# Patient Record
Sex: Female | Born: 1950 | Hispanic: No | Marital: Single | Smoking: Never smoker
Health system: Southern US, Community
[De-identification: ages and names within clinical notes are randomized; demographics above are authoritative.]

## PROBLEM LIST (undated history)

## (undated) DIAGNOSIS — I10 Essential (primary) hypertension: Secondary | ICD-10-CM

## (undated) DIAGNOSIS — E079 Disorder of thyroid, unspecified: Secondary | ICD-10-CM

## (undated) DIAGNOSIS — E7800 Pure hypercholesterolemia, unspecified: Secondary | ICD-10-CM

## (undated) HISTORY — DX: Essential (primary) hypertension: I10

## (undated) HISTORY — DX: Disorder of thyroid, unspecified: E07.9

## (undated) HISTORY — DX: Pure hypercholesterolemia, unspecified: E78.00

---

## 2007-12-05 ENCOUNTER — Other Ambulatory Visit: Admission: RE | Admit: 2007-12-05 | Discharge: 2007-12-05 | Payer: Self-pay | Admitting: General Surgery

## 2008-09-13 ENCOUNTER — Emergency Department (HOSPITAL_COMMUNITY): Admission: EM | Admit: 2008-09-13 | Discharge: 2008-09-13 | Payer: Self-pay | Admitting: Emergency Medicine

## 2013-03-30 ENCOUNTER — Encounter: Payer: Self-pay | Admitting: Podiatry

## 2013-03-30 ENCOUNTER — Ambulatory Visit (INDEPENDENT_AMBULATORY_CARE_PROVIDER_SITE_OTHER): Payer: BC Managed Care – PPO

## 2013-03-30 ENCOUNTER — Ambulatory Visit (INDEPENDENT_AMBULATORY_CARE_PROVIDER_SITE_OTHER): Payer: BC Managed Care – PPO | Admitting: Podiatry

## 2013-03-30 VITALS — BP 124/76 | HR 60 | Resp 16 | Ht 64.0 in | Wt 125.0 lb

## 2013-03-30 DIAGNOSIS — M79609 Pain in unspecified limb: Secondary | ICD-10-CM

## 2013-03-30 DIAGNOSIS — M79671 Pain in right foot: Secondary | ICD-10-CM

## 2013-03-30 DIAGNOSIS — M722 Plantar fascial fibromatosis: Secondary | ICD-10-CM

## 2013-03-30 DIAGNOSIS — L608 Other nail disorders: Secondary | ICD-10-CM

## 2013-03-30 NOTE — Patient Instructions (Signed)
Plantar Fasciitis (Heel Spur Syndrome) with Rehab The plantar fascia is a fibrous, ligament-like, soft-tissue structure that spans the bottom of the foot. Plantar fasciitis is a condition that causes pain in the foot due to inflammation of the tissue. SYMPTOMS   Pain and tenderness on the underneath side of the foot.  Pain that worsens with standing or walking. CAUSES  Plantar fasciitis is caused by irritation and injury to the plantar fascia on the underneath side of the foot. Common mechanisms of injury include:  Direct trauma to bottom of the foot.  Damage to a small nerve that runs under the foot where the main fascia attaches to the heel bone.  Stress placed on the plantar fascia due to bone spurs. RISK INCREASES WITH:   Activities that place stress on the plantar fascia (running, jumping, pivoting, or cutting).  Poor strength and flexibility.  Improperly fitted shoes.  Tight calf muscles.  Flat feet.  Failure to warm-up properly before activity.  Obesity. PREVENTION  Warm up and stretch properly before activity.  Allow for adequate recovery between workouts.  Maintain physical fitness:  Strength, flexibility, and endurance.  Cardiovascular fitness.  Maintain a health body weight.  Avoid stress on the plantar fascia.  Wear properly fitted shoes, including arch supports for individuals who have flat feet. PROGNOSIS  If treated properly, then the symptoms of plantar fasciitis usually resolve without surgery. However, occasionally surgery is necessary. RELATED COMPLICATIONS   Recurrent symptoms that may result in a chronic condition.  Problems of the lower back that are caused by compensating for the injury, such as limping.  Pain or weakness of the foot during push-off following surgery.  Chronic inflammation, scarring, and partial or complete fascia tear, occurring more often from repeated injections. TREATMENT  Treatment initially involves the use of  ice and medication to help reduce pain and inflammation. The use of strengthening and stretching exercises may help reduce pain with activity, especially stretches of the Achilles tendon. These exercises may be performed at home or with a therapist. Your caregiver may recommend that you use heel cups of arch supports to help reduce stress on the plantar fascia. Occasionally, corticosteroid injections are given to reduce inflammation. If symptoms persist for greater than 6 months despite non-surgical (conservative), then surgery may be recommended.  MEDICATION   If pain medication is necessary, then nonsteroidal anti-inflammatory medications, such as aspirin and ibuprofen, or other minor pain relievers, such as acetaminophen, are often recommended.  Do not take pain medication within 7 days before surgery.  Prescription pain relievers may be given if deemed necessary by your caregiver. Use only as directed and only as much as you need.  Corticosteroid injections may be given by your caregiver. These injections should be reserved for the most serious cases, because they may only be given a certain number of times. HEAT AND COLD  Cold treatment (icing) relieves pain and reduces inflammation. Cold treatment should be applied for 10 to 15 minutes every 2 to 3 hours for inflammation and pain and immediately after any activity that aggravates your symptoms. Use ice packs or massage the area with a piece of ice (ice massage).  Heat treatment may be used prior to performing the stretching and strengthening activities prescribed by your caregiver, physical therapist, or athletic trainer. Use a heat pack or soak the injury in warm water. SEEK IMMEDIATE MEDICAL CARE IF:  Treatment seems to offer no benefit, or the condition worsens.  Any medications produce adverse side effects. EXERCISES RANGE   OF MOTION (ROM) AND STRETCHING EXERCISES - Plantar Fasciitis (Heel Spur Syndrome) These exercises may help you  when beginning to rehabilitate your injury. Your symptoms may resolve with or without further involvement from your physician, physical therapist or athletic trainer. While completing these exercises, remember:   Restoring tissue flexibility helps normal motion to return to the joints. This allows healthier, less painful movement and activity.  An effective stretch should be held for at least 30 seconds.  A stretch should never be painful. You should only feel a gentle lengthening or release in the stretched tissue. RANGE OF MOTION - Toe Extension, Flexion  Sit with your right / left leg crossed over your opposite knee.  Grasp your toes and gently pull them back toward the top of your foot. You should feel a stretch on the bottom of your toes and/or foot.  Hold this stretch for __________ seconds.  Now, gently pull your toes toward the bottom of your foot. You should feel a stretch on the top of your toes and or foot.  Hold this stretch for __________ seconds. Repeat __________ times. Complete this stretch __________ times per day.  RANGE OF MOTION - Ankle Dorsiflexion, Active Assisted  Remove shoes and sit on a chair that is preferably not on a carpeted surface.  Place right / left foot under knee. Extend your opposite leg for support.  Keeping your heel down, slide your right / left foot back toward the chair until you feel a stretch at your ankle or calf. If you do not feel a stretch, slide your bottom forward to the edge of the chair, while still keeping your heel down.  Hold this stretch for __________ seconds. Repeat __________ times. Complete this stretch __________ times per day.  STRETCH  Gastroc, Standing  Place hands on wall.  Extend right / left leg, keeping the front knee somewhat bent.  Slightly point your toes inward on your back foot.  Keeping your right / left heel on the floor and your knee straight, shift your weight toward the wall, not allowing your back to  arch.  You should feel a gentle stretch in the right / left calf. Hold this position for __________ seconds. Repeat __________ times. Complete this stretch __________ times per day. STRETCH  Soleus, Standing  Place hands on wall.  Extend right / left leg, keeping the other knee somewhat bent.  Slightly point your toes inward on your back foot.  Keep your right / left heel on the floor, bend your back knee, and slightly shift your weight over the back leg so that you feel a gentle stretch deep in your back calf.  Hold this position for __________ seconds. Repeat __________ times. Complete this stretch __________ times per day. STRETCH  Gastrocsoleus, Standing  Note: This exercise can place a lot of stress on your foot and ankle. Please complete this exercise only if specifically instructed by your caregiver.   Place the ball of your right / left foot on a step, keeping your other foot firmly on the same step.  Hold on to the wall or a rail for balance.  Slowly lift your other foot, allowing your body weight to press your heel down over the edge of the step.  You should feel a stretch in your right / left calf.  Hold this position for __________ seconds.  Repeat this exercise with a slight bend in your right / left knee. Repeat __________ times. Complete this stretch __________ times per day.    STRENGTHENING EXERCISES - Plantar Fasciitis (Heel Spur Syndrome)  These exercises may help you when beginning to rehabilitate your injury. They may resolve your symptoms with or without further involvement from your physician, physical therapist or athletic trainer. While completing these exercises, remember:   Muscles can gain both the endurance and the strength needed for everyday activities through controlled exercises.  Complete these exercises as instructed by your physician, physical therapist or athletic trainer. Progress the resistance and repetitions only as guided. STRENGTH - Towel  Curls  Sit in a chair positioned on a non-carpeted surface.  Place your foot on a towel, keeping your heel on the floor.  Pull the towel toward your heel by only curling your toes. Keep your heel on the floor.  If instructed by your physician, physical therapist or athletic trainer, add ____________________ at the end of the towel. Repeat __________ times. Complete this exercise __________ times per day. STRENGTH - Ankle Inversion  Secure one end of a rubber exercise band/tubing to a fixed object (table, pole). Loop the other end around your foot just before your toes.  Place your fists between your knees. This will focus your strengthening at your ankle.  Slowly, pull your big toe up and in, making sure the band/tubing is positioned to resist the entire motion.  Hold this position for __________ seconds.  Have your muscles resist the band/tubing as it slowly pulls your foot back to the starting position. Repeat __________ times. Complete this exercises __________ times per day.  Document Released: 04/02/2005 Document Revised: 06/25/2011 Document Reviewed: 07/15/2008 Exit[employer] Patient Information 2014 [employer]. Plantar Fasciitis Plantar fasciitis is a common condition that causes foot pain. It is soreness (inflammation) of the band of tough fibrous tissue on the bottom of the foot that runs from the heel bone (calcaneus) to the ball of the foot. The cause of this soreness may be from excessive standing, poor fitting shoes, running on hard surfaces, being overweight, having an abnormal walk, or overuse (this is common in runners) of the painful foot or feet. It is also common in aerobic exercise dancers and ballet dancers. SYMPTOMS  Most people with plantar fasciitis complain of:  Severe pain in the morning on the bottom of their foot especially when taking the first steps out of bed. This pain recedes after a few minutes of walking.  Severe pain is experienced also during walking  following a long period of inactivity.  Pain is worse when walking barefoot or up stairs DIAGNOSIS   Your caregiver will diagnose this condition by examining and feeling your foot.  Special tests such as X-rays of your foot, are usually not needed. PREVENTION   Consult a sports medicine professional before beginning a new exercise program.  Walking programs offer a good workout. With walking there is a lower chance of overuse injuries common to runners. There is less impact and less jarring of the joints.  Begin all new exercise programs slowly. If problems or pain develop, decrease the amount of time or distance until you are at a comfortable level.  Wear good shoes and replace them regularly.  Stretch your foot and the heel cords at the back of the ankle (Achilles tendon) both before and after exercise.  Run or exercise on even surfaces that are not hard. For example, asphalt is better than pavement.  Do not run barefoot on hard surfaces.  If using a treadmill, vary the incline.  Do not continue to workout if you have foot or joint   problems. Seek professional help if they do not improve. HOME CARE INSTRUCTIONS   Avoid activities that cause you pain until you recover.  Use ice or cold packs on the problem or painful areas after working out.  Only take over-the-counter or prescription medicines for pain, discomfort, or fever as directed by your caregiver.  Soft shoe inserts or athletic shoes with air or gel sole cushions may be helpful.  If problems continue or become more severe, consult a sports medicine caregiver or your own health care provider. Cortisone is a potent anti-inflammatory medication that may be injected into the painful area. You can discuss this treatment with your caregiver. MAKE SURE YOU:   Understand these instructions.  Will watch your condition.  Will get help right away if you are not doing well or get worse. Document Released: 12/26/2000 Document  Revised: 06/25/2011 Document Reviewed: 02/25/2008 Exit[employer] Patient Information 2014 [employer].  

## 2013-03-30 NOTE — Progress Notes (Signed)
   Subjective:    Patient ID: Haley Lucas, female    DOB: [birth year], 62 y.o.   MRN: [id number]  HPI Comments: Fungus on both great toenails and pain in heel   N tender and sore , discoloring in toenails  L right plantar heel , both great toenails D year                         2 years  O gradual  C about the same  A when on it ,it hurts ,  T no treatment, no treatment for nails   Foot Pain  pt did not bring in medication list, she currently taking meds for thyroid and high blood pressure and also cholesterol    Review of Systems  Neurological:       Vertigo   All other systems reviewed and are negative.       Objective:   Physical Exam: I have reviewed her past medical history medications allergies surgical history social history family history and review of systems. Vital signs are stable she is alert and oriented x3.  Pulses are strongly palpable bilateral +2/4 DPPT capillary fill time to digits one through 5 of the bilateral foot is noted to be immediate. Neurologic sensorium is intact per Semmes-Weinstein monofilament. Deep tendon reflexes are brisk and equal bilateral. Muscle strength + over 5 dorsiflexors plantar flexors inverters everters all intrinsic musculature is intact. Orthopedic evaluation demonstrates mild tenderness on palpation right heel. Rectus foot type bilateral. Cutaneous evaluation demonstrates supple well hydrated cutis no erythema edema saline is drainage or odor. Hallux nail plates bilateral do demonstrate discoloration and thickening the medial margins. Radiographic evaluation right foot demonstrates soft tissue increase in density at the plantar fascial calcaneal insertion site with a small plantar distally oriented calcaneal heel spur.        Assessment & Plan:  Assessment: Mild plantar fasciitis right. Nail dystrophy hallux bilateral rule out onychomycosis.  Plan: Offered her an injection today which she declined. I also sample of her nail and skin  from each hallux is sent out from a mycology study.

## 2013-04-23 ENCOUNTER — Telehealth: Payer: Self-pay | Admitting: Podiatry

## 2013-04-23 ENCOUNTER — Encounter: Payer: Self-pay | Admitting: Podiatry

## 2013-04-23 NOTE — Telephone Encounter (Signed)
Left patient a message to schedule an appointment for her lab results. SD

## 2013-05-04 ENCOUNTER — Encounter: Payer: Self-pay | Admitting: Podiatry

## 2013-05-04 ENCOUNTER — Ambulatory Visit (INDEPENDENT_AMBULATORY_CARE_PROVIDER_SITE_OTHER): Payer: BC Managed Care – PPO | Admitting: Podiatry

## 2013-05-04 VITALS — BP 104/65 | HR 64 | Resp 16

## 2013-05-04 DIAGNOSIS — B351 Tinea unguium: Secondary | ICD-10-CM

## 2013-05-04 DIAGNOSIS — Z79899 Other long term (current) drug therapy: Secondary | ICD-10-CM

## 2013-05-04 NOTE — Progress Notes (Signed)
Result of the nail.  Objective: Vital signs are stable she is alert and oriented x3. She demonstrates to me the lateral border of her hallux nail right does demonstrate onychomycosis. I did discuss with her in great detail today her pathology report did come back positive for fungus as well as molds. We discussed the oral medications as well as topicals.  Assessment: Onychomycosis.  Plan: We discussed in great detail today he oral and the oral therapies. She declines any therapy at all.

## 2017-06-26 ENCOUNTER — Other Ambulatory Visit: Payer: Self-pay

## 2017-06-26 ENCOUNTER — Encounter: Payer: Self-pay | Admitting: Physical Therapy

## 2017-06-26 ENCOUNTER — Encounter: Payer: Self-pay | Admitting: Occupational Therapy

## 2017-06-26 ENCOUNTER — Ambulatory Visit: Payer: Medicare Other | Admitting: Occupational Therapy

## 2017-06-26 ENCOUNTER — Ambulatory Visit: Payer: Medicare Other | Attending: Family Medicine | Admitting: Physical Therapy

## 2017-06-26 DIAGNOSIS — R2681 Unsteadiness on feet: Secondary | ICD-10-CM | POA: Insufficient documentation

## 2017-06-26 DIAGNOSIS — H543 Unqualified visual loss, both eyes: Secondary | ICD-10-CM | POA: Diagnosis present

## 2017-06-26 DIAGNOSIS — M6281 Muscle weakness (generalized): Secondary | ICD-10-CM

## 2017-06-26 DIAGNOSIS — R278 Other lack of coordination: Secondary | ICD-10-CM | POA: Diagnosis present

## 2017-06-26 DIAGNOSIS — R262 Difficulty in walking, not elsewhere classified: Secondary | ICD-10-CM

## 2017-06-26 NOTE — Therapy (Signed)
[facility] [facility] [address] [place], [place], [address] Phone: [phone]   Fax:  [phone]  Physical Therapy Evaluation  Patient Details  Name: Haley LemonsHyun S Iversen MRN: [id number] Date of Birth: 06/14/1950 Referring Provider: Marshell GarfinkelO, SARAH J    Encounter Date: 06/26/2017  PT End of Session - 06/26/17 0941    Visit Number  1    Number of Visits  17    Date for PT Re-Evaluation  08/21/17    PT Start Time  0940    PT Stop Time  1030    PT Time Calculation (min)  50 min    Equipment Utilized During Treatment  Gait belt    Activity Tolerance  Patient tolerated treatment well    Behavior During Therapy  [facility] for tasks assessed/performed       Past Medical History:  Diagnosis Date  . High cholesterol   . Hypertension   . Thyroid disease     History reviewed. No pertinent surgical history.  There were no vitals filed for this visit.   Subjective Assessment - 06/26/17 0943    Subjective  Patient had surgery Feb 26, 2018.     Pertinent History  She had a hughes flap to her eye right eye feb 18th at [facility]. She was in the hospital for a month from the first surgey and was discharged Dec 11th. She had PT and OT for  2 weeks and then [facility] PT, OT and ST.  Tha ended christmas. She was not using a Assistive devie prior to surgery. She has weakness in her arms and she is not able to walk her normal distances.     Limitations  Standing    How long can you stand comfortably?  10 mins    Patient Stated Goals  to be able to walk and stand for longer periods of time, and improve balance.     Currently in Pain?  No/denies    Multiple Pain Sites  No         [facility] PT Assessment - 06/26/17 0936      Assessment   Medical Diagnosis  Acoustic Neuroma    Referring Provider  [facility]     Onset Date/Surgical Date  02/26/17    Hand Dominance  Right    Next MD Visit  07/2017    Prior Therapy  [facility], inpatient rehab      Precautions   Precautions  None      Restrictions   Weight Bearing Restrictions  No      Balance Screen   Has the patient fallen in the past 6 months  No    Has the patient had a decrease in activity level because of a fear of falling?   Yes    Is the patient reluctant to leave their home because of a fear of falling?   No      Home Public house managernvironment   Living Environment  Private residence    Living Arrangements  Spouse/significant other    Available Help at Discharge  Family    Type of Home  House    Home Access  Stairs to enter    Entrance Stairs-Number of Steps  3    Entrance Stairs-Rails  Right    Home Layout  One level    Home Equipment  Walker - 2 wheels;Cane - single point      Prior Function   Level of Independence  Independent  Vocation  Retired    Leisure  Advertising account planner Status  Within [employer] for tasks assessed    Attention  Focused       PAIN: shoulder pain bilaterally during bed mobility   POSTURE: WNL   PROM/AROM: WNL  STRENGTH:  Graded on a 0-5 scale Muscle Group Left Right                          Hip Flex -3/5 -3/5  Hip Abd -3/5 -3/5  Hip Add -3/5 -3/5  Hip Ext -3/5 -3/5  Hip IR/ER 3/5 3/5  Knee Flex 5/5 5/5  Knee Ext 5/5 5/5  Ankle DF 5/5 5/5  Ankle PF 5/5 5/5   SENSATION: numbness in right side of face and tongue   FUNCTIONAL MOBILITY: Difficulty with rolling due to UE weakness and UE pain, modified independent   BALANCE: Static Standing Balance  Normal Able to maintain standing balance against maximal resistance   Good Able to maintain standing balance against moderate resistance   Good-/Fair+ Able to maintain standing balance against minimal resistance x  Fair Able to stand unsupported without UE support and without LOB for 1-2 min   Fair- Requires Min A and UE support to maintain standing without loss of balance   Poor+ Requires mod A and UE support to maintain standing without loss of balance   Poor  Requires max A and UE support to maintain standing balance without loss    Standing Dynamic Balance  Normal Stand independently unsupported, able to weight shift and cross midline maximally   Good Stand independently unsupported, able to weight shift and cross midline moderately   Good-/Fair+ Stand independently unsupported, able to weight shift across midline minimally x  Fair Stand independently unsupported, weight shift, and reach ipsilaterally, loss of balance when crossing midline   Poor+ Able to stand with Min A and reach ipsilaterally, unable to weight shift   Poor Able to stand with Mod A and minimally reach ipsilaterally, unable to cross midline.       GAIT: Patient ambulates with slow gait speed and path deviation with turns or head turns; no assistive device 1 step ambulation for ascending and descending steps  OUTCOME MEASURES: TEST Outcome Interpretation  5 times sit<>stand 29.69sec >83 yo, >15 sec indicates increased risk for falls  10 meter walk test      1.12           m/s <1.0 m/s indicates increased risk for falls; limited community ambulator  Timed up and Go  19.67               sec <14 sec indicates increased risk for  falls  6 minute walk test    880            Feet 1000 feet is community ambulator            Treatment Standing on foam with head turns x  1 min Standing on level surface with head turns and narrow base of support x 1 min Standing on foam and level surfaces in parallel bars with balance challenges and CGA x 2 mins Safety with transfers during various heights of sit to stand and education for mobility Reviewed goals and plan of care and balance deficits and outcome measures.  PT Education - 06/26/17 0940    Education provided  Yes    Education Details  plan of care    Person(s) Educated  Patient    Methods  Explanation    Comprehension  Verbalized understanding       PT Short Term Goals - 06/26/17 1029       PT SHORT TERM GOAL #1   Title  Patient will be independent in home exercise program to improve strength/mobility for better functional independence with ADLs.    Time  4    Period  Weeks    Status  New    Target Date  07/24/17      PT SHORT TERM GOAL #2   Title  Patient (> 36 years old) will complete five times sit to stand test in < 15 seconds indicating an increased LE strength and improved balance.    Time  4    Period  Weeks    Target Date  07/24/17        PT Long Term Goals - 06/26/17 1030      PT LONG TERM GOAL #1   Title  Patient will increase six minute walk test distance to >1000 for progression to community ambulator and improve gait ability    Time  8    Period  Weeks    Status  New    Target Date  08/21/17      PT LONG TERM GOAL #2   Title  Patient will increase BLE gross strength to 4+/5 as to improve functional strength for independent gait, increased standing tolerance and increased ADL ability.    Time  8    Period  Weeks    Status  New    Target Date  08/21/17      PT LONG TERM GOAL #3   Title  Patient will ascend/descend 4 stairs without rail assist independently without loss of balance to improve ability to get in/out of home.     Time  8    Period  Weeks    Status  New    Target Date  08/21/17      PT LONG TERM GOAL #4   Title  Patient will reduce timed up and go to <11 seconds to reduce fall risk and demonstrate improved transfer/gait ability.    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 She has BLE hip weakness, decreased gait speed, decreased static and dynamic standing balance. She has decreased outcome measures indicating a falls risk. She has decreased hearing in right ear, numbness in right side of face and tongue. She will benefit from skilled PT to improve strength, balance and  gait to return to prior level of function.    Clinical Presentation  Stable    Clinical Decision Making  Low    Rehab Potential  Good    PT Frequency  2x / week    PT Duration  8 weeks    PT Treatment/Interventions  Gait training;Therapeutic exercise;Therapeutic activities;Stair training;Balance training;Neuromuscular re-education;Patient/family education;Manual techniques;Aquatic Therapy;Moist Heat;Electrical Stimulation    PT Next Visit Plan  balance and therapeutic exercise for hip weakness    Consulted and Agree with Plan of Care  Patient;Family member/caregiver  Patient will benefit from skilled therapeutic intervention in order to improve the following deficits and impairments:  Abnormal gait, Decreased balance, Decreased endurance, Decreased mobility, Difficulty walking, Decreased knowledge of precautions, Decreased activity tolerance, Decreased coordination, Decreased safety awareness, Decreased strength, Impaired flexibility, Postural dysfunction  Visit Diagnosis: Unsteadiness on feet  Difficulty in walking, not elsewhere classified  Muscle weakness (generalized)     Problem List There are no active problems to display for this patient.   [address], [place] DPT 06/26/2017, 11:30 AM  [place] [facility] MAIN [facility] SERVICES [address] [place], [place], [address] Phone: [phone]   Fax:  [phone]  Name: RAMON BRANT MRN: [id number] Date of Birth: [birth year]

## 2017-06-26 NOTE — Therapy (Signed)
[place] [facility] MAIN [facility] SERVICES [address] [place], [place], [address] Phone: [phone]   Fax:  [phone]  Occupational Therapy Evaluation  Patient Details  Name: Haley Lucas MRN: [id number] Date of Birth: 23-Oct-1950 Referring Provider: Marshell Garfinkel    Encounter Date: 06/26/2017  OT End of Session - 06/26/17 1100    Visit Number  1    Number of Visits  24    Date for OT Re-Evaluation  09/18/17    OT Start Time  0832    OT Stop Time  0937    OT Time Calculation (min)  65 min    Activity Tolerance  Patient tolerated treatment well    Behavior During Therapy  [facility] for tasks assessed/performed       Past Medical History:  Diagnosis Date  . High cholesterol   . Hypertension   . Thyroid disease     History reviewed. No pertinent surgical history.  There were no vitals filed for this visit.  Subjective Assessment - 06/26/17 1048    Subjective   Pt. was present with her daughter, an interpreter, and her husband.    Pertinent History  Pt is a 67 y.o. 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 Pt. received inpatient rehabilitation services followed by home health services. Pt. had surgery to repair a right drooping eyelid on February 19th., 2019. Pt. is ready for outpatient OT services.    Patient Stated Goals  OT services    Currently in Pain?  No/denies    Multiple Pain Sites  No        [facility] OT Assessment - 06/26/17 0001      Assessment   Medical Diagnosis  Acoustic Neuroma    Referring Provider  Dr. Minerva Areola    Onset Date/Surgical Date  02/26/17    Next MD Visit  07/2017      Balance Screen   Has the patient fallen in the past 6 months  No    Has the patient had a decrease in activity level because of a fear of falling?   No    Is the patient reluctant to leave their home because of a fear of falling?   No      Home  Environment   Family/patient expects to be discharged to:  Private residence     Living Arrangements  Spouse/significant other    Type of Home  House    Home Layout  One level    Bathroom Accessibility  Yes    Home Equipment  [place] - 2 wheels;[place] - single point      Prior Function   Level of Independence  Independent    Vocation  -- Runner, broadcasting/film/video    Leisure  gardening      ADL   Eating/Feeding  Independent difficulty with cutting meat    Grooming  Independent    Lower Body Bathing  Increased time    Financial trader -  Geneticist, molecular  Independent      IADL   Shopping  Completely unable to shop    Light Housekeeping  Needs help with all home maintenance tasks    Meal Prep  Able to complete simple warm meal prep    Medication Management  Is responsible for taking medication  in correct dosages at correct time    Financial Management  Requires assistance      Written Expression   Dominant Hand  Right      Vision - History   Baseline Vision  Wears glasses all the time    Visual History  Cataracts    Additional Comments  Pt. had surgery for right eyelid droop 06/04/2017      Vision Assessment   Vision Assessment  Vision impaired  _ to be further tested in functional context    Diplopia Assessment  Disappears with one eye closed    Patient has diffculty with activities due to visual impairment  -- Reading      Activity Tolerance   Activity Tolerance  Tolerates 10-20 min activity with multiple rests      Cognition   Overall Cognitive Status  Impaired/Different from baseline slower    Memory  Impaired      Sensation   Light Touch  Appears Intact    Proprioception  Appears Intact      Coordination   Right 9 Hole Peg Test  47 Pt. required frequent cues proper technique    Left 9 Hole Peg Test  28 28      Strength   Overall Strength Comments  BUE strength: Shoulder flexion 4-/5, abduction 3+/5, elbow flexion extension 4/5,  wrist extension 4/5       Hand Function   Right Hand Grip (lbs)  36#    Right Hand Lateral Pinch  12 lbs    Right Hand 3 Point Pinch  7 lbs    Left Hand Grip (lbs)  34#    Left Hand Lateral Pinch  11 lbs    Left 3 point pinch  9 lbs                      OT Education - 06/26/17 1153    Education provided  Yes    Education Details  UE strength, and coordination skills    Person(s) Educated  Patient    Methods  Explanation    Comprehension  Verbalized understanding          OT Long Term Goals - 06/26/17 1205      OT LONG TERM GOAL #1   Title  Pt. will increase UE strength by 2 mm grades to assist with ADLs, and IADL    Baseline  Eval: Limited BUE strength    Time  12    Period  Weeks    Status  New    Target Date  09/18/17      OT LONG TERM GOAL #2   Title  Pt. will improve right hand [facility] skills by 3 sec. to be able to manipulate ADL items.    Baseline  Eval: Right: 47 sec.    Time  12    Period  Weeks    Status  New    Target Date  09/18/17      OT LONG TERM GOAL #3   Title  Pt. will demonstrate visual compensatory strategies 100% of the time during ADLs, and IADLs.    Baseline  EVal: Pt. unable    Time  12    Period  Weeks    Status  New    Target Date  09/18/17      OT LONG TERM GOAL #4   Title  Pt. will demonstrate cognitive compensatory strategies 100% of the time during ADLs, and IADLs.  Baseline  Eval: Pt. is unable    Time  12    Period  Weeks    Status  New    Target Date  09/18/17      OT LONG TERM GOAL #5   Title  Pt. will complete IADL, home management tasks with Supervision.     Baseline  Eval: Dependent    Time  12    Period  Weeks    Status  New    Target Date  09/18/17            Plan - 06/26/17 1101    Clinical Impression Statement  Pt. is a 67 y.o. 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 had surgery for eyelid droop in February of 2019. Pt. presents with weakness, limited  functional mobility, limited balance, pain in bilateral shoulders with changes in position at night, impaired vision, and cogniitve limitations which limit her ability to complete ADL, and IADL functioning. Pt. will benefit from skilled OT services to work on improving  UE strength, and coordination skills, and provide pt. education about visual, and cognitive compensatory strategies for improved functional performance, and engagement in functional ADL, and IADL tasks.    Occupational Profile and client history currently impacting functional performance  Pt. 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    Occupational performance deficits (Please refer to evaluation for details):  ADL's;IADL's    Rehab Potential  Good    Current Impairments/barriers affecting progress:  Positive indicators: age, family support, motivation, Negative indicators: multiple comorbidities.    OT Frequency  2x / week    OT Duration  12 weeks    OT Treatment/Interventions  Self-care/ADL training;Neuromuscular education;Therapeutic activities;Cognitive remediation/compensation;Passive range of motion;DME and/or AE instruction;Patient/family education;Energy conservation;Therapeutic exercise    Clinical Decision Making  Multiple treatment options, significant modification of task necessary    Consulted and Agree with Plan of Care  Patient       Patient will benefit from skilled therapeutic intervention in order to improve the following deficits and impairments:  Pain, Impaired UE functional use, Decreased knowledge of precautions, Decreased cognition, Impaired tone, Decreased strength, Decreased endurance, Decreased activity tolerance, Decreased knowledge of use of DME, Decreased balance  Visit Diagnosis: Muscle weakness (generalized)  Other lack of coordination  Low vision, both eyes    Problem List There are no active problems to display for this patient.   [name], MS,  OTR/L 06/26/2017, 12:12 PM  [facility] MAIN [facility] SERVICES [address] [place], [place], [address] Phone: [phone]   Fax:  [phone]  Name: Haley Lucas MRN: [id number] Date of Birth: [birth year]

## 2017-07-01 ENCOUNTER — Encounter: Payer: Self-pay | Admitting: Occupational Therapy

## 2017-07-01 ENCOUNTER — Ambulatory Visit: Payer: Medicare Other | Admitting: Physical Therapy

## 2017-07-01 ENCOUNTER — Ambulatory Visit: Payer: Medicare Other | Admitting: Occupational Therapy

## 2017-07-01 ENCOUNTER — Encounter: Payer: Self-pay | Admitting: Physical Therapy

## 2017-07-01 DIAGNOSIS — R262 Difficulty in walking, not elsewhere classified: Secondary | ICD-10-CM

## 2017-07-01 DIAGNOSIS — M6281 Muscle weakness (generalized): Secondary | ICD-10-CM

## 2017-07-01 DIAGNOSIS — R2681 Unsteadiness on feet: Secondary | ICD-10-CM

## 2017-07-01 DIAGNOSIS — H543 Unqualified visual loss, both eyes: Secondary | ICD-10-CM

## 2017-07-01 DIAGNOSIS — R278 Other lack of coordination: Secondary | ICD-10-CM

## 2017-07-01 NOTE — Therapy (Signed)
[place] [facility] MAIN [facility] SERVICES [address] [place], [place], [address] Phone: [phone]   Fax:  [phone]  Occupational Therapy Treatment  Patient Details  Name: Haley Lucas MRN: [id number] Date of Birth: 1950-11-10 Referring Provider: Marshell Garfinkel    Encounter Date: 07/01/2017  OT End of Session - 07/01/17 0852    Visit Number  2    Number of Visits  24    Date for OT Re-Evaluation  09/18/17    OT Start Time  0845    OT Stop Time  0930    OT Time Calculation (min)  45 min    Activity Tolerance  Patient tolerated treatment well    Behavior During Therapy  [facility] for tasks assessed/performed       Past Medical History:  Diagnosis Date  . High cholesterol   . Hypertension   . Thyroid disease     History reviewed. No pertinent surgical history.  There were no vitals filed for this visit.  Subjective Assessment - 07/01/17 0851    Pertinent History  Pt is a 67 y.o. 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 Pt. received inpatient rehabilitation services followed by home health services. Pt. had surgery to repair a right drooping eyelid on February 19th., 2019. Pt. is ready for outpatient OT services.    Patient Stated Goals  OT services    Currently in Pain?  Yes    Pain Score  0-No pain      OT TREATMENT    Therapeutic Exercise:  Pt. Worked on BUE there. Ex. With red theraband for bilateral elbow flexion and extension. 1-2 sets 20 reps. 2# dumbbell ex. for elbow flexion and extension, forearm supination/pronation, wrist flexion/extension, and radial deviation. Pt. requires rest breaks and verbal cues for proper technique. Pt. performed gross gripping with grip strengthener. Pt. worked on sustaining grip while grasping pegs and reaching at various heights. Gripper was placed in the resistive slot with the white resistive spring.  Selfcare:  Pt. education was provided about positioning for nighttime  secondary to bilateral shoulder pain at night only.                           OT Education - 07/01/17 [id number]    Education provided  Yes    Education Details  positioning for sleeping    Person(s) Educated  Patient    Methods  Explanation    Comprehension  Verbalized understanding;Returned demonstration          OT Long Term Goals - 06/26/17 1205      OT LONG TERM GOAL #1   Title  Pt. will increase UE strength by 2 mm grades to assist with ADLs, and IADL    Baseline  Eval: Limited BUE strength    Time  12    Period  Weeks    Status  New    Target Date  09/18/17      OT LONG TERM GOAL #2   Title  Pt. will improve right hand [facility] skills by 3 sec. to be able to manipulate ADL items.    Baseline  Eval: Right: 47 sec.    Time  12    Period  Weeks    Status  New    Target Date  09/18/17      OT LONG TERM GOAL #3   Title  Pt. will demonstrate visual compensatory strategies  100% of the time during ADLs, and IADLs.    Baseline  EVal: Pt. unable    Time  12    Period  Weeks    Status  New    Target Date  09/18/17      OT LONG TERM GOAL #4   Title  Pt. will demonstrate cognitive compensatory strategies 100% of the time during ADLs, and IADLs.    Baseline  Eval: Pt. is unable    Time  12    Period  Weeks    Status  New    Target Date  09/18/17      OT LONG TERM GOAL #5   Title  Pt. will complete IADL, home management tasks with Supervision.     Baseline  Eval: Dependent    Time  12    Period  Weeks    Status  New    Target Date  09/18/17            Plan - 07/01/17 0853    Clinical Impression Statement  Pt. reports she is feeling better overall. Pt. reports no pain during the treatment session, however has pain at night in her bilateral shoulders. Pt. education was provided about positioning.  Pt. reports vision is better, however her face feels very heavy on the right side.    Occupational Profile and client history currently impacting  functional performance  Pt. 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    Occupational performance deficits (Please refer to evaluation for details):  ADL's;IADL's    Rehab Potential  Good    Current Impairments/barriers affecting progress:  Positive indicators: age, family support, motivation, Negative indicators: multiple comorbidities.    OT Frequency  2x / week    OT Duration  12 weeks    OT Treatment/Interventions  Self-care/ADL training;Neuromuscular education;Therapeutic activities;Cognitive remediation/compensation;Passive range of motion;DME and/or AE instruction;Patient/family education;Energy conservation;Therapeutic exercise    Clinical Decision Making  Multiple treatment options, significant modification of task necessary    Consulted and Agree with Plan of Care  Patient       Patient will benefit from skilled therapeutic intervention in order to improve the following deficits and impairments:  Pain, Impaired UE functional use, Decreased knowledge of precautions, Decreased cognition, Impaired tone, Decreased strength, Decreased endurance, Decreased activity tolerance, Decreased knowledge of use of DME, Decreased balance  Visit Diagnosis: Muscle weakness (generalized)    Problem List There are no active problems to display for this patient.   Olegario MessierElaine Shawnika Pepin, MS, OTR/L 07/01/2017, 9:43 AM  [facility] MAIN [facility] [address] [place], [place], [address] Phone: [phone]   Fax:  [phone]  Name: Haley LemonsHyun S Schweitzer MRN: [id number] Date of Birth: 06/19/1950

## 2017-07-01 NOTE — Therapy (Signed)
[place] [facility] [address] [place], [place], [address] Phone: [phone]   Fax:  [phone]  Physical Therapy Treatment  Patient Details  Name: Haley LemonsHyun S Correa MRN: [id number] Date of Birth: 02/08/1951 Referring Provider: Marshell GarfinkelO, SARAH J    Encounter Date: 07/01/2017  PT End of Session - 07/01/17 0814    Visit Number  2    Number of Visits  17    Date for PT Re-Evaluation  08/21/17    PT Start Time  0805    PT Stop Time  0845    PT Time Calculation (min)  40 min    Equipment Utilized During Treatment  Gait belt    Activity Tolerance  Patient tolerated treatment well    Behavior During Therapy  [facility] for tasks assessed/performed       Past Medical History:  Diagnosis Date  . High cholesterol   . Hypertension   . Thyroid disease     History reviewed. No pertinent surgical history.  There were no vitals filed for this visit.  Subjective Assessment - 07/01/17 0812    Subjective  patient reports that her shoulders are sore and they hurt at night when she tries to roll over.     Currently in Pain?  Yes    Pain Score  3     Pain Location  Shoulder    Pain Orientation  Right;Left    Pain Descriptors / Indicators  Aching    Pain Onset  More than a month ago    Pain Frequency  Intermittent    Aggravating Factors   rolling on her shoulders at night    Pain Relieving Factors  not raising them above her shoulder height    Effect of Pain on Daily Activities  does not use her arms for reaching    Multiple Pain Sites  No         Treatment: Warm up on nu-step x 5 mins  Supine: SAQ 2 lbs and 3 sec hold x 20 BLE BLE knee to chest x 10 x 2 with 2 lbs SLR with 2 lbs x 10 x 2 BLE Hip abd supine with w2 lb due to shoulder pain with sidelying  x 15 x 2 BLE Bridging x 10 x 2   Sitting:  LAQ with 2 lbs x 20 BLE Hip flex with 2 lbs x 20 BLE   Standing: Squats x 20 Heel raises x 20  Hip extension , 2 lbs x 20 , cues to keep her  knee straight and trunk straight and not rocking fwd Hip abd, 2 lb , x 20   Neuromuscular training : Standing on foam with eye closed x 30 x 2 Standing on foam with NBOS and head turns x 1 min Standing on foam and reaching across midline with cones and stacking them on opposite side   CGA and Min to mod verbal cues used throughout with increased in postural sway and LOB most seen with narrow base of support and while on uneven surfaces. Continues to have balance deficits typical with diagnosis. Patient performs intermediate level exercises without pain behaviors and needs verbal cuing for postural alignment and head positioning She does not tolerate sidelying position and performed hip abd in supine with 2 lbs weights to not aggravate her shoulder pain.                      PT Education - 07/01/17 [id number]  Education provided  Yes    Education Details  HEP    Person(s) Educated  Patient    Methods  Explanation;Tactile cues;Verbal cues;Demonstration    Comprehension  Verbalized understanding;Returned demonstration       PT Short Term Goals - 06/26/17 1029      PT SHORT TERM GOAL #1   Title  Patient will be independent in home exercise program to improve strength/mobility for better functional independence with ADLs.    Time  4    Period  Weeks    Status  New    Target Date  07/24/17      PT SHORT TERM GOAL #2   Title  Patient (> 51 years old) will complete five times sit to stand test in < 15 seconds indicating an increased LE strength and improved balance.    Time  4    Period  Weeks    Target Date  07/24/17        PT Long Term Goals - 06/26/17 1030      PT LONG TERM GOAL #1   Title  Patient will increase six minute walk test distance to >1000 for progression to community ambulator and improve gait ability    Time  8    Period  Weeks    Status  New    Target Date  08/21/17      PT LONG TERM GOAL #2   Title  Patient will increase BLE gross strength to  4+/5 as to improve functional strength for independent gait, increased standing tolerance and increased ADL ability.    Time  8    Period  Weeks    Status  New    Target Date  08/21/17      PT LONG TERM GOAL #3   Title  Patient will ascend/descend 4 stairs without rail assist independently without loss of balance to improve ability to get in/out of home.     Time  8    Period  Weeks    Status  New    Target Date  08/21/17      PT LONG TERM GOAL #4   Title  Patient will reduce timed up and go to <11 seconds to reduce fall risk and demonstrate improved transfer/gait ability.    Time  8    Period  Weeks    Status  New    Target Date  08/21/17            Plan - 07/01/17 0817    Clinical Impression Statement  Patient instructed in intermediate strengthening and balance exercise.  Patient requires min Vcs for correct exercise technique including to improve trunk control with standing exercise. Patient demonstrates better quad control with SLS tasks with rail assist. Patient would benefit from additional skilled PT intervention to improve balance/gait safety and reduce fall risk.    Rehab Potential  Good    PT Frequency  2x / week    PT Duration  8 weeks    PT Treatment/Interventions  Gait training;Therapeutic exercise;Therapeutic activities;Stair training;Balance training;Neuromuscular re-education;Patient/family education;Manual techniques;Aquatic Therapy;Moist Heat;Electrical Stimulation    PT Next Visit Plan  balance and therapeutic exercise for hip weakness    Consulted and Agree with Plan of Care  Patient;Family member/caregiver       Patient will benefit from skilled therapeutic intervention in order to improve the following deficits and impairments:  Abnormal gait, Decreased balance, Decreased endurance, Decreased mobility, Difficulty walking, Decreased knowledge of precautions, Decreased activity tolerance, Decreased  coordination, Decreased safety awareness, Decreased  strength, Impaired flexibility, Postural dysfunction  Visit Diagnosis: Muscle weakness (generalized)  Other lack of coordination  Low vision, both eyes  Unsteadiness on feet  Difficulty in walking, not elsewhere classified     Problem List There are no active problems to display for this patient.   [address], [place] DPT 07/01/2017, 8:19 AM  [place] [facility] MAIN [facility] SERVICES [address] [place], [place], [address] Phone: [phone]   Fax:  [phone]  Name: SHANEEN REESER MRN: [id number] Date of Birth: 1950/11/15

## 2017-07-03 ENCOUNTER — Encounter: Payer: Self-pay | Admitting: Occupational Therapy

## 2017-07-03 ENCOUNTER — Ambulatory Visit: Payer: Medicare Other | Admitting: Occupational Therapy

## 2017-07-03 ENCOUNTER — Ambulatory Visit: Payer: Medicare Other | Admitting: Physical Therapy

## 2017-07-03 ENCOUNTER — Encounter: Payer: Self-pay | Admitting: Physical Therapy

## 2017-07-03 DIAGNOSIS — R262 Difficulty in walking, not elsewhere classified: Secondary | ICD-10-CM

## 2017-07-03 DIAGNOSIS — M6281 Muscle weakness (generalized): Secondary | ICD-10-CM

## 2017-07-03 DIAGNOSIS — R278 Other lack of coordination: Secondary | ICD-10-CM

## 2017-07-03 DIAGNOSIS — R2681 Unsteadiness on feet: Secondary | ICD-10-CM

## 2017-07-03 DIAGNOSIS — H543 Unqualified visual loss, both eyes: Secondary | ICD-10-CM

## 2017-07-03 NOTE — Therapy (Signed)
[place] [facility] [address] [place], [place], [address] Phone: [phone]   Fax:  [phone]  Physical Therapy Treatment  Patient Details  Name: Haley LemonsHyun S Trethewey MRN: [id number] Date of Birth: 06/04/1950 Referring Provider: Marshell GarfinkelO, SARAH J    Encounter Date: 07/03/2017  PT End of Session - 07/03/17 0807    Visit Number  3    Number of Visits  17    Date for PT Re-Evaluation  08/21/17    PT Start Time  0801    PT Stop Time  0840    PT Time Calculation (min)  39 min    Equipment Utilized During Treatment  Gait belt    Activity Tolerance  Patient tolerated treatment well;Patient limited by fatigue    Behavior During Therapy  [facility] for tasks assessed/performed       Past Medical History:  Diagnosis Date  . High cholesterol   . Hypertension   . Thyroid disease     History reviewed. No pertinent surgical history.  There were no vitals filed for this visit.  Subjective Assessment - 07/03/17 0806    Subjective  patient reports that her shoulders are sore and they hurt at night when she tries to roll over.     Pertinent History  She had a hughes flap to her eye right eye feb 18th at [facility]. She was in the hospital for a month from the first surgey and was discharged Dec 11th. She had PT and OT for  2 weeks and then HHPT PT, OT and ST.  Tha ended christmas. She was not using a Assistive devie prior to surgery. She has weakness in her arms and she is not able to walk her normal distances.     Limitations  Standing    How long can you stand comfortably?  10 mins    Patient Stated Goals  to be able to walk and stand for longer periods of time, and improve balance.     Currently in Pain?  Yes    Pain Score  4     Pain Location  Shoulder    Pain Orientation  Right;Left    Pain Descriptors / Indicators  Aching    Pain Type  Chronic pain    Pain Onset  More than a month ago    Pain Frequency  Intermittent        Treatment: Warm  up octane fitness x 5 mins (warm up)  standing hip abd, hip extension , hip flex  with YTB x 20  , cues to keep her shoulders upright  side stepping left and right with no hands for support on blue balance foam in parallel bars 10 feet x 3 Blue foam NBOS w/ head turns, and trunk rotation left and right x 2 mins Blue foam  Modified tandem w/ head turns, and trunk rotation left and right x 2 mins Blue foam  Reaching and sorting balls x 2 mins step ups from floor to 6 inch stool x 20 bilateral  Stepping over bolster left and right and fwd/bwd, needs UE support with LOB backwards Tm walking . 4 m/ hour with cues to take longer steps and then was able to increase to 1. 0  m/hour x 5 mins, cues for long steps and standing posture correction                       PT Education - 07/03/17 [id number]  Education provided  Yes    Education Details  exercise technique    Person(s) Educated  Patient    Methods  Explanation;Demonstration;Verbal cues    Comprehension  Verbalized understanding;Returned demonstration       PT Short Term Goals - 06/26/17 1029      PT SHORT TERM GOAL #1   Title  Patient will be independent in home exercise program to improve strength/mobility for better functional independence with ADLs.    Time  4    Period  Weeks    Status  New    Target Date  07/24/17      PT SHORT TERM GOAL #2   Title  Patient (> 68 years old) will complete five times sit to stand test in < 15 seconds indicating an increased LE strength and improved balance.    Time  4    Period  Weeks    Target Date  07/24/17        PT Long Term Goals - 06/26/17 1030      PT LONG TERM GOAL #1   Title  Patient will increase six minute walk test distance to >1000 for progression to community ambulator and improve gait ability    Time  8    Period  Weeks    Status  New    Target Date  08/21/17      PT LONG TERM GOAL #2   Title  Patient will increase BLE gross strength to 4+/5 as to  improve functional strength for independent gait, increased standing tolerance and increased ADL ability.    Time  8    Period  Weeks    Status  New    Target Date  08/21/17      PT LONG TERM GOAL #3   Title  Patient will ascend/descend 4 stairs without rail assist independently without loss of balance to improve ability to get in/out of home.     Time  8    Period  Weeks    Status  New    Target Date  08/21/17      PT LONG TERM GOAL #4   Title  Patient will reduce timed up and go to <11 seconds to reduce fall risk and demonstrate improved transfer/gait ability.    Time  8    Period  Weeks    Status  New    Target Date  08/21/17            Plan - 07/03/17 0809     Clinical Impression Statement  Patient seems more comfortablewalking at a faster pace on the treadmill but continues to shows inadequate foot clearance and scuffs feet.  Patient tolerated new therex well with minimal cueing for good technique..  Patient still relies on vision for stability but improvement noted with balance therex today.  Patient will continue to benefit from skilled physical therapy to improve dynamic balance and increase endurance to reduce risk of falls    Rehab Potential  Good    PT Frequency  2x / week    PT Duration  8 weeks    PT Treatment/Interventions  Gait training;Therapeutic exercise;Therapeutic activities;Stair training;Balance training;Neuromuscular re-education;Patient/family education;Manual techniques;Aquatic Therapy;Moist Heat;Electrical Stimulation    PT Next Visit Plan  balance and therapeutic exercise for hip weakness    Consulted and Agree with Plan of Care  Patient;Family member/caregiver       Patient will benefit from skilled therapeutic intervention in order to improve the following deficits and impairments:  Abnormal gait, Decreased balance, Decreased endurance, Decreased mobility, Difficulty walking, Decreased knowledge of precautions, Decreased activity tolerance, Decreased  coordination, Decreased safety awareness, Decreased strength, Impaired flexibility, Postural dysfunction  Visit Diagnosis: Muscle weakness (generalized)  Other lack of coordination  Low vision, both eyes  Unsteadiness on feet  Difficulty in walking, not elsewhere classified     Problem List There are no active problems to display for this patient.   [address], [place] DPT 07/03/2017, 8:10 AM  [place] [facility] MAIN [facility] SERVICES [address] [place], [place], [address] Phone: [phone]   Fax:  [phone]  Name: FATEMA RABE MRN: [id number] Date of Birth: 1950-12-07

## 2017-07-03 NOTE — Therapy (Signed)
[place] [facility] [address] [place], [place], [address] Phone: [phone]   Fax:  [phone]  Occupational Therapy Treatment  Patient Details  Name: Haley Lucas MRN: [id number] Date of Birth: 12/07/1950 Referring Provider: Marshell GarfinkelO, SARAH J    Encounter Date: 07/03/2017  OT End of Session - 07/03/17 0855    Visit Number  3    Number of Visits  24    Date for OT Re-Evaluation  09/18/17    OT Start Time  0845    OT Stop Time  0930    OT Time Calculation (min)  45 min    Activity Tolerance  Patient tolerated treatment well    Behavior During Therapy  [facility] for tasks assessed/performed       Past Medical History:  Diagnosis Date  . High cholesterol   . Hypertension   . Thyroid disease     History reviewed. No pertinent surgical history.  There were no vitals filed for this visit.  Subjective Assessment - 07/03/17 0853    Subjective   Pt. reports she does not like her neck to be cold, so she is wearing a scarf.    Pertinent History  Pt is a 67 y.o. female who [facility]. Pt. received inpatient rehabilitation services followed by home health services. Pt. had surgery to repair a right drooping eyelid on February 19th., 2019. Pt. is ready for outpatient OT services.    Patient Stated Goals  OT services    Currently in Pain?  Yes    Pain Score  4      OT TREATMENT    Therapeutic Exercise:  Pt. Worked on BUE there. Ex. With red theraband for bilateral elbow flexion and extension. 1-2 sets 20 reps. 2# dumbbell ex. for elbow flexion and extension, forearm supination/pronation, wrist flexion/extension, and radial deviation. Pt. requires rest breaks and verbal cues for proper technique. Pt. Performed hand strengthening with green theraputty. Pt. required cues for proper technique. Pt. worked on gross grip loop, lateral pinch, 3pt. pinch, gross digit extension, digit extension table  spread, digit abduction loop, single digit extension loop, thumb opposition, and lumbical ex,  Pt. required verbal and tactile cues for proper technique. A visual handout was provided to pt.                               OT Education - 07/03/17 0855    Education provided  Yes    Education Details  UE ther. ex.    Person(s) Educated  Patient    Methods  Explanation;Demonstration;Verbal cues    Comprehension  Verbalized understanding;Returned demonstration          OT Long Term Goals - 06/26/17 1205      OT LONG TERM GOAL #1   Title  Pt. will increase UE strength by 2 mm grades to assist with ADLs, and IADL    Baseline  Eval: Limited BUE strength    Time  12    Period  Weeks    Status  New    Target Date  09/18/17      OT LONG TERM GOAL #2   Title  Pt. will improve right hand [facility] skills by 3 sec. to be able to manipulate ADL items.    Baseline  Eval: Right: 47 sec.    Time  12    Period  Weeks    Status  New    Target Date  09/18/17      OT LONG TERM GOAL #3   Title  Pt. will demonstrate visual compensatory strategies 100% of the time during ADLs, and IADLs.    Baseline  EVal: Pt. unable    Time  12    Period  Weeks    Status  New    Target Date  09/18/17      OT LONG TERM GOAL #4   Title  Pt. will demonstrate cognitive compensatory strategies 100% of the time during ADLs, and IADLs.    Baseline  Eval: Pt. is unable    Time  12    Period  Weeks    Status  New    Target Date  09/18/17      OT LONG TERM GOAL #5   Title  Pt. will complete IADL, home management tasks with Supervision.     Baseline  Eval: Dependent    Time  12    Period  Weeks    Status  New    Target Date  09/18/17            Plan - 07/03/17 0856    Clinical Impression Statement Pt. continues to have intermintent nighttime pain in her bilateral shoulders. Pt. is experiencing this paim at night time only. Pt. with no complaints of pain during the treatment session.  Pt. continues to work on improving overall UE strength for improved engagement in ADL, and IADL tasks.    Occupational Profile and client history currently impacting functional performance  Pt. 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    Occupational performance deficits (Please refer to evaluation for details):  ADL's;IADL's    Rehab Potential  Good    Current Impairments/barriers affecting progress:  Positive indicators: age, family support, motivation, Negative indicators: multiple comorbidities.    OT Frequency  2x / week    OT Duration  12 weeks    OT Treatment/Interventions  Self-care/ADL training;Neuromuscular education;Therapeutic activities;Cognitive remediation/compensation;Passive range of motion;DME and/or AE instruction;Patient/family education;Energy conservation;Therapeutic exercise    Clinical Decision Making  Multiple treatment options, significant modification of task necessary    Consulted and Agree with Plan of Care  Patient       Patient will benefit from skilled therapeutic intervention in order to improve the following deficits and impairments:  Pain, Impaired UE functional use, Decreased knowledge of precautions, Decreased cognition, Impaired tone, Decreased strength, Decreased endurance, Decreased activity tolerance, Decreased knowledge of use of DME, Decreased balance  Visit Diagnosis: Muscle weakness (generalized)    Problem List There are no active problems to display for this patient.   [name], MS, OTR/L 07/03/2017, 9:42 AM  [place] [facility] MAIN [facility] SERVICES [address] [place], [place], [address] Phone: [phone]   Fax:  [phone]  Name: Haley Lucas MRN: [id number] Date of Birth: 1951-04-13

## 2017-07-09 ENCOUNTER — Encounter: Payer: Self-pay | Admitting: Occupational Therapy

## 2017-07-09 ENCOUNTER — Ambulatory Visit: Payer: Medicare Other | Admitting: Occupational Therapy

## 2017-07-09 ENCOUNTER — Ambulatory Visit: Payer: Medicare Other

## 2017-07-09 VITALS — BP 128/76 | HR 67

## 2017-07-09 DIAGNOSIS — M6281 Muscle weakness (generalized): Secondary | ICD-10-CM

## 2017-07-09 DIAGNOSIS — R2681 Unsteadiness on feet: Secondary | ICD-10-CM

## 2017-07-09 NOTE — Therapy (Signed)
[place] [facility] MAIN [facility] SERVICES [address] [place], [place], [address] Phone: [phone]   Fax:  [phone]  Occupational Therapy Treatment  Patient Details  Name: Haley Lucas MRN: [id number] Date of Birth: 05/18/1950 Referring Provider: Marshell GarfinkelO, SARAH J    Encounter Date: 07/09/2017  OT End of Session - 07/09/17 1400    Visit Number  4    Number of Visits  24    Date for OT Re-Evaluation  09/18/17    OT Start Time  1350    OT Stop Time  1430    OT Time Calculation (min)  40 min    Activity Tolerance  Patient tolerated treatment well    Behavior During Therapy  [facility] for tasks assessed/performed       Past Medical History:  Diagnosis Date  . High cholesterol   . Hypertension   . Thyroid disease     History reviewed. No pertinent surgical history.  There were no vitals filed for this visit.  Subjective Assessment - 07/09/17 1355    Subjective   Pt. [facility].    Pertinent History  Pt is a 67 y.o. female who [facility]. Pt. received inpatient rehabilitation services followed by home health services. Pt. had surgery to repair a right drooping eyelid on February 19th., 2019. Pt. is ready for outpatient OT services.    Patient Stated Goals  OT services    Currently in Pain?  Yes    Pain Score  0-No pain    Effect of Pain on Daily Activities  Pt. reports bilateral shoulder pain when sleeping at night, and left shoulder pain when reaching to her back.    Multiple Pain Sites  No        OT TREATMENT    Therapeutic Exercise:  Pt. Worked on BUE there. Ex. With red theraband for bilateral elbow flexion and extension. 1-2 sets 20 reps. 3# dumbbell ex. for elbow flexion and extension, forearm supination/pronation, 2# for + wrist flexion/extension, and radial deviation. Pt. requires rest breaks and verbal cues for proper technique.Pt. Worked on  pinch strengthening in the biltaeral hands for lateral, and 3pt. pinch using yellow, red, green, and blue resistive clips. Pt. worked on placing the clips at various vertical and horizontal angles. Tactile and verbal cues were required for eliciting the desired movement.                        OT Education - 07/09/17 1359    Education provided  Yes    Education Details  UE ther. ex.     Person(s) Educated  Patient    Methods  Explanation;Demonstration;Verbal cues    Comprehension  Verbalized understanding;Returned demonstration          OT Long Term Goals - 06/26/17 1205      OT LONG TERM GOAL #1   Title  Pt. will increase UE strength by 2 mm grades to assist with ADLs, and IADL    Baseline  Eval: Limited BUE strength    Time  12    Period  Weeks    Status  New    Target Date  09/18/17      OT LONG TERM GOAL #2   Title  Pt. will improve right hand [facility] skills by 3 sec. to be able to manipulate ADL items.    Baseline  Eval: Right: 47 sec.    Time  12    Period  Weeks    Status  New    Target Date  09/18/17      OT LONG TERM GOAL #3   Title  Pt. will demonstrate visual compensatory strategies 100% of the time during ADLs, and IADLs.    Baseline  EVal: Pt. unable    Time  12    Period  Weeks    Status  New    Target Date  09/18/17      OT LONG TERM GOAL #4   Title  Pt. will demonstrate cognitive compensatory strategies 100% of the time during ADLs, and IADLs.    Baseline  Eval: Pt. is unable    Time  12    Period  Weeks    Status  New    Target Date  09/18/17      OT LONG TERM GOAL #5   Title  Pt. will complete IADL, home management tasks with Supervision.     Baseline  Eval: Dependent    Time  12    Period  Weeks    Status  New    Target Date  09/18/17            Plan - 07/09/17 1401    Clinical Impression Statement  Pt. continues to have pain in her bilateral shoulders at night when sleeping. Pt. has has new shoulder pain during the  day when bringing her LUE behind to the middle of her back. Pt. [facility]. Pt. continues to work on improving overal  ADL, and IADL functioning.    Occupational Profile and client history currently impacting functional performance  Pt. 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    Occupational performance deficits (Please refer to evaluation for details):  ADL's;IADL's    Rehab Potential  Good    Current Impairments/barriers affecting progress:  Positive indicators: age, family support, motivation, Negative indicators: multiple comorbidities.    OT Frequency  2x / week    OT Duration  12 weeks    OT Treatment/Interventions  Self-care/ADL training;Neuromuscular education;Therapeutic activities;Cognitive remediation/compensation;Passive range of motion;DME and/or AE instruction;Patient/family education;Energy conservation;Therapeutic exercise    Clinical Decision Making  Multiple treatment options, significant modification of task necessary    Consulted and Agree with Plan of Care  Patient       Patient will benefit from skilled therapeutic intervention in order to improve the following deficits and impairments:  Pain, Impaired UE functional use, Decreased knowledge of precautions, Decreased cognition, Impaired tone, Decreased strength, Decreased endurance, Decreased activity tolerance, Decreased knowledge of use of DME, Decreased balance  Visit Diagnosis: Muscle weakness (generalized)    Problem List There are no active problems to display for this patient.   [name], MS, OTR/L 07/09/2017, 2:16 PM  [place] [facility] MAIN [facility] SERVICES [address] [place], [place], [address] Phone: [phone]   Fax:  [phone]  Name: Haley Lucas MRN: [id number] Date of Birth: 07/09/1950

## 2017-07-09 NOTE — Therapy (Signed)
[facility] MAIN [facility] SERVICES [address] [place], [place], [address] Phone: [phone]   Fax:  [phone]  Physical Therapy Treatment  Patient Details  Name: Haley Lucas MRN: [id number] Date of Birth: 11-24-1950 Referring Provider: Marshell Garfinkel    Encounter Date: 07/09/2017  PT End of Session - 07/09/17 1440    Visit Number  4    Number of Visits  17    Date for PT Re-Evaluation  08/21/17    PT Start Time  1433    PT Stop Time  1515    PT Time Calculation (min)  42 min    Equipment Utilized During Treatment  Gait belt    Activity Tolerance  Patient tolerated treatment well    Behavior During Therapy  [facility] for tasks assessed/performed       Past Medical History:  Diagnosis Date  . High cholesterol   . Hypertension   . Thyroid disease     History reviewed. No pertinent surgical history.  Vitals:   07/09/17 1435  BP: 128/76  Pulse: 67  SpO2: 97%    Subjective Assessment - 07/09/17 1439    Subjective  Pt states she is doing well today. She denies pain and has no specific questions or concerns currently. Pt denies dizziness.     Pertinent History  She had a hughes flap to her eye right eye feb 18th at [facility]. She was in the hospital for a month from the first surgey and was discharged Dec 11th. She had PT and OT for  2 weeks and then HHPT PT, OT and ST.  Tha ended christmas. She was not using a Assistive devie prior to surgery. She has weakness in her arms and she is not able to walk her normal distances.     Limitations  Standing    How long can you stand comfortably?  10 mins    Patient Stated Goals  to be able to walk and stand for longer periods of time, and improve balance.     Currently in Pain?  No/denies                No data recorded       TREATMENT  Neuromuscular Re-education  NuStep L2 x 5 minutes for warm-up during history (2 minutes unbilled); NBOS static balance as well as with horizontal  and vertical head turns, multiple bouts of 30s each; Semitandem stance static balance as well as with horizontal and vertical head turns, multiple bouts of 30s each alternating forward LE; Ambulation in hallway with horizontal and vertical head turns 75' x multiple bouts; Quick 180 degree turns in hallway first L and then R with therapist calling out direction and timing for turns x multiple bouts of each; Sit to stand with L and R head turns alternating sides x 15; VOR x 1 horizontal 60s x 3, extensive cues and passive assist initially. Pt able to perform actively during the final set;             PT Education - 07/09/17 1440    Education provided  Yes    Education Details  exercise form/technique    Person(s) Educated  Patient    Methods  Explanation    Comprehension  Verbalized understanding       PT Short Term Goals - 06/26/17 1029      PT SHORT TERM GOAL #1   Title  Patient will be independent in home exercise program  to improve strength/mobility for better functional independence with ADLs.    Time  4    Period  Weeks    Status  New    Target Date  07/24/17      PT SHORT TERM GOAL #2   Title  Patient (> 50 years old) will complete five times sit to stand test in < 15 seconds indicating an increased LE strength and improved balance.    Time  4    Period  Weeks    Target Date  07/24/17        PT Long Term Goals - 06/26/17 1030      PT LONG TERM GOAL #1   Title  Patient will increase six minute walk test distance to >1000 for progression to community ambulator and improve gait ability    Time  8    Period  Weeks    Status  New    Target Date  08/21/17      PT LONG TERM GOAL #2   Title  Patient will increase BLE gross strength to 4+/5 as to improve functional strength for independent gait, increased standing tolerance and increased ADL ability.    Time  8    Period  Weeks    Status  New    Target Date  08/21/17      PT LONG TERM GOAL #3   Title  Patient  will ascend/descend 4 stairs without rail assist independently without loss of balance to improve ability to get in/out of home.     Time  8    Period  Weeks    Status  New    Target Date  08/21/17      PT LONG TERM GOAL #4   Title  Patient will reduce timed up and go to <11 seconds to reduce fall risk and demonstrate improved transfer/gait ability.    Time  8    Period  Weeks    Status  New    Target Date  08/21/17            Plan - 07/09/17 1440    Clinical Impression Statement  Pt is very guarded with head turns and ambulates with en bloc movement. She demonstrates ability to improve speed of head turns with repetition during session. Pt with notable imbalance with head turns especially during ambulaiton with head turns and in semitandem stance. She needs to continue working on dynamic head turning activities. She is able to perform VORx1 horizontal exercises but with extensive verbal and tactile cues. Pt encouraged to follow-up as scheduled.     Rehab Potential  Good    PT Frequency  2x / week    PT Duration  8 weeks    PT Treatment/Interventions  Gait training;Therapeutic exercise;Therapeutic activities;Stair training;Balance training;Neuromuscular re-education;Patient/family education;Manual techniques;Aquatic Therapy;Moist Heat;Electrical Stimulation    PT Next Visit Plan  balance and therapeutic exercise for hip weakness    Consulted and Agree with Plan of Care  Patient;Family member/caregiver       Patient will benefit from skilled therapeutic intervention in order to improve the following deficits and impairments:  Abnormal gait, Decreased balance, Decreased endurance, Decreased mobility, Difficulty walking, Decreased knowledge of precautions, Decreased activity tolerance, Decreased coordination, Decreased safety awareness, Decreased strength, Impaired flexibility, Postural dysfunction  Visit Diagnosis: Muscle weakness (generalized)  Unsteadiness on feet     Problem  List There are no active problems to display for this patient.  Lynnea Maizes PT, DPT   Juliah Scadden  07/09/2017, 4:26 PM  [facility]IN [facility] [address] [place], [place], [address] Phone: [phone]   Fax:  [phone]  Name: Haley LemonsHyun S Lucas MRN: [id number] Date of Birth: 12/16/1950

## 2017-07-16 ENCOUNTER — Encounter: Payer: Self-pay | Admitting: Physical Therapy

## 2017-07-16 ENCOUNTER — Ambulatory Visit: Payer: Medicare Other | Attending: Family Medicine | Admitting: Physical Therapy

## 2017-07-16 DIAGNOSIS — R2681 Unsteadiness on feet: Secondary | ICD-10-CM | POA: Insufficient documentation

## 2017-07-16 DIAGNOSIS — R278 Other lack of coordination: Secondary | ICD-10-CM

## 2017-07-16 DIAGNOSIS — R262 Difficulty in walking, not elsewhere classified: Secondary | ICD-10-CM | POA: Insufficient documentation

## 2017-07-16 DIAGNOSIS — H543 Unqualified visual loss, both eyes: Secondary | ICD-10-CM | POA: Insufficient documentation

## 2017-07-16 DIAGNOSIS — M6281 Muscle weakness (generalized): Secondary | ICD-10-CM | POA: Insufficient documentation

## 2017-07-16 NOTE — Therapy (Signed)
[facility] MAIN [facility] SERVICES [address] [place], [place], [address] Phone: [phone]   Fax:  [phone]  Physical Therapy Treatment  Patient Details  Name: Haley Lucas MRN: [id number] Date of Birth: January 06, 1951 Referring Provider: Marshell Garfinkel    Encounter Date: 07/16/2017  PT End of Session - 07/16/17 1538    Visit Number  5    Number of Visits  17    Date for PT Re-Evaluation  08/21/17    PT Start Time  1435    PT Stop Time  1520    PT Time Calculation (min)  45 min    Equipment Utilized During Treatment  Gait belt    Activity Tolerance  Patient tolerated treatment well    Behavior During Therapy  [facility] for tasks assessed/performed       Past Medical History:  Diagnosis Date  . High cholesterol   . Hypertension   . Thyroid disease     History reviewed. No pertinent surgical history.  There were no vitals filed for this visit.  Subjective Assessment - 07/16/17 1449    Subjective  Pt states she is doing well today. She reports pain continues in her left  shoulder that is constant and  right shoulder is now getting to be much less pain.  Pt denies dizziness.     Pertinent History  She had a hughes flap to her eye right eye feb 18th at [facility]. She was in the hospital for a month from the first surgey and was discharged Dec 11th. She had PT and OT for  2 weeks and then HHPT PT, OT and ST.  Tha ended christmas. She was not using a Assistive devie prior to surgery. She has weakness in her arms and she is not able to walk her normal distances.     Limitations  Standing    How long can you stand comfortably?  10 mins    Patient Stated Goals  to be able to walk and stand for longer periods of time, and improve balance.     Currently in Pain?  Yes    Pain Score  7     Pain Location  Shoulder    Pain Orientation  Left    Pain Descriptors / Indicators  Aching    Pain Onset  More than a month ago    Multiple Pain Sites  No        NEUROMUSCULAR RE-EDUCATION Airex NBOS eyes open/closed x 30 seconds each; Airex NBOS eyes open horizontal and vertical head turns x 30 seconds; Airex cone taps alternating LE x 60 seconds; Tandem gait in // bars x 4 laps Side stepping on blue  foam balance beam x 5 lengths of the parallel bars with posture correction cues Matrix with 2 plates fwd/bwd, side to side stepping/ diagonal stepping, cues to take longer steps Standing on foam NBOS  reaching  For cones and crossing midline and stacking, cues for weight shift and turning her head  Alternating toe taps up to 8" step from airex with cues for soft toe tapping for greater control. Greater instability during L SLS. Intermittent UE support   Cues for proper technique of exercises, slow eccentric contractions to target specific muscles and facility increased muscle building. Therapeutic rest breaks for energy conservation    Gait training on TM with various speeds and with and without UE support x 10 mins.  PT Education - 07/16/17 1453    Education provided  Yes    Education Details  heating pad to left shoudler     Person(s) Educated  Patient    Methods  Explanation;Demonstration;Tactile cues;Verbal cues    Comprehension  Verbalized understanding;Returned demonstration       PT Short Term Goals - 06/26/17 1029      PT SHORT TERM GOAL #1   Title  Patient will be independent in home exercise program to improve strength/mobility for better functional independence with ADLs.    Time  4    Period  Weeks    Status  New    Target Date  07/24/17      PT SHORT TERM GOAL #2   Title  Patient (> 66 years old) will complete five times sit to stand test in < 15 seconds indicating an increased LE strength and improved balance.    Time  4    Period  Weeks    Target Date  07/24/17        PT Long Term Goals - 06/26/17 1030      PT LONG TERM GOAL #1   Title  Patient will increase six minute walk  test distance to >1000 for progression to community ambulator and improve gait ability    Time  8    Period  Weeks    Status  New    Target Date  08/21/17      PT LONG TERM GOAL #2   Title  Patient will increase BLE gross strength to 4+/5 as to improve functional strength for independent gait, increased standing tolerance and increased ADL ability.    Time  8    Period  Weeks    Status  New    Target Date  08/21/17      PT LONG TERM GOAL #3   Title  Patient will ascend/descend 4 stairs without rail assist independently without loss of balance to improve ability to get in/out of home.     Time  8    Period  Weeks    Status  New    Target Date  08/21/17      PT LONG TERM GOAL #4   Title  Patient will reduce timed up and go to <11 seconds to reduce fall risk and demonstrate improved transfer/gait ability.    Time  8    Period  Weeks    Status  New    Target Date  08/21/17            Plan - 07/16/17 1540    Clinical Impression Statement  Dynamic and static balance interventions continued today, with a focus on unilateral LE stability.  Pt tolerated all exercises well, but required increased rest breaks secondary to increased LE fatigue , intermittent pain and weakness today.  LE strength exercises were progressed today with focus on unilateral strength and stability to focus on increasing LE strength and stability with daily tasks.  Pt was encouraged to perform HEP during the week in order to continue progressing balance and strength interventions.  Pt would continue to benefit from skilled therapy services in order to further address LE strength deficits and balance deficits in order to decrease fall risk and improve mobility.    Rehab Potential  Good    PT Frequency  2x / week    PT Duration  8 weeks    PT Treatment/Interventions  Gait training;Therapeutic exercise;Therapeutic activities;Stair training;Balance training;Neuromuscular re-education;Patient/family education;Manual  techniques;Aquatic  Therapy;Moist Heat;Electrical Stimulation    PT Next Visit Plan  balance and therapeutic exercise for hip weakness    Consulted and Agree with Plan of Care  Patient;Family member/caregiver       Patient will benefit from skilled therapeutic intervention in order to improve the following deficits and impairments:  Abnormal gait, Decreased balance, Decreased endurance, Decreased mobility, Difficulty walking, Decreased knowledge of precautions, Decreased activity tolerance, Decreased coordination, Decreased safety awareness, Decreased strength, Impaired flexibility, Postural dysfunction  Visit Diagnosis: Muscle weakness (generalized)  Unsteadiness on feet  Other lack of coordination  Low vision, both eyes  Difficulty in walking, not elsewhere classified     Problem List There are no active problems to display for this patient.   [address] , [place] DPT 07/16/2017, 3:46 PM  [facility] MAIN [facility] SERVICES [address] [place], [place], [address] Phone: [phone]   Fax:  [phone]  Name: Haley Lucas MRN: [id number] Date of Birth: 1950-07-06

## 2017-07-18 ENCOUNTER — Ambulatory Visit: Payer: Medicare Other | Admitting: Physical Therapy

## 2017-07-22 ENCOUNTER — Other Ambulatory Visit: Payer: Self-pay

## 2017-07-22 ENCOUNTER — Ambulatory Visit: Payer: Medicare Other | Admitting: Physical Therapy

## 2017-07-22 ENCOUNTER — Encounter: Payer: Self-pay | Admitting: Occupational Therapy

## 2017-07-22 ENCOUNTER — Encounter: Payer: Self-pay | Admitting: Physical Therapy

## 2017-07-22 ENCOUNTER — Ambulatory Visit: Payer: Medicare Other | Admitting: Occupational Therapy

## 2017-07-22 DIAGNOSIS — R278 Other lack of coordination: Secondary | ICD-10-CM

## 2017-07-22 DIAGNOSIS — M6281 Muscle weakness (generalized): Secondary | ICD-10-CM | POA: Diagnosis not present

## 2017-07-22 DIAGNOSIS — R262 Difficulty in walking, not elsewhere classified: Secondary | ICD-10-CM

## 2017-07-22 DIAGNOSIS — H543 Unqualified visual loss, both eyes: Secondary | ICD-10-CM

## 2017-07-22 DIAGNOSIS — R2681 Unsteadiness on feet: Secondary | ICD-10-CM

## 2017-07-22 NOTE — Therapy (Signed)
[facility] [address] [place], [place], [address] Phone: [phone]   Fax:  [phone]  Occupational Therapy Treatment  Patient Details  Name: Haley Lucas MRN: [id number] Date of Birth: 06/22/1950 Referring Provider: Marshell GarfinkelO, SARAH J    Encounter Date: 07/22/2017  OT End of Session - 07/22/17 1650    Visit Number  5    Number of Visits  24    Date for OT Re-Evaluation  09/18/17    OT Start Time  1100    OT Stop Time  1145    OT Time Calculation (min)  45 min    Activity Tolerance  Patient tolerated treatment well    Behavior During Therapy  [facility] for tasks assessed/performed       Past Medical History:  Diagnosis Date  . High cholesterol   . Hypertension   . Thyroid disease     No past surgical history on file.  There were no vitals filed for this visit.  Subjective Assessment - 07/22/17 1635    Subjective   Pt was sleeping in lobby upon OT's arrival and stated she was sleepy but fine.    Pertinent History  Pt is a 67 y.o. female who [facility]. Pt. received inpatient rehabilitation services followed by home health services. Pt. had surgery to repair a right drooping eyelid on February 19th., 2019. Pt. is ready for outpatient OT services.    Patient Stated Goals  OT services    Currently in Pain?  Yes    Pain Score  8     Pain Location  Shoulder    Pain Orientation  Left    Pain Descriptors / Indicators  Aching    Pain Type  Chronic pain    Pain Onset  More than a month ago    Pain Frequency  Constant    Aggravating Factors   rolling in bed from one side to another    Pain Relieving Factors  keeping arms down and not reaching up    Effect of Pain on Daily Activities  unable to participate in as much as she would like to during the day.    Multiple Pain Sites  No                   OT Treatments/Exercises (OP) - 07/22/17 1638      Neurological  Re-education Exercises   Shoulder Flexion  -- Pt was able to complete finger crawl on wall to stop number     Other Exercises 1  Pt stated that she has green theraband at home and feels that this is making her LUE more sore.  Rec that she stop doing the exercises at home to give her shoulder a time to rest and OT will re-assess her shoulder at next session this week and see if red theraband would be a better resistance for her.  Participated in dumbell exercises today with 3# for elbow flex, ext, supination and pronation and 2# dumbell for L wrist flexion, extension and radial deviation.    Other Exercises 2  Manual therapy for 10 minutes to release trugger points in L lateral shoulder and Latissimus dorsi that was causing radiating pain into L shoulder during tabletop reaching tasks.  Completed finger crawling activity on wall to level 13 and then 16 for LUE and to 20 both tries for RUE.      Manual Therapy  Manual Therapy  Myofascial release    Manual therapy comments  -- Trigger point and myofascial release to lateral scapular mms             OT Education - 07/22/17 1650    Education provided  Yes    Education Details  HEP    Person(s) Educated  Patient    Methods  Explanation    Comprehension  Verbalized understanding;Returned demonstration          OT Long Term Goals - 06/26/17 1205      OT LONG TERM GOAL #1   Title  Pt. will increase UE strength by 2 mm grades to assist with ADLs, and IADL    Baseline  Eval: Limited BUE strength    Time  12    Period  Weeks    Status  New    Target Date  09/18/17      OT LONG TERM GOAL #2   Title  Pt. will improve right hand [facility] skills by 3 sec. to be able to manipulate ADL items.    Baseline  Eval: Right: 47 sec.    Time  12    Period  Weeks    Status  New    Target Date  09/18/17      OT LONG TERM GOAL #3   Title  Pt. will demonstrate visual compensatory strategies 100% of the time during ADLs, and IADLs.    Baseline  EVal:  Pt. unable    Time  12    Period  Weeks    Status  New    Target Date  09/18/17      OT LONG TERM GOAL #4   Title  Pt. will demonstrate cognitive compensatory strategies 100% of the time during ADLs, and IADLs.    Baseline  Eval: Pt. is unable    Time  12    Period  Weeks    Status  New    Target Date  09/18/17      OT LONG TERM GOAL #5   Title  Pt. will complete IADL, home management tasks with Supervision.     Baseline  Eval: Dependent    Time  12    Period  Weeks    Status  New    Target Date  09/18/17            Plan - 07/22/17 1651    Clinical Impression Statement  Pt continues to have pain in LUE more than RUE especially when sleeping at night and discussed options for supporting her arm that is pointing to ceiling on a pillow.  Also discussed stopping her green theraband exercises since the resisitance may be too high and have OT re-assess resistance at next session.  Pt continues to be movtivated to improve ADL and IADL functioning with less pain.      Occupational Profile and client history currently impacting functional performance  Pt. 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    Occupational performance deficits (Please refer to evaluation for details):  ADL's;IADL's    Rehab Potential  Good    Current Impairments/barriers affecting progress:  Positive indicators: age, family support, motivation, Negative indicators: multiple comorbidities.    OT Frequency  2x / week    OT Duration  12 weeks    OT Treatment/Interventions  Self-care/ADL training;Neuromuscular education;Therapeutic activities;Cognitive remediation/compensation;Passive range of motion;DME and/or AE instruction;Patient/family education;Energy conservation;Therapeutic exercise;Manual Therapy    Clinical Decision Making  Multiple  treatment options, significant modification of task necessary    Consulted and Agree with Plan of Care  Patient       Patient will benefit from  skilled therapeutic intervention in order to improve the following deficits and impairments:  Pain, Impaired UE functional use, Decreased knowledge of precautions, Decreased cognition, Impaired tone, Decreased strength, Decreased endurance, Decreased activity tolerance, Decreased knowledge of use of DME, Decreased balance  Visit Diagnosis: Muscle weakness (generalized)  Other lack of coordination    Problem List There are no active problems to display for this patient.   Susanne Borders, OTR/L ascom [phone] 07/22/17, 4:56 PM  [facility] MAIN [facility] SERVICES [address] [place], [place], [address] Phone: [phone]   Fax:  [phone]  Name: Haley Lucas MRN: [id number] Date of Birth: Jul 17, 1950

## 2017-07-22 NOTE — Therapy (Signed)
[facility] MAIN [facility] SERVICES [address] [place], [place], [address] Phone: [phone]   Fax:  [phone]  Physical Therapy Treatment  Patient Details  Name: Haley Lucas MRN: [id number] Date of Birth: 01-16-1951 Referring Provider: Marshell Garfinkel    Encounter Date: 07/22/2017  PT End of Session - 07/22/17 1201    Visit Number  6    Number of Visits  17    Date for PT Re-Evaluation  08/21/17    PT Start Time  1145    PT Stop Time  1230    PT Time Calculation (min)  45 min    Equipment Utilized During Treatment  Gait belt    Activity Tolerance  Patient tolerated treatment well    Behavior During Therapy  [facility] for tasks assessed/performed       Past Medical History:  Diagnosis Date  . High cholesterol   . Hypertension   . Thyroid disease     History reviewed. No pertinent surgical history.  There were no vitals filed for this visit.  Subjective Assessment - 07/22/17 1159    Subjective  Pt states she is doing well today. She reports pain continues in her left  shoulder that is constant and  right shoulder is now getting to be much less pain.  Pt denies dizziness.     Currently in Pain?  Yes    Pain Score  8     Pain Location  Shoulder    Pain Orientation  Left    Pain Descriptors / Indicators  Aching    Pain Type  Chronic pain    Pain Onset  More than a month ago    Pain Frequency  Constant    Aggravating Factors   rolling on her shoulder in bed    Pain Relieving Factors  not raising them above her shoulder height    Effect of Pain on Daily Activities  unable to do as many activities    Multiple Pain Sites  No       Treatment: Gait training with TM at elevation of 2 and speed 1. 4 miles/ hour  Air ex foam side stepping x 5 10 feet, fwd stepping x 5 10 feet, cues for posture and head position  2 x 4 fwd stepping 5 x 10 feet , cues for better posture  Feet on firm surface BUE wand  fwd flexion x20 reps x 2 and min A for  safety and cues to increase weight shift for better stance control  Tandem stance on 1/2 bolster 10 sec hold unsupported x2 reps each foot in front with min A for safety and cues to improve  weight shift and trunk control for better balance in dynamic tasks   Rockerboard: Forward/backward, side/side controlled rock x 2 min each with rail assist for safety and cues to improve weight shift during gait. Side to side controlled rock with min assist for safety and cues to keep posture upright   CGA and Min to mod verbal cues used throughout with increased in postural sway and LOB most seen with narrow base of support and while on uneven surfaces. Continues to have balance deficits typical with diagnosis. Patient performs intermediate level exercises without pain behaviors and needs verbal cuing for postural alignment and head positioning                      PT Education - 07/22/17 1200    Education provided  Yes    Education Details  HEP    Person(s) Educated  Patient    Methods  Explanation    Comprehension  Returned demonstration;Verbalized understanding       PT Short Term Goals - 06/26/17 1029      PT SHORT TERM GOAL #1   Title  Patient will be independent in home exercise program to improve strength/mobility for better functional independence with ADLs.    Time  4    Period  Weeks    Status  New    Target Date  07/24/17      PT SHORT TERM GOAL #2   Title  Patient (> 66 years old) will complete five times sit to stand test in < 15 seconds indicating an increased LE strength and improved balance.    Time  4    Period  Weeks    Target Date  07/24/17        PT Long Term Goals - 06/26/17 1030      PT LONG TERM GOAL #1   Title  Patient will increase six minute walk test distance to >1000 for progression to community ambulator and improve gait ability    Time  8    Period  Weeks    Status  New    Target Date  08/21/17      PT LONG TERM GOAL #2   Title   Patient will increase BLE gross strength to 4+/5 as to improve functional strength for independent gait, increased standing tolerance and increased ADL ability.    Time  8    Period  Weeks    Status  New    Target Date  08/21/17      PT LONG TERM GOAL #3   Title  Patient will ascend/descend 4 stairs without rail assist independently without loss of balance to improve ability to get in/out of home.     Time  8    Period  Weeks    Status  New    Target Date  08/21/17      PT LONG TERM GOAL #4   Title  Patient will reduce timed up and go to <11 seconds to reduce fall risk and demonstrate improved transfer/gait ability.    Time  8    Period  Weeks    Status  New    Target Date  08/21/17            Plan - 07/22/17 1202    Clinical Impression Statement  Patient demonstrates LOB with standing balance exercises indicating decreased balancing strategies. Patient did not require UE support to perform matrix standing balance and challenged surfaces with reaching tasks.. Patient will benefit from further skilled therapy to return to prior level of function. .  Pt was encouraged to perform HEP during the week in order to continue progressing balance and strength interventions.  Pt would continue to benefit from skilled therapy services in order to further address LE strength deficits and balance deficits in order to decrease fall risk and improve mobility.    Rehab Potential  Good    PT Frequency  2x / week    PT Duration  8 weeks    PT Treatment/Interventions  Gait training;Therapeutic exercise;Therapeutic activities;Stair training;Balance training;Neuromuscular re-education;Patient/family education;Manual techniques;Aquatic Therapy;Moist Heat;Electrical Stimulation    PT Next Visit Plan  balance and therapeutic exercise for hip weakness    Consulted and Agree with Plan of Care  Patient;Family member/caregiver  Patient will benefit from skilled therapeutic intervention in order to  improve the following deficits and impairments:  Abnormal gait, Decreased balance, Decreased endurance, Decreased mobility, Difficulty walking, Decreased knowledge of precautions, Decreased activity tolerance, Decreased coordination, Decreased safety awareness, Decreased strength, Impaired flexibility, Postural dysfunction  Visit Diagnosis: Muscle weakness (generalized)  Unsteadiness on feet  Other lack of coordination  Low vision, both eyes  Difficulty in walking, not elsewhere classified     Problem List There are no active problems to display for this patient.   [address] S PT , DPT 07/22/2017, 12:03 PM  [facility] MAIN [facility] SERVICES [address] [place], [place], [address] Phone: [phone]   Fax:  [phone]  Name: Haley Lucas MRN: [id number] Date of Birth: May 11, 1950

## 2017-07-25 ENCOUNTER — Encounter: Payer: Self-pay | Admitting: Occupational Therapy

## 2017-07-25 ENCOUNTER — Ambulatory Visit: Payer: Medicare Other | Admitting: Occupational Therapy

## 2017-07-25 ENCOUNTER — Encounter: Payer: Self-pay | Admitting: Physical Therapy

## 2017-07-25 ENCOUNTER — Ambulatory Visit: Payer: Medicare Other | Admitting: Physical Therapy

## 2017-07-25 DIAGNOSIS — M6281 Muscle weakness (generalized): Secondary | ICD-10-CM | POA: Diagnosis not present

## 2017-07-25 DIAGNOSIS — H543 Unqualified visual loss, both eyes: Secondary | ICD-10-CM

## 2017-07-25 DIAGNOSIS — R2681 Unsteadiness on feet: Secondary | ICD-10-CM

## 2017-07-25 DIAGNOSIS — R262 Difficulty in walking, not elsewhere classified: Secondary | ICD-10-CM

## 2017-07-25 DIAGNOSIS — R278 Other lack of coordination: Secondary | ICD-10-CM

## 2017-07-25 NOTE — Therapy (Signed)
[facility] [address] [place], [place], [address] Phone: [phone]   Fax:  [phone]  Occupational Therapy Treatment  Patient Details  Name: Haley LemonsHyun S Lucas MRN: [id number] Date of Birth: 10/18/1950 Referring Provider: Marshell GarfinkelO, SARAH J    Encounter Date: 07/25/2017  OT End of Session - 07/25/17 1714    Visit Number  6    Number of Visits  24    Date for OT Re-Evaluation  09/18/17    Authorization Type  visit 6/10 for progress report period starting 06/26/2017    OT Start Time  1145    OT Stop Time  1230    OT Time Calculation (min)  45 min    Activity Tolerance  Patient tolerated treatment well    Behavior During Therapy  [facility] for tasks assessed/performed       Past Medical History:  Diagnosis Date  . High cholesterol   . Hypertension   . Thyroid disease     History reviewed. No pertinent surgical history.  There were no vitals filed for this visit.  Subjective Assessment - 07/25/17 1150    Subjective   Pt. present with husband.    Pertinent History  Pt is a 67 y.o. female who [facility]. Pt. received inpatient rehabilitation services followed by home health services. Pt. had surgery to repair a right drooping eyelid on February 19th., 2019. Pt. is ready for outpatient OT services.    Patient Stated Goals  OT services    Currently in Pain?  Yes    Pain Score  8     Pain Location  Shoulder    Pain Orientation  Left    Aggravating Factors   Pain when laying down.      OT TREATMENT    Therapeutic Exercise:  Pt. worked on AROM stretches in all joint ranges of the LUE. Pt. worked on progressive reaching with the LUE using the vertical shape tower to the 3rd rung. Pt. worked on using the ROM finger ladder. Pt. Worked on Bilateral elbow, and forearm strengthening with a 2# weight for elbow flexion, and extension. There. Ex was performed in preparation for using her UE  during ADL, and IADL tasks.                          OT Education - 07/25/17 1713    Education provided  Yes    Education Details  UE ther. ex    Person(s) Educated  Patient    Methods  Explanation;Demonstration;Tactile cues;Verbal cues    Comprehension  Verbalized understanding;Returned demonstration          OT Long Term Goals - 06/26/17 1205      OT LONG TERM GOAL #1   Title  Pt. will increase UE strength by 2 mm grades to assist with ADLs, and IADL    Baseline  Eval: Limited BUE strength    Time  12    Period  Weeks    Status  New    Target Date  09/18/17      OT LONG TERM GOAL #2   Title  Pt. will improve right hand [facility] skills by 3 sec. to be able to manipulate ADL items.    Baseline  Eval: Right: 47 sec.    Time  12    Period  Weeks    Status  New  Target Date  09/18/17      OT LONG TERM GOAL #3   Title  Pt. will demonstrate visual compensatory strategies 100% of the time during ADLs, and IADLs.    Baseline  EVal: Pt. unable    Time  12    Period  Weeks    Status  New    Target Date  09/18/17      OT LONG TERM GOAL #4   Title  Pt. will demonstrate cognitive compensatory strategies 100% of the time during ADLs, and IADLs.    Baseline  Eval: Pt. is unable    Time  12    Period  Weeks    Status  New    Target Date  09/18/17      OT LONG TERM GOAL #5   Title  Pt. will complete IADL, home management tasks with Supervision.     Baseline  Eval: Dependent    Time  12    Period  Weeks    Status  New    Target Date  09/18/17            Plan - 07/25/17 1715    Clinical Impression Statement  Pt. conitnues to have bilateral shoulder pain, with the left shoulder being greater than the right. Pt. reports pain is always at its worse when laying down during the night. Pt. reports having an MD appoinmtment in a couple of weeks. Pt. reports she has been doing UE exercises with green theraband that was provided to her at [facility] when she was  first hospitalized there. Pt. was advised not to do resistive shoulder exercises at this time. Pt. tolerated session today, and reports feeling better. Pt. continues to work on improving UE functioing for ADLs, and IADLs.    Occupational performance deficits (Please refer to evaluation for details):  ADL's;IADL's    Rehab Potential  Good    Current Impairments/barriers affecting progress:  Positive indicators: age, family support, motivation, Negative indicators: multiple comorbidities.    OT Frequency  2x / week    OT Duration  12 weeks    OT Treatment/Interventions  Self-care/ADL training;Neuromuscular education;Therapeutic activities;Cognitive remediation/compensation;Passive range of motion;DME and/or AE instruction;Patient/family education;Energy conservation;Therapeutic exercise;Manual Therapy    Clinical Decision Making  Multiple treatment options, significant modification of task necessary    Consulted and Agree with Plan of Care  Patient       Patient will benefit from skilled therapeutic intervention in order to improve the following deficits and impairments:  Pain, Impaired UE functional use, Decreased knowledge of precautions, Decreased cognition, Impaired tone, Decreased strength, Decreased endurance, Decreased activity tolerance, Decreased knowledge of use of DME, Decreased balance  Visit Diagnosis: Muscle weakness (generalized)    Problem List There are no active problems to display for this patient.   [name], MS, OTR/L 07/25/2017, 5:23 PM  [facility] MAIN [facility] SERVICES [address] [place], [place], [address] Phone: [phone]   Fax:  [phone]  Name: Haley Lucas MRN: [id number] Date of Birth: 12-17-1950

## 2017-07-25 NOTE — Therapy (Addendum)
[facility] MAIN [facility] SERVICES [address] [place], [place], [address] Phone: [phone]   Fax:  [phone] Physical Therapy Treatment  Patient Details  Name: Haley Lucas MRN: [id number] Date of Birth: [birth year] Referring Provider: Marshell Garfinkel    Encounter Date: 07/25/2017  PT End of Session - 07/25/17 1114    Visit Number  7    Number of Visits  17    Date for PT Re-Evaluation  08/21/17    PT Start Time  1100    PT Stop Time  1145    PT Time Calculation (min)  45 min    Equipment Utilized During Treatment  Gait belt    Activity Tolerance  Patient tolerated treatment well;Patient limited by pain;Patient limited by fatigue    Behavior During Therapy  [facility] for tasks assessed/performed       Past Medical History:  Diagnosis Date  . High cholesterol   . Hypertension   . Thyroid disease     History reviewed. No pertinent surgical history.  There were no vitals filed for this visit.  Subjective Assessment - 07/25/17 1113    Subjective  Pt states she is doing well today. She reports pain continues in her left  shoulder that is constant and  right shoulder is now getting to be much less pain.  Pt denies dizziness.     Pertinent History  She had a hughes flap to her eye right eye fe[facility] 18[facility]. She was in the hospital for a month from the first surgey and was discharged Dec 11th. She had PT and OT for  2 weeks and then HHPT PT, OT and ST.  Tha ended christmas. She was not using a Assistive devie prior to surgery. She has weakness in her arms and she is not able to walk her normal distances.     Limitations  Standing    How long can you stand comfortably?  10 mins    Patient Stated Goals  to be able to walk and stand for longer periods of time, and improve balance.     Currently in Pain?  Yes    Pain Score  8     Pain Location  Shoulder    Pain Orientation  Left    Pain Descriptors / Indicators  Aching    Pain Type  Chronic pain     Pain Onset  More than a month ago    Pain Frequency  Constant    Aggravating Factors   rolling    Pain Relieving Factors  sitting    Effect of Pain on Daily Activities  difficult to sleep in bed    Multiple Pain Sites  No            Treatment: Standing on airex beam EO and EC x 1 min Tandem stance on airex beam without rail assist 10 sec hold x4 each foot in front with CGA for safety and cues to improve upper trunk control for better balance control; Side stepping down airex beam without rail assist x3 laps each direction with cues to keep feet on beam and avoid stepping off; Standing with feet apart, BUE ball toss x10 unsupported with close supervision; Patient exhibits posterior loss of balance requiring cues for forward weight shift;  Standing on 1/2 bolster (flat side up) Feet flat, apart, BUE wand flexion x10 reps with min A For safety; Patient able to keep balance well with minimal posterior loss of  balance Matrix side stepping and fwd/bwd stepping x 3 reps Heel raises 2 x 10; cues to not rock forward Eccentric step downs x 10 BLE; cues to not put too much weight bearing on UE's Squats x 10 with 5 sec hold, cues to maintain erect position Heel raises x 10 x 2 ; cues to not rock forward Resisted side-steeping RTB 4 lengths x 2;; cues to not rotate trunk towards direction of mobility Standing mini squats 2 x 10 with RTB around knees to encourage abduction; cues to maintain correct erect posture Sit to stand without UE support 2 x 10; cues for technique and posture correction Step-ups to 6" step x 10 bilateral; cues for getting entire foot on step  CGA and Min to mod verbal cues used throughout with increased in postural sway and LOB most seen with narrow base of support and while on uneven surfaces. Continues to have balance deficits typical with diagnosis. Patient performs intermediate level exercises without pain behaviors and needs verbal cuing for postural alignment and  head positioning                      PT Education - 07/25/17 1114    Education provided  Yes    Education Details  HEP    Person(s) Educated  Patient    Methods  Explanation;Demonstration;Tactile cues;Verbal cues    Comprehension  Verbalized understanding;Returned demonstration       PT Short Term Goals - 06/26/17 1029      PT SHORT TERM GOAL #1   Title  Patient will be independent in home exercise program to improve strength/mobility for better functional independence with ADLs.    Time  4    Period  Weeks    Status  New    Target Date  07/24/17      PT SHORT TERM GOAL #2   Title  Patient (> 66 years old) will complete five times sit to stand test in < 15 seconds indicating an increased LE strength and improved balance.    Time  4    Period  Weeks    Target Date  07/24/17        PT Long Term Goals - 06/26/17 1030      PT LONG TERM GOAL #1   Title  Patient will increase six minute walk test distance to >1000 for progression to community ambulator and improve gait ability    Time  8    Period  Weeks    Status  New    Target Date  08/21/17      PT LONG TERM GOAL #2   Title  Patient will increase BLE gross strength to 4+/5 as to improve functional strength for independent gait, increased standing tolerance and increased ADL ability.    Time  8    Period  Weeks    Status  New    Target Date  08/21/17      PT LONG TERM GOAL #3   Title  Patient will ascend/descend 4 stairs without rail assist independently without loss of balance to improve ability to get in/out of home.     Time  8    Period  Weeks    Status  New    Target Date  08/21/17      PT LONG TERM GOAL #4   Title  Patient will reduce timed up and go to <11 seconds to reduce fall risk and demonstrate improved transfer/gait ability.  Time  8    Period  Weeks    Status  New    Target Date  08/21/17            Plan - 07/25/17 1115    Clinical Impression Statement  Patient  demonstrates deficits with postural control in tandem and narrow stance on purple foam with EO and EC. Patient demonstrated hesitation with full weight shifting during lunge but minimal cueing resulted in good technique and no LOB.  Patient improved ability to challenge dynamic balance with supervision and without UE assist today.  Patient shows good BLE strength and hip control during DL leg press. Patient will continue to benefit from skilled physical therapy to improve endurance and dynamic balance to reduce fall risk.    Rehab Potential  Good    PT Frequency  2x / week    PT Duration  8 weeks    PT Treatment/Interventions  Gait training;Therapeutic exercise;Therapeutic activities;Stair training;Balance training;Neuromuscular re-education;Patient/family education;Manual techniques;Aquatic Therapy;Moist Heat;Electrical Stimulation    PT Next Visit Plan  balance and therapeutic exercise for hip weakness    Consulted and Agree with Plan of Care  Patient;Family member/caregiver       Patient will benefit from skilled therapeutic intervention in order to improve the following deficits and impairments:  Abnormal gait, Decreased balance, Decreased endurance, Decreased mobility, Difficulty walking, Decreased knowledge of precautions, Decreased activity tolerance, Decreased coordination, Decreased safety awareness, Decreased strength, Impaired flexibility, Postural dysfunction  Visit Diagnosis: Muscle weakness (generalized)  Unsteadiness on feet  Other lack of coordination  Low vision, both eyes  Difficulty in walking, not elsewhere classified     Problem List There are no active problems to display for this patient.   [address]Ardella Chhim S, [place] DPT 07/25/2017, 11:18 AM  [facility]IN [facility] [address] [place], [place], [address] Phone: [phone]   Fax:  [phone]  Name: Lavella LemonsHyun S Cella MRN: [id number] Date of Birth: 02/07/1951

## 2017-07-31 ENCOUNTER — Ambulatory Visit: Payer: Medicare Other | Admitting: Occupational Therapy

## 2017-07-31 ENCOUNTER — Encounter: Payer: Self-pay | Admitting: Physical Therapy

## 2017-07-31 ENCOUNTER — Ambulatory Visit: Payer: Medicare Other | Admitting: Physical Therapy

## 2017-07-31 DIAGNOSIS — R262 Difficulty in walking, not elsewhere classified: Secondary | ICD-10-CM

## 2017-07-31 DIAGNOSIS — H543 Unqualified visual loss, both eyes: Secondary | ICD-10-CM

## 2017-07-31 DIAGNOSIS — R2681 Unsteadiness on feet: Secondary | ICD-10-CM

## 2017-07-31 DIAGNOSIS — M6281 Muscle weakness (generalized): Secondary | ICD-10-CM

## 2017-07-31 DIAGNOSIS — R278 Other lack of coordination: Secondary | ICD-10-CM

## 2017-07-31 NOTE — Therapy (Signed)
[address] MAIN [facility] SERVICES [address] [place], [place], [address] Phone: [phone]   Fax:  [phone]  Physical Therapy Treatment/Physical Therapy Progress Note   Dates of reporting period  06/26/17  to   07/31/17  Patient Details  Name: Haley Lucas MRN: [id number] Date of Birth: Oct 24, 1950 Referring Provider: Janalyn Lucas    Encounter Date: 07/31/2017  PT End of Session - 07/31/17 1100    Visit Number  8    Number of Visits  17    Date for PT Re-Evaluation  08/21/17    PT Start Time  1100    PT Stop Time  1140    PT Time Calculation (min)  40 min    Equipment Utilized During Treatment  Gait belt    Activity Tolerance  Patient tolerated treatment well;Patient limited by pain;Patient limited by fatigue    Behavior During Therapy  [facility] for tasks assessed/performed       Past Medical History:  Diagnosis Date  . High cholesterol   . Hypertension   . Thyroid disease     History reviewed. No pertinent surgical history.  There were no vitals filed for this visit.  Subjective Assessment - 07/31/17 1104    Subjective  Pt states she is doing well today. She reports pain continues in her left  shoulder that is constant and  right shoulder is now getting to be much less pain.  Pt denies dizziness.     Pertinent History  She had a hughes flap to her eye right eye feb 18th at [facility]. She was in the hospital for a month from the first surgey and was discharged Dec 11th. She had PT and OT for  2 weeks and then HHPT PT, OT and ST.  Tha ended christmas. She was not using a Assistive devie prior to surgery. She has weakness in her arms and she is not able to walk her normal distances.     Limitations  Standing    How long can you stand comfortably?  10 mins    Patient Stated Goals  to be able to walk and stand for longer periods of time, and improve balance.     Currently in Pain?  Yes    Pain Score  8     Pain Location  Shoulder    Pain  Descriptors / Indicators  Aching    Pain Type  Chronic pain    Pain Onset  More than a month ago    Pain Frequency  Constant    Aggravating Factors   pain with sying down    Pain Relieving Factors  sitting    Effect of Pain on Daily Activities  difficutl to sleep    Multiple Pain Sites  No       Ther-ex Nustep BUE/BLE level 1 x 5 min for warm-up during history (4 minutes unbilled);  Quantum leg press BLE 90 # x 15 x 2  with cues to slow down LE movement for better strengthening;  Sit to stand from regular height chair with Airex on seat without UE support 2 x 5, heavy cues to increase speed, utilize momentum, and increase anterior weight shifting;    Neuromuscular Re-education Side stepping in // bars; Side stepping with horizontal head turns; Side stepping on Airex balance beam; Side stepping on Airex balance beam with horizontal head turns; Toe taps to 5" step without UE support; Cone taps with therapist calling out laterality and  color of cone;   Verbal cues provided throughout session to correct exercise technique and target specific muscles                         PT Education - 07/31/17 1059    Education provided  Yes    Education Details  HEP    Person(s) Educated  Patient    Methods  Explanation;Demonstration;Tactile cues    Comprehension  Verbalized understanding;Returned demonstration       PT Short Term Goals - 07/31/17 1107      PT SHORT TERM GOAL #1   Title  Patient will be independent in home exercise program to improve strength/mobility for better functional independence with ADLs.    Baseline  Patient is doing well with her exercises    Time  4    Period  Weeks    Status  On-going    Target Date  07/24/17      PT SHORT TERM GOAL #2   Title  Patient (> 55 years old) will complete five times sit to stand test in < 15 seconds indicating an increased LE strength and improved balance.    Baseline  22.19 sec 07/31/17    Time  4     Period  Weeks    Status  Partially Met    Target Date  07/24/17        PT Long Term Goals - 07/31/17 1110      PT LONG TERM GOAL #1   Title  Patient will increase six minute walk test distance to >1000 for progression to community ambulator and improve gait ability    Baseline  07/31/17 1085 feet    Time  8    Period  Weeks    Status  Partially Met    Target Date  08/21/17      PT LONG TERM GOAL #2   Title  Patient will increase BLE gross strength to 4+/5 as to improve functional strength for independent gait, increased standing tolerance and increased ADL ability.    Baseline  3+/5 BLE hips,     Time  8    Period  Weeks    Status  New      PT LONG TERM GOAL #3   Title  Patient will ascend/descend 4 stairs without rail assist independently without loss of balance to improve ability to get in/out of home.     Baseline  Definite need of railings and slow and guarded    Time  8    Period  Weeks    Status  Partially Met    Target Date  08/21/17      PT LONG TERM GOAL #4   Title  Patient will reduce timed up and go to <11 seconds to reduce fall risk and demonstrate improved transfer/gait ability.    Baseline  19.87 sec 07/31/17    Time  8    Period  Weeks    Status  Partially Met    Target Date  08/21/17            Plan - 07/31/17 1100    Clinical Impression Statement Patient's condition has the potential to improve in response to therapy. Maximum improvement is yet to be obtained. The anticipated improvement is attainable and reasonable in a generally predictable time. Start date of reporting period 06/26/17 end date of reporting period 07/31/17  Patient reports that she is walking better and is able to  ascend and descend the steps easier. She performed her outcome measures and is making progress towards all goals with increased strength and decreased falls risk and improved ambulation.   Pt presents with unsteadiness on uneven surfaces and fatigues with therapeutic exercises.  Patient needs assist with single leg balance activities and needs CGA assist with longer single leg timed standing activities. Patient demonstrates difficulty with dynamic standing balance and increased postural sway while on 1/2 foam , and rocker board with decreased base of support and increased challenges for UE.  Patient tolerated all interventions well this date and will benefit from continued skilled PT interventions to improve strength and balance and decrease risk of falling    Rehab Potential  Good    PT Frequency  2x / week    PT Duration  8 weeks    PT Treatment/Interventions  Gait training;Therapeutic exercise;Therapeutic activities;Stair training;Balance training;Neuromuscular re-education;Patient/family education;Manual techniques;Aquatic Therapy;Moist Heat;Electrical Stimulation    PT Next Visit Plan  balance and therapeutic exercise for hip weakness    Consulted and Agree with Plan of Care  Patient;Family member/caregiver       Patient will benefit from skilled therapeutic intervention in order to improve the following deficits and impairments:  Abnormal gait, Decreased balance, Decreased endurance, Decreased mobility, Difficulty walking, Decreased knowledge of precautions, Decreased activity tolerance, Decreased coordination, Decreased safety awareness, Decreased strength, Impaired flexibility, Postural dysfunction  Visit Diagnosis: Muscle weakness (generalized)  Unsteadiness on feet  Other lack of coordination  Low vision, both eyes  Difficulty in walking, not elsewhere classified     Problem List There are no active problems to display for this patient.   [address] , [place] DPT 07/31/2017, 11:26 AM  [place] MAIN [facility] SERVICES [address] [place], [place], [address] Phone: [phone]   Fax:  [phone]  Name: LAVERGNE HILTUNEN MRN: [id number] Date of Birth: Nov 18, 1950

## 2017-07-31 NOTE — Therapy (Signed)
[facility] [facility] MAIN [facility]REHAB SERVICES [address] [place], [place], [address] Phone: [phone]   Fax:  [phone]  Occupational Therapy Treatment  Patient Details  Name: Haley LemonsHyun S Lucas MRN: [id number] Date of Birth: 06/10/1950 Referring Provider: Marshell GarfinkelO, SARAH J    Encounter Date: 07/31/2017  OT End of Session - 07/31/17 1143    Visit Number  7    Number of Visits  24    Date for OT Re-Evaluation  09/18/17    Authorization Type  visit 7/10 for progress report period starting 06/26/2017    OT Start Time  1015    OT Stop Time  1100    OT Time Calculation (min)  45 min    Activity Tolerance  Patient tolerated treatment well    Behavior During Therapy  [facility] for tasks assessed/performed       Past Medical History:  Diagnosis Date  . High cholesterol   . Hypertension   . Thyroid disease     No past surgical history on file.  There were no vitals filed for this visit.  Subjective Assessment - 07/31/17 1140    Subjective   Pt. at session alone today.    Pertinent History  Pt is a 67 y.o. female who [facility]. Pt. received inpatient rehabilitation services followed by home health services. Pt. had surgery to repair a right drooping eyelid on February 19th., 2019. Pt. is ready for outpatient OT services.    Patient Stated Goals  OT services    Currently in Pain?  Yes    Pain Score  5  Pain fluctuates, with the most pain being at night.    Pain Location  Shoulder    Pain Orientation  Left    Pain Descriptors / Indicators  Aching Pt. reports pain at night when trying to ssleep.    Pain Type  Chronic pain    Pain Onset  More than a month ago    Pain Relieving Factors  sitting. ROM      OT TREATMENT    Therapeutic Exercise:  Pt. Tolerated AROM in all joint ranges of the LUE, and hand. Pt. tolerated  Gentle scapular elevation, depression, abduction/adduction, retraction, external, and internal rotation.  Pt. Worked on AAROM with a swiss ball standing, pt. Worked out at a flat surface at [employer] mat, followed by at an incline with a wedge. Pt. Worked on reaching using the shape tower, and the [employer]sa[employer]o tower. Pt. Was able to achieve all horizontal rungs and vertical dowels.                         OT Education - 07/31/17 1732    Education provided  Yes    Education Details  BUE ROM    Person(s) Educated  Patient    Methods  Explanation;Tactile cues;Demonstration    Comprehension  Verbalized understanding;Returned demonstration          OT Long Term Goals - 06/26/17 1205      OT LONG TERM GOAL #1   Title  Pt. will increase UE strength by 2 mm grades to assist with ADLs, and IADL    Baseline  Eval: Limited BUE strength    Time  12    Period  Weeks    Status  New    Target Date  09/18/17      OT LONG TERM GOAL #2   Title  Pt. will improve right hand [facility] skills by 3 sec. to be able to manipulate ADL items.    Baseline  Eval: Right: 47 sec.    Time  12    Period  Weeks    Status  New    Target Date  09/18/17      OT LONG TERM GOAL #3   Title  Pt. will demonstrate visual compensatory strategies 100% of the time during ADLs, and IADLs.    Baseline  EVal: Pt. unable    Time  12    Period  Weeks    Status  New    Target Date  09/18/17      OT LONG TERM GOAL #4   Title  Pt. will demonstrate cognitive compensatory strategies 100% of the time during ADLs, and IADLs.    Baseline  Eval: Pt. is unable    Time  12    Period  Weeks    Status  New    Target Date  09/18/17      OT LONG TERM GOAL #5   Title  Pt. will complete IADL, home management tasks with Supervision.     Baseline  Eval: Dependent    Time  12    Period  Weeks    Status  New    Target Date  09/18/17            Plan - 07/31/17 1144    Clinical Impression Statement  Pt. is responding well to the treatment session. Pt. was able to achieve increased LUE ROM, decreased pain, and improved  functional reaching. Pt. continues work on improving UE ROM, decreasing shoulder pain, and improving functional use during ADLs.     Occupational Profile and client history currently impacting functional performance  Pt. 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    Occupational performance deficits (Please refer to evaluation for details):  ADL's;IADL's    Rehab Potential  Good    Current Impairments/barriers affecting progress:  Positive indicators: age, family support, motivation, Negative indicators: multiple comorbidities.    OT Frequency  2x / week    OT Duration  12 weeks    OT Treatment/Interventions  Self-care/ADL training;Neuromuscular education;Therapeutic activities;Cognitive remediation/compensation;Passive range of motion;DME and/or AE instruction;Patient/family education;Energy conservation;Therapeutic exercise;Manual Therapy    Clinical Decision Making  Multiple treatment options, significant modification of task necessary    Consulted and Agree with Plan of Care  Patient       Patient will benefit from skilled therapeutic intervention in order to improve the following deficits and impairments:  Pain, Impaired UE functional use, Decreased knowledge of precautions, Decreased cognition, Impaired tone, Decreased strength, Decreased endurance, Decreased activity tolerance, Decreased knowledge of use of DME, Decreased balance  Visit Diagnosis: Muscle weakness (generalized)    Problem List There are no active problems to display for this patient.   [name], MSL OTR/L 07/31/2017, 5:32 PM  [place] [facility] MAIN [facility] SERVICES [address] [place], [place], [address] Phone: [phone]   Fax:  [phone]  Name: Haley Lucas MRN: [id number] Date of Birth: [birth year]

## 2017-08-05 ENCOUNTER — Encounter: Payer: Self-pay | Admitting: Physical Therapy

## 2017-08-05 ENCOUNTER — Ambulatory Visit: Payer: Medicare Other | Admitting: Occupational Therapy

## 2017-08-05 ENCOUNTER — Ambulatory Visit: Payer: Medicare Other | Admitting: Physical Therapy

## 2017-08-05 ENCOUNTER — Encounter: Payer: Self-pay | Admitting: Occupational Therapy

## 2017-08-05 DIAGNOSIS — M6281 Muscle weakness (generalized): Secondary | ICD-10-CM

## 2017-08-05 DIAGNOSIS — R278 Other lack of coordination: Secondary | ICD-10-CM

## 2017-08-05 DIAGNOSIS — H543 Unqualified visual loss, both eyes: Secondary | ICD-10-CM

## 2017-08-05 DIAGNOSIS — R2681 Unsteadiness on feet: Secondary | ICD-10-CM

## 2017-08-05 DIAGNOSIS — R262 Difficulty in walking, not elsewhere classified: Secondary | ICD-10-CM

## 2017-08-05 NOTE — Therapy (Signed)
[place] MAIN [facility] SERVICES [address] [place], [place], [address] Phone: [phone]   Fax:  [phone]  Physical Therapy Treatment  Patient Details  Name: Haley Lucas MRN: [id number] Date of Birth: March 30, 1951 Referring Provider: [name]    Encounter Date: 08/05/2017  PT End of Session - 08/05/17 1110    Visit Number  9    Number of Visits  17    Date for PT Re-Evaluation  08/21/17    PT Start Time  1101    PT Stop Time  1145    PT Time Calculation (min)  44 min    Equipment Utilized During Treatment  Gait belt    Activity Tolerance  Patient tolerated treatment well;Patient limited by pain;Patient limited by fatigue    Behavior During Therapy  [facility] for tasks assessed/performed       Past Medical History:  Diagnosis Date  . High cholesterol   . Hypertension   . Thyroid disease     History reviewed. No pertinent surgical history.  There were no vitals filed for this visit.  Subjective Assessment - 08/05/17 1102    Subjective  Pt states she is doing well today. She reports pain continues in her left  shoulder that is constant and  right shoulder is now getting to be much less pain.  Pt denies dizziness.     Pertinent History  She had a hughes flap to her eye right eye feb 18th at [facility]. She was in the hospital for a month from the first surgey and was discharged Dec 11th. She had PT and OT for  2 weeks and then HHPT PT, OT and ST.  Tha ended christmas. She was not using a Assistive devie prior to surgery. She has weakness in her arms and she is not able to walk her normal distances.     Limitations  Standing    How long can you stand comfortably?  10 mins    Patient Stated Goals  to be able to walk and stand for longer periods of time, and improve balance.     Currently in Pain?  Yes    Pain Score  7     Pain Location  Shoulder    Pain Orientation  Left    Pain Descriptors / Indicators  Aching    Pain Type  Chronic  pain    Pain Onset  More than a month ago    Pain Frequency  Constant    Aggravating Factors   lying on side    Pain Relieving Factors  sitting    Effect of Pain on Daily Activities  difficult    Multiple Pain Sites  No       NEUROMUSCULAR RE-EDUCATION Gait training on TM with 1. 5 miles / hour speeds and with and without UE support x 10 min Airex NBOS eyes open/closed x 30 seconds each; Airex NBOS eyes open horizontal and vertical head turns x 30 seconds; Airex cone taps alternating LE x 60 seconds; Tandem gait in // bars x 4 laps, cues for posture correction Side stepping on blue  foam balance beam x 5 lengths of the parallel bars with posture correction cues Matrix with 2 plates fwd/bwd, side to side stepping/ diagonal stepping, cues to take longer steps Standing on foam NBOS  reaching  For cones and crossing midline and stacking, cues for weight shift and turning her head Alternating toe taps up to 8" step from  airex with cues for soft toe tapping for greater control.  Leg press 90 lbs x 20 x 3, cues to slow movement down  Cues for proper technique of exercises, slow eccentric contractions to target specific muscles and facility increased muscle building. Therapeutic rest breaks for energy conservation                            PT Education - 08/05/17 1104    Education provided  Yes    Education Details  exercise techniques    Person(s) Educated  Patient    Methods  Explanation;Demonstration;Tactile cues    Comprehension  Verbalized understanding;Returned demonstration;Verbal cues required       PT Short Term Goals - 07/31/17 1107      PT SHORT TERM GOAL #1   Title  Patient will be independent in home exercise program to improve strength/mobility for better functional independence with ADLs.    Baseline  Patient is doing well with her exercises    Time  4    Period  Weeks    Status  On-going    Target Date  07/24/17      PT SHORT TERM GOAL #2    Title  Patient (> 16 years old) will complete five times sit to stand test in < 15 seconds indicating an increased LE strength and improved balance.    Baseline  22.19 sec 07/31/17    Time  4    Period  Weeks    Status  Partially Met    Target Date  07/24/17        PT Long Term Goals - 07/31/17 1110      PT LONG TERM GOAL #1   Title  Patient will increase six minute walk test distance to >1000 for progression to community ambulator and improve gait ability    Baseline  07/31/17 1085 feet    Time  8    Period  Weeks    Status  Partially Met    Target Date  08/21/17      PT LONG TERM GOAL #2   Title  Patient will increase BLE gross strength to 4+/5 as to improve functional strength for independent gait, increased standing tolerance and increased ADL ability.    Baseline  3+/5 BLE hips,     Time  8    Period  Weeks    Status  New      PT LONG TERM GOAL #3   Title  Patient will ascend/descend 4 stairs without rail assist independently without loss of balance to improve ability to get in/out of home.     Baseline  Definite need of railings and slow and guarded    Time  8    Period  Weeks    Status  Partially Met    Target Date  08/21/17      PT LONG TERM GOAL #4   Title  Patient will reduce timed up and go to <11 seconds to reduce fall risk and demonstrate improved transfer/gait ability.    Baseline  19.87 sec 07/31/17    Time  8    Period  Weeks    Status  Partially Met    Target Date  08/21/17            Plan - 08/05/17 1110    Clinical Impression Statement  Patient   instructed in gait training on level surfaces on  TM with rest periods  standing. Patient is able to perform intermediate LE exercises in supine and standing open and closed chain, with minimal pain behaviors and resting throughout session. She performs dynamic standing balance training with uneven surfaces with CGA. Patient will continue to benefit from skilled PT to improve strength, balance and gait.  Patient continues to have LE weakness and decreased static and dynamic standing balance.    Rehab Potential  Good    PT Frequency  2x / week    PT Duration  8 weeks    PT Treatment/Interventions  Gait training;Therapeutic exercise;Therapeutic activities;Stair training;Balance training;Neuromuscular re-education;Patient/family education;Manual techniques;Aquatic Therapy;Moist Heat;Electrical Stimulation    PT Next Visit Plan  balance and therapeutic exercise for hip weakness    Consulted and Agree with Plan of Care  Patient;Family member/caregiver       Patient will benefit from skilled therapeutic intervention in order to improve the following deficits and impairments:  Abnormal gait, Decreased balance, Decreased endurance, Decreased mobility, Difficulty walking, Decreased knowledge of precautions, Decreased activity tolerance, Decreased coordination, Decreased safety awareness, Decreased strength, Impaired flexibility, Postural dysfunction  Visit Diagnosis: Muscle weakness (generalized)  Other lack of coordination  Unsteadiness on feet  Low vision, both eyes  Difficulty in walking, not elsewhere classified     Problem List There are no active problems to display for this patient.   [address], [place] DPT 08/05/2017, 11:15 AM  [place] MAIN [facility] SERVICES [address] [place], [place], [address] Phone: [phone]   Fax:  [phone]  Name: Haley Lucas MRN: [id number] Date of Birth: 1951/03/28

## 2017-08-05 NOTE — Therapy (Signed)
[facility] MAIN [facility] SERVICES [address] [place], [place], [address] Phone: [phone]   Fax:  [phone]  Occupational Therapy Treatment  Patient Details  Name: Haley Lucas MRN: [id number] Date of Birth: 1950/09/21 Referring Provider: Marshell Garfinkel    Encounter Date: 08/05/2017  OT End of Session - 08/05/17 1043    Visit Number  8    Number of Visits  24    Date for OT Re-Evaluation  09/18/17    Authorization Type  visit 8/10 for progress report period starting 06/26/2017    OT Start Time  1015    OT Stop Time  1100    OT Time Calculation (min)  45 min    Activity Tolerance  Patient tolerated treatment well    Behavior During Therapy  [facility] for tasks assessed/performed       Past Medical History:  Diagnosis Date  . High cholesterol   . Hypertension   . Thyroid disease     History reviewed. No pertinent surgical history.  There were no vitals filed for this visit.  Subjective Assessment - 08/05/17 1040    Subjective   Pt. reports having had a nice weekend.    Pertinent History  Pt is a 67 y.o. 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 Pt. received inpatient rehabilitation services followed by home health services. Pt. had surgery to repair a right drooping eyelid on February 19th., 2019. Pt. is ready for outpatient OT services.    Patient Stated Goals  OT services    Pain Score  7     Pain Orientation  Left    Pain Descriptors / Indicators  Aching       OT TREATMENT   Therapeutic Exercise:  Pt. Worked on AROM in all joint ranges of the LUE, and hand for scapular elevation, depression, abduction, rotation, retraction, internal, and external rotation.AAROm at the wall, and ladder with bilateral UEs. Pt. Worked on reaching with the right, and left hands. Pt. Worked on reaching with the left using the vertical shape tower. Pt. Worked on moving the shapes to the 3rd rung while at the tabletop. Pt. Used  the RUE to move the shapes through all 4 rungs. Pt. Worked with 2# dumbbell ex. for bilateral elbow flexion and extension,  2# for forearm supination/pronation, wrist flexion/extension, and radial deviation. Pt. requires rest breaks and verbal cues for proper technique.                      OT Education - 08/05/17 1042    Education provided  Yes    Education Details  BUE strengthening     Person(s) Educated  Patient    Methods  Demonstration;Explanation;Tactile cues    Comprehension  Verbalized understanding;Returned demonstration          OT Long Term Goals - 06/26/17 1205      OT LONG TERM GOAL #1   Title  Pt. will increase UE strength by 2 mm grades to assist with ADLs, and IADL    Baseline  Eval: Limited BUE strength    Time  12    Period  Weeks    Status  New    Target Date  09/18/17      OT LONG TERM GOAL #2   Title  Pt. will improve right hand [facility] skills by 3 sec. to be able to manipulate ADL items.    Baseline  Eval:  Right: 47 sec.    Time  12    Period  Weeks    Status  New    Target Date  09/18/17      OT LONG TERM GOAL #3   Title  Pt. will demonstrate visual compensatory strategies 100% of the time during ADLs, and IADLs.    Baseline  EVal: Pt. unable    Time  12    Period  Weeks    Status  New    Target Date  09/18/17      OT LONG TERM GOAL #4   Title  Pt. will demonstrate cognitive compensatory strategies 100% of the time during ADLs, and IADLs.    Baseline  Eval: Pt. is unable    Time  12    Period  Weeks    Status  New    Target Date  09/18/17      OT LONG TERM GOAL #5   Title  Pt. will complete IADL, home management tasks with Supervision.     Baseline  Eval: Dependent    Time  12    Period  Weeks    Status  New    Target Date  09/18/17            Plan - 08/05/17 1044    Clinical Impression Statement  Pt. reports the sensation, numbness, and tingling  in the right side of her face is stronger today. Pt. reports having  follow-up appointent with her physician at the end of the week. Pt. continues to hve left shoulder pain, however responds well to ROM, stretching, and reaching tasks.  Pt. continues to work on improving LUE functioning for iimproved engagement during ADLs, and IADLs.    Occupational Profile and client history currently impacting functional performance  Pt. 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    Occupational performance deficits (Please refer to evaluation for details):  ADL's;IADL's    Rehab Potential  Good    Current Impairments/barriers affecting progress:  Positive indicators: age, family support, motivation, Negative indicators: multiple comorbidities.    OT Frequency  2x / week    OT Duration  12 weeks    OT Treatment/Interventions  Self-care/ADL training;Neuromuscular education;Therapeutic activities;Cognitive remediation/compensation;Passive range of motion;DME and/or AE instruction;Patient/family education;Energy conservation;Therapeutic exercise;Manual Therapy    Clinical Decision Making  Multiple treatment options, significant modification of task necessary    Consulted and Agree with Plan of Care  Patient       Patient will benefit from skilled therapeutic intervention in order to improve the following deficits and impairments:  Pain, Impaired UE functional use, Decreased knowledge of precautions, Decreased cognition, Impaired tone, Decreased strength, Decreased endurance, Decreased activity tolerance, Decreased knowledge of use of DME, Decreased balance  Visit Diagnosis: Muscle weakness (generalized)  Other lack of coordination    Problem List There are no active problems to display for this patient.   Haley MessierElaine Ashna Dorough, MS, OTR/L 08/05/2017, 10:56 AM  [facility] [address] [place], [place], [address] Phone: [phone]   Fax:  [phone]  Name: Haley LemonsHyun S Lucas MRN: [id number] Date of  Birth: 01/05/1951

## 2017-08-07 ENCOUNTER — Encounter: Payer: Self-pay | Admitting: Occupational Therapy

## 2017-08-07 ENCOUNTER — Ambulatory Visit: Payer: Medicare Other | Admitting: Occupational Therapy

## 2017-08-07 ENCOUNTER — Encounter: Payer: Self-pay | Admitting: Physical Therapy

## 2017-08-07 ENCOUNTER — Ambulatory Visit: Payer: Medicare Other | Admitting: Physical Therapy

## 2017-08-07 DIAGNOSIS — R2681 Unsteadiness on feet: Secondary | ICD-10-CM

## 2017-08-07 DIAGNOSIS — R278 Other lack of coordination: Secondary | ICD-10-CM

## 2017-08-07 DIAGNOSIS — M6281 Muscle weakness (generalized): Secondary | ICD-10-CM | POA: Diagnosis not present

## 2017-08-07 DIAGNOSIS — H543 Unqualified visual loss, both eyes: Secondary | ICD-10-CM

## 2017-08-07 DIAGNOSIS — R262 Difficulty in walking, not elsewhere classified: Secondary | ICD-10-CM

## 2017-08-07 NOTE — Therapy (Signed)
[place] MAIN [facility] SERVICES [address] [place], [place], [address] Phone: [phone]   Fax:  [phone]  Physical Therapy Treatment/Physical Therapy Progress Note   Dates of reporting period  06/26/17  to   08/07/17  Patient Details  Name: Haley Lucas MRN: [id number] Date of Birth: 18-Aug-1950 Referring Provider: [name]    Encounter Date: 08/07/2017  PT End of Session - 08/07/17 1056    Visit Number  10    Number of Visits  17    Date for PT Re-Evaluation  08/21/17    PT Start Time  1100    PT Stop Time  1145    PT Time Calculation (min)  45 min    Equipment Utilized During Treatment  Gait belt    Activity Tolerance  Patient tolerated treatment well;Patient limited by pain;Patient limited by fatigue    Behavior During Therapy  [facility] for tasks assessed/performed       Past Medical History:  Diagnosis Date  . High cholesterol   . Hypertension   . Thyroid disease     History reviewed. No pertinent surgical history.  There were no vitals filed for this visit.  Subjective Assessment - 08/07/17 1140    Subjective  Pt states she is doing well today. She reports pain continues in her left  shoulder that is constant and  right shoulder is now getting to be much less pain.  Pt denies dizziness.     Pertinent History  She had a hughes flap to her eye right eye feb 18th at [facility]. She was in the hospital for a month from the first surgey and was discharged Dec 11th. She had PT and OT for  2 weeks and then HHPT PT, OT and ST.  Tha ended christmas. She was not using a Assistive devie prior to surgery. She has weakness in her arms and she is not able to walk her normal distances.     Limitations  Standing    How long can you stand comfortably?  10 mins    Patient Stated Goals  to be able to walk and stand for longer periods of time, and improve balance.     Currently in Pain?  Yes    Pain Score  7     Pain Location  Shoulder    Pain  Orientation  Left    Pain Descriptors / Indicators  Aching    Pain Type  Chronic pain    Pain Onset  More than a month ago    Pain Frequency  Constant    Aggravating Factors   lying on side    Pain Relieving Factors  sitting    Effect of Pain on Daily Activities  difficult    Multiple Pain Sites  No      NEUROMUSCULAR RE-EDUCATION 1/2 foam flat side up and balance with head turns left and right feet apart and feet together with min assist and for 2 mins  tandem standing on 1/2 foam  , with min assist and for 1 minute standing hip abd with YTB x 20  , cues for posture side stepping left and right in parallel bars 10 feet x 3, YTB and cues for posture step ups from floor to 6 inch stool x 20 bilateral, needs UE support marching in parallel bars x 20 Tilt board fwd/bwd, side to side left and right Tm walking 1.2 m/hour x 5 mins TM walking side stepping left  and right x . 4 miles / hour Patient needs occasional verbal cueing to improve posture and cueing to correctly perform exercises slowly, holding at end of range to increase motor firing of desired muscle to encourage fatigue.BLE staggered stance anterior/posterior weight shifting on small rockerboard;  CGA, increased difficulty and more unsteadiness with RLE fwd; min cues to increase weight shift over RLE                          PT Education - 08/07/17 1056    Education provided  Yes    Education Details  exercise techniques    Person(s) Educated  Patient    Methods  Explanation;Demonstration    Comprehension  Verbalized understanding;Returned demonstration       PT Short Term Goals - 07/31/17 1107      PT SHORT TERM GOAL #1   Title  Patient will be independent in home exercise program to improve strength/mobility for better functional independence with ADLs.    Baseline  Patient is doing well with her exercises    Time  4    Period  Weeks    Status  On-going    Target Date  07/24/17      PT SHORT  TERM GOAL #2   Title  Patient (> 55 years old) will complete five times sit to stand test in < 15 seconds indicating an increased LE strength and improved balance.    Baseline  22.19 sec 07/31/17    Time  4    Period  Weeks    Status  Partially Met    Target Date  07/24/17        PT Long Term Goals - 07/31/17 1110      PT LONG TERM GOAL #1   Title  Patient will increase six minute walk test distance to >1000 for progression to community ambulator and improve gait ability    Baseline  07/31/17 1085 feet    Time  8    Period  Weeks    Status  Partially Met    Target Date  08/21/17      PT LONG TERM GOAL #2   Title  Patient will increase BLE gross strength to 4+/5 as to improve functional strength for independent gait, increased standing tolerance and increased ADL ability.    Baseline  3+/5 BLE hips,     Time  8    Period  Weeks    Status  New      PT LONG TERM GOAL #3   Title  Patient will ascend/descend 4 stairs without rail assist independently without loss of balance to improve ability to get in/out of home.     Baseline  Definite need of railings and slow and guarded    Time  8    Period  Weeks    Status  Partially Met    Target Date  08/21/17      PT LONG TERM GOAL #4   Title  Patient will reduce timed up and go to <11 seconds to reduce fall risk and demonstrate improved transfer/gait ability.    Baseline  19.87 sec 07/31/17    Time  8    Period  Weeks    Status  Partially Met    Target Date  08/21/17            Plan - 08/07/17 1056    Clinical Impression Statement  Patient's condition has the potential to  improve in response to therapy. Maximum improvement is yet to be obtained. The anticipated improvement is attainable and reasonable in a generally predictable time. Start date of reporting period 06/26/17 end date of reporting period  08/07/17  Patient reports that her arms hurt some and she is walking better.  Patient is making progress towards all goals. Pt was  able to progress through exercises today with decreases in loss of dynamic standing  during reaching activities.  Pt continues to demonstrate improvement with dynamic and static balance, with decreased LOB noted with dynamic activities on even and uneven surfaces.  Pt has decreased  strength and single leg stability.  Pt would continue to benefit from skilled therapy services in order to continue strengthening LE's and improving dynamic and static balance.   Rehab Potential  Good    PT Frequency  2x / week    PT Duration  8 weeks    PT Treatment/Interventions  Gait training;Therapeutic exercise;Therapeutic activities;Stair training;Balance training;Neuromuscular re-education;Patient/family education;Manual techniques;Aquatic Therapy;Moist Heat;Electrical Stimulation    PT Next Visit Plan  balance and therapeutic exercise for hip weakness    Consulted and Agree with Plan of Care  Patient;Family member/caregiver       Patient will benefit from skilled therapeutic intervention in order to improve the following deficits and impairments:  Abnormal gait, Decreased balance, Decreased endurance, Decreased mobility, Difficulty walking, Decreased knowledge of precautions, Decreased activity tolerance, Decreased coordination, Decreased safety awareness, Decreased strength, Impaired flexibility, Postural dysfunction  Visit Diagnosis: Muscle weakness (generalized)  Other lack of coordination  Unsteadiness on feet  Low vision, both eyes  Difficulty in walking, not elsewhere classified     Problem List There are no active problems to display for this patient.   [address] , [place], DPT 08/07/2017, 11:42 AM  [place] MAIN [facility] SERVICES [address] [place], [place], [address] Phone: [phone]   Fax:  [phone]  Name: Haley Lucas MRN: [id number] Date of Birth: Jul 14, 1950

## 2017-08-07 NOTE — Therapy (Signed)
[place] [facility] MAIN [facility] SERVICES [address] [place], [place], [address] Phone: [phone]   Fax:  [phone]  Occupational Therapy Treatment  Patient Details  Name: Haley Lucas MRN: [id number] Date of Birth: March 03, 1951 Referring Provider: Marshell Garfinkel    Encounter Date: 08/07/2017  OT End of Session - 08/07/17 1104    Visit Number  9    Number of Visits  24    Date for OT Re-Evaluation  09/18/17    Authorization Type  visit 9/10 for progress report period starting 06/26/2017    OT Start Time  1015    OT Stop Time  1055    OT Time Calculation (min)  40 min    Activity Tolerance  Patient tolerated treatment well    Behavior During Therapy  [facility] for tasks assessed/performed       Past Medical History:  Diagnosis Date  . High cholesterol   . Hypertension   . Thyroid disease     History reviewed. No pertinent surgical history.  There were no vitals filed for this visit.  Subjective Assessment - 08/07/17 1059    Subjective   Pt. reports she has an appointment for her face tomorrow in [facility].    Pertinent History  Pt is a 67 y.o. 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 Pt. received inpatient rehabilitation services followed by home health services. Pt. had surgery to repair a right drooping eyelid on February 19th., 2019. Pt. is ready for outpatient OT services.    Patient Stated Goals  OT services    Currently in Pain?  Yes    Pain Score  7     Pain Frequency  Intermittent intermittently throughout the day, consistently at night.    Aggravating Factors   supine       OT TREATMENT   Therapeutic Exercise:  Pt. Worked on AROM in all joint ranges of the LUE, and hand for scapular elevation, depression, abduction, rotation, retraction, internal, and external rotation. AAROM at the wall, and ladder with bilateral UEs. Pt. worked on reaching with the right, and left hands. Pt. worked on reaching with the  left using the vertical shape tower. Pt. Worked on moving the shapes to the 3rd rung while at the tabletop. Pt. used the RUE to move the shapes through the first 3 rungs. Pt. worked on [employer] using the swiss ball at incline angles against gravity, as well as at flat tabletop surface.                              OT Education - 08/07/17 1104    Education provided  Yes    Education Details  UE ther. ex., [facility]    Person(s) Educated  Patient    Methods  Demonstration    Comprehension  Verbalized understanding          OT Long Term Goals - 06/26/17 1205      OT LONG TERM GOAL #1   Title  Pt. will increase UE strength by 2 mm grades to assist with ADLs, and IADL    Baseline  Eval: Limited BUE strength    Time  12    Period  Weeks    Status  New    Target Date  09/18/17      OT LONG TERM GOAL #2   Title  Pt. will improve right hand [facility]  skills by 3 sec. to be able to manipulate ADL items.    Baseline  Eval: Right: 47 sec.    Time  12    Period  Weeks    Status  New    Target Date  09/18/17      OT LONG TERM GOAL #3   Title  Pt. will demonstrate visual compensatory strategies 100% of the time during ADLs, and IADLs.    Baseline  EVal: Pt. unable    Time  12    Period  Weeks    Status  New    Target Date  09/18/17      OT LONG TERM GOAL #4   Title  Pt. will demonstrate cognitive compensatory strategies 100% of the time during ADLs, and IADLs.    Baseline  Eval: Pt. is unable    Time  12    Period  Weeks    Status  New    Target Date  09/18/17      OT LONG TERM GOAL #5   Title  Pt. will complete IADL, home management tasks with Supervision.     Baseline  Eval: Dependent    Time  12    Period  Weeks    Status  New    Target Date  09/18/17            Plan - 08/07/17 1105    Clinical Impression Statement  Pt. continues to present with LUE pain. Pt. consistently has the pain when is supine, and intermittently has pain during ROM with  shoulder flexion, horizontal adduction, internal rotation, and external rotation. Pt. reports pain, and ROM was improved today, Pt. conitnues to work on improving LUE ROM, and functioning during ADLs, and IADLs.    Occupational Profile and client history currently impacting functional performance  Pt. 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    Occupational performance deficits (Please refer to evaluation for details):  ADL's;IADL's    Rehab Potential  Good    Current Impairments/barriers affecting progress:  Positive indicators: age, family support, motivation, Negative indicators: multiple comorbidities.    OT Frequency  2x / week    OT Duration  12 weeks    OT Treatment/Interventions  Self-care/ADL training;Neuromuscular education;Therapeutic activities;Cognitive remediation/compensation;Passive range of motion;DME and/or AE instruction;Patient/family education;Energy conservation;Therapeutic exercise;Manual Therapy    Clinical Decision Making  Multiple treatment options, significant modification of task necessary    Consulted and Agree with Plan of Care  Patient       Patient will benefit from skilled therapeutic intervention in order to improve the following deficits and impairments:  Pain, Impaired UE functional use, Decreased knowledge of precautions, Decreased cognition, Impaired tone, Decreased strength, Decreased endurance, Decreased activity tolerance, Decreased knowledge of use of DME, Decreased balance  Visit Diagnosis: Muscle weakness (generalized)    Problem List There are no active problems to display for this patient.   Haley Lucas Haley Lucas 08/07/2017, 11:18 AM  [place] [facility] [address] [place], [place], [address] Phone: [phone]   Fax:  [phone]  Name: Haley Lucas MRN: [id number] Date of Birth: 12/30/1950

## 2017-08-13 ENCOUNTER — Ambulatory Visit: Payer: Medicare Other | Admitting: Physical Therapy

## 2017-08-13 ENCOUNTER — Ambulatory Visit: Payer: Medicare Other | Admitting: Occupational Therapy

## 2017-08-13 ENCOUNTER — Encounter: Payer: Self-pay | Admitting: Physical Therapy

## 2017-08-13 ENCOUNTER — Encounter: Payer: Self-pay | Admitting: Occupational Therapy

## 2017-08-13 DIAGNOSIS — M6281 Muscle weakness (generalized): Secondary | ICD-10-CM

## 2017-08-13 DIAGNOSIS — R278 Other lack of coordination: Secondary | ICD-10-CM

## 2017-08-13 DIAGNOSIS — H543 Unqualified visual loss, both eyes: Secondary | ICD-10-CM

## 2017-08-13 DIAGNOSIS — R2681 Unsteadiness on feet: Secondary | ICD-10-CM

## 2017-08-13 NOTE — Therapy (Addendum)
[place] MAIN [facility] SERVICES [address] [place], [place], [address] Phone: [phone]   Fax:  [phone]  Physical Therapy Treatment  Patient Details  Name: Haley Lucas MRN: [id number] Date of Birth: 1951-04-14 Referring Provider: [name]    Encounter Date: 08/13/2017  PT End of Session - 08/13/17 1018    Visit Number  11    Number of Visits  17    Date for PT Re-Evaluation  08/21/17    PT Start Time  1100    PT Stop Time  1140    PT Time Calculation (min)  40 min    Equipment Utilized During Treatment  Gait belt    Activity Tolerance  Patient tolerated treatment well;Patient limited by pain;Patient limited by fatigue    Behavior During Therapy  [facility] for tasks assessed/performed       Past Medical History:  Diagnosis Date  . High cholesterol   . Hypertension   . Thyroid disease     History reviewed. No pertinent surgical history.  There were no vitals filed for this visit.  Subjective Assessment - 08/13/17 1023    Subjective  Pt states she is doing well today. She reports pain continues in her left  shoulder that is constant and  right shoulder is now getting to be much less pain.  Pt denies dizziness.     Pertinent History  She had a hughes flap to her eye right eye feb 18th at [facility]. She was in the hospital for a month from the first surgey and was discharged Dec 11th. She had PT and OT for  2 weeks and then HHPT PT, OT and ST.  Tha ended christmas. She was not using a Assistive devie prior to surgery. She has weakness in her arms and she is not able to walk her normal distances.     Limitations  Standing    How long can you stand comfortably?  10 mins    Patient Stated Goals  to be able to walk and stand for longer periods of time, and improve balance.     Currently in Pain?  Yes    Pain Score  7     Pain Location  Shoulder    Pain Orientation  Left    Pain Descriptors / Indicators  Aching    Pain Type  Chronic  pain    Pain Onset  More than a month ago    Pain Frequency  Constant    Aggravating Factors   lying on side    Pain Relieving Factors  sitting    Effect of Pain on Daily Activities  difficult    Multiple Pain Sites  No                     NEUROMUSCULAR RE-EDUCATION Gait training on TM with 1. 5 miles / hour speeds and with and without UE support x 10 min Airex NBOS eyes open/closed x 30 seconds each; Airex NBOS eyes open horizontal and vertical head turns x 30 seconds; Airex cone taps alternating LE x 60 seconds; Tandem gait in // bars x 4 laps, cues for posture correction Side stepping on blue foam balance beam x 5 lengths of the parallel bars with posture correction cues Matrix with 2 platesfwd/bwd, side to side stepping/ diagonal stepping, cues totake longer steps Standing on foam NBOS reaching For cones and crossing midline and stacking, cues forweight shift and turning her head  Alternating toe taps up to 8" step from airex with cues for soft toe tapping for greater control.  Leg press 90 lbs x 20 x 3, cues to slow movement down Purple foam marching x 20 with CGA Purple foam feet together and head turns x 20 with cGA  Cues for proper technique of exercises, slow eccentric contractions to target specific muscles and facility increased muscle building. Therapeutic rest breaks for energy conservation           PT Education - 08/13/17 1018    Education provided  Yes    Education Details  HEP     Person(s) Educated  Patient    Methods  Explanation    Comprehension  Verbalized understanding;Returned demonstration       PT Short Term Goals - 07/31/17 1107      PT SHORT TERM GOAL #1   Title  Patient will be independent in home exercise program to improve strength/mobility for better functional independence with ADLs.    Baseline  Patient is doing well with her exercises    Time  4    Period  Weeks    Status  On-going    Target Date  07/24/17       PT SHORT TERM GOAL #2   Title  Patient (> 67 years old) will complete five times sit to stand test in < 15 seconds indicating an increased LE strength and improved balance.    Baseline  22.19 sec 07/31/17    Time  4    Period  Weeks    Status  Partially Met    Target Date  07/24/17        PT Long Term Goals - 07/31/17 1110      PT LONG TERM GOAL #1   Title  Patient will increase six minute walk test distance to >1000 for progression to community ambulator and improve gait ability    Baseline  07/31/17 1085 feet    Time  8    Period  Weeks    Status  Partially Met    Target Date  08/21/17      PT LONG TERM GOAL #2   Title  Patient will increase BLE gross strength to 4+/5 as to improve functional strength for independent gait, increased standing tolerance and increased ADL ability.    Baseline  3+/5 BLE hips,     Time  8    Period  Weeks    Status  New      PT LONG TERM GOAL #3   Title  Patient will ascend/descend 4 stairs without rail assist independently without loss of balance to improve ability to get in/out of home.     Baseline  Definite need of railings and slow and guarded    Time  8    Period  Weeks    Status  Partially Met    Target Date  08/21/17      PT LONG TERM GOAL #4   Title  Patient will reduce timed up and go to <11 seconds to reduce fall risk and demonstrate improved transfer/gait ability.    Baseline  19.87 sec 07/31/17    Time  8    Period  Weeks    Status  Partially Met    Target Date  08/21/17            Plan - 08/13/17 1019    Clinical Impression Statement  Dynamic and static balance interventions continued today, with a focus  on unilateral LE stability.  Pt tolerated all exercises well, but required increased rest breaks secondary to increased LE fatigue, intermittent pain and weakness today.  LE strength exercises were progressed today with focus on unilateral strength and stability to focus on increasing LE strength and stability with daily  tasks.  Pt was encouraged to perform HEP during the week in order to continue progressing balance and strength interventions.  Pt would continue to benefit from skilled therapy services in order to further address LE strength deficits and balance deficits in order to decrease fall risk and improve mobility.    Rehab Potential  Good    PT Frequency  2x / week    PT Duration  8 weeks    PT Treatment/Interventions  Gait training;Therapeutic exercise;Therapeutic activities;Stair training;Balance training;Neuromuscular re-education;Patient/family education;Manual techniques;Aquatic Therapy;Moist Heat;Electrical Stimulation    PT Next Visit Plan  balance and therapeutic exercise for hip weakness    Consulted and Agree with Plan of Care  Patient;Family member/caregiver       Patient will benefit from skilled therapeutic intervention in order to improve the following deficits and impairments:  Abnormal gait, Decreased balance, Decreased endurance, Decreased mobility, Difficulty walking, Decreased knowledge of precautions, Decreased activity tolerance, Decreased coordination, Decreased safety awareness, Decreased strength, Impaired flexibility, Postural dysfunction  Visit Diagnosis: Muscle weakness (generalized)  Other lack of coordination  Unsteadiness on feet  Low vision, both eyes     Problem List There are no active problems to display for this patient.   [address] [place], [place] DPT 08/13/2017, 12:07 PM  [place] MAIN [facility] SERVICES [address] [place], [place], [address] Phone: [phone]   Fax:  [phone]  Name: Haley Lucas MRN: [id number] Date of Birth: [birth year]

## 2017-08-13 NOTE — Therapy (Signed)
[place] [facility] MAIN [facility] SERVICES [address] [place], [place], [address] Phone: [phone]   Fax:  [phone]  Occupational Therapy Progress Note  Dates of reporting period 06/26/2017 To 08/13/2017    Patient Details  Name: Haley Lucas MRN: [id number] Date of Birth: Aug 17, 1950 Referring Provider: Marshell Garfinkel    Encounter Date: 08/13/2017  OT End of Session - 08/13/17 1652    Visit Number  10    Number of Visits  24    Date for OT Re-Evaluation  09/18/17    Authorization Type  visit 10/10 for progress report period starting 06/26/2017    OT Start Time  1015    OT Stop Time  1055    OT Time Calculation (min)  40 min    Activity Tolerance  Patient tolerated treatment well    Behavior During Therapy  [facility] for tasks assessed/performed       Past Medical History:  Diagnosis Date  . High cholesterol   . Hypertension   . Thyroid disease     History reviewed. No pertinent surgical history.  There were no vitals filed for this visit.  Subjective Assessment - 08/13/17 1650    Subjective   Pt. has an MD appointment on May 14th.    Pertinent History  Pt is a 67 y.o. 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 Pt. received inpatient rehabilitation services followed by home health services. Pt. had surgery to repair a right drooping eyelid on February 19th., 2019. Pt. is ready for outpatient OT services.    Patient Stated Goals  OT services    Currently in Pain?  No/denies    Pain Score  7     Pain Location  Shoulder    Pain Orientation  Left       OT TREATMENT   Therapeutic Exercise:  Pt. worked on AROM in all joint ranges of the LUE in sitting, and standing for scapular elevation, depression, abduction, rotation, retraction, internal, and external rotation. AAROM at the wall, and ladder with bilateral UEs. Pt. worked on reaching with the right, and left hands. Pt. worked on reaching with the left using the  vertical shape tower. Pt. worked on moving the shapes to the 3rd rung while at the tabletop. Pt. used the LUE to move the shapes through the first 3 rungs, and the RUE to move them through the first 3 rungs.                              OT Education - 08/13/17 1651    Education provided  Yes    Education Details  HEP    Person(s) Educated  Patient    Methods  Explanation    Comprehension  Verbalized understanding;Returned demonstration          OT Long Term Goals - 08/13/17 1658      OT LONG TERM GOAL #1   Title  Pt. will increase UE strength by 2 mm grades to assist with ADLs, and IADL    Baseline  Eval: Limited BUE strength UE shoulder strengthening not initiated secondary tobilateral shoulder pain.    Time  12    Period  Weeks    Status  On-going    Target Date  09/18/17      OT LONG TERM GOAL #2   Title  Pt. will improve right hand [facility] skills by  3 sec. to be able to manipulate ADL items.    Baseline  Eval: Right: 47 sec.    Time  12    Period  Weeks    Status  On-going    Target Date  09/18/17      OT LONG TERM GOAL #3   Title  Pt. will demonstrate visual compensatory strategies 100% of the time during ADLs, and IADLs.    Baseline  EVal: Pt. unable    Time  12    Period  Weeks    Status  On-going    Target Date  09/18/17      OT LONG TERM GOAL #4   Title  Pt. will demonstrate cognitive compensatory strategies 100% of the time during ADLs, and IADLs.    Baseline  Eval: Pt. is unable    Time  12    Period  Weeks    Status  On-going    Target Date  09/18/17      OT LONG TERM GOAL #5   Title  Pt. will complete IADL, home management tasks with Supervision.     Baseline  Eval: Dependent    Time  12    Period  Weeks    Status  On-going    Target Date  09/18/17            Plan - 08/13/17 1652    Clinical Impression Statement  Pt. continues to present with 7/10 LUE pain. Pt. responds well to ROM, and has less pain, and increased  ROM following ROM. Pt. continues to present with limited ability to complete basic ADL care, and daily IADL tasks including: meal preparation, and home management tasks. Pt.'s goals have been reviewed. Pt. continues to work on improving UE functioning for improved ADLs, and IADLs.    Occupational Profile and client history currently impacting functional performance  Pt. 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    Occupational performance deficits (Please refer to evaluation for details):  ADL's;IADL's    Rehab Potential  Good    Current Impairments/barriers affecting progress:  Positive indicators: age, family support, motivation, Negative indicators: multiple comorbidities.    OT Frequency  2x / week    OT Duration  12 weeks    OT Treatment/Interventions  Self-care/ADL training;Neuromuscular education;Therapeutic activities;Cognitive remediation/compensation;Passive range of motion;DME and/or AE instruction;Patient/family education;Energy conservation;Therapeutic exercise;Manual Therapy    Clinical Decision Making  Multiple treatment options, significant modification of task necessary    Consulted and Agree with Plan of Care  Patient       Patient will benefit from skilled therapeutic intervention in order to improve the following deficits and impairments:  Pain, Impaired UE functional use, Decreased knowledge of precautions, Decreased cognition, Impaired tone, Decreased strength, Decreased endurance, Decreased activity tolerance, Decreased knowledge of use of DME, Decreased balance  Visit Diagnosis: Muscle weakness (generalized)  Other lack of coordination    Problem List There are no active problems to display for this patient.   [name], MS, OTR/L 08/13/2017, 5:14 PM  [place] [facility] MAIN [facility] SERVICES [address] [place], [place], [address] Phone: [phone]   Fax:  [phone]  Name: Haley Lucas MRN:  [id number] Date of Birth: 04-25-1950

## 2017-08-15 ENCOUNTER — Ambulatory Visit: Payer: Medicare Other | Admitting: Physical Therapy

## 2017-08-15 ENCOUNTER — Ambulatory Visit: Payer: Medicare Other | Attending: Family Medicine | Admitting: Occupational Therapy

## 2017-08-15 DIAGNOSIS — R262 Difficulty in walking, not elsewhere classified: Secondary | ICD-10-CM | POA: Insufficient documentation

## 2017-08-15 DIAGNOSIS — R278 Other lack of coordination: Secondary | ICD-10-CM | POA: Insufficient documentation

## 2017-08-15 DIAGNOSIS — M6281 Muscle weakness (generalized): Secondary | ICD-10-CM | POA: Insufficient documentation

## 2017-08-15 DIAGNOSIS — H543 Unqualified visual loss, both eyes: Secondary | ICD-10-CM | POA: Insufficient documentation

## 2017-08-15 DIAGNOSIS — R2681 Unsteadiness on feet: Secondary | ICD-10-CM | POA: Insufficient documentation

## 2017-08-19 ENCOUNTER — Encounter: Payer: Medicare Other | Admitting: Occupational Therapy

## 2017-08-19 ENCOUNTER — Ambulatory Visit: Payer: Medicare Other | Admitting: Physical Therapy

## 2017-08-21 ENCOUNTER — Ambulatory Visit: Payer: Medicare Other | Admitting: Occupational Therapy

## 2017-08-21 ENCOUNTER — Ambulatory Visit: Payer: Medicare Other

## 2017-08-21 VITALS — BP 156/76 | HR 56

## 2017-08-21 DIAGNOSIS — R262 Difficulty in walking, not elsewhere classified: Secondary | ICD-10-CM | POA: Diagnosis present

## 2017-08-21 DIAGNOSIS — H543 Unqualified visual loss, both eyes: Secondary | ICD-10-CM | POA: Diagnosis present

## 2017-08-21 DIAGNOSIS — R2681 Unsteadiness on feet: Secondary | ICD-10-CM | POA: Diagnosis present

## 2017-08-21 DIAGNOSIS — M6281 Muscle weakness (generalized): Secondary | ICD-10-CM

## 2017-08-21 DIAGNOSIS — R278 Other lack of coordination: Secondary | ICD-10-CM | POA: Diagnosis present

## 2017-08-21 NOTE — Therapy (Signed)
[place] MAIN [facility] SERVICES [address] [place], [place], [address] Phone: [phone]   Fax:  [phone]  Physical Therapy Treatment/Recertification  Dates of reporting period  06/26/17   to   08/21/17  Patient Details  Name: Haley Lucas MRN: [id number] Date of Birth: 07-22-1950 Referring Provider: [name]    Encounter Date: 08/21/2017  PT End of Session - 08/21/17 1030    Visit Number  12    Number of Visits  33    Date for PT Re-Evaluation  10/16/17    PT Start Time  0626    PT Stop Time  1100    PT Time Calculation (min)  45 min    Equipment Utilized During Treatment  Gait belt    Activity Tolerance  Patient tolerated treatment well    Behavior During Therapy  [facility] for tasks assessed/performed       Past Medical History:  Diagnosis Date  . High cholesterol   . Hypertension   . Thyroid disease     History reviewed. No pertinent surgical history.  Vitals:   08/21/17 1019  BP: (!) 156/76  Pulse: (!) 56  SpO2: 99%    Subjective Assessment - 08/21/17 1019    Subjective  Pt states she is doing well today. She reports pain continues in both shoulders today, left is worse than her right. Pt denies dizziness. No recent changes in health. No specific questions or concerns at this time.     Pertinent History  She had a hughes flap to her eye right eye feb 18th at [facility]. She was in the hospital for a month from the first surgey and was discharged Dec 11th. She had PT and OT for  2 weeks and then HHPT PT, OT and ST.  Tha ended christmas. She was not using a Assistive devie prior to surgery. She has weakness in her arms and she is not able to walk her normal distances.     Limitations  Standing    How long can you stand comfortably?  10 mins    Patient Stated Goals  to be able to walk and stand for longer periods of time, and improve balance.     Currently in Pain?  Yes    Pain Score  8     Pain Location  Shoulder    Pain  Orientation  Left    Pain Type  Chronic pain    Pain Onset  More than a month ago    Multiple Pain Sites  Yes    Pain Score  5    Pain Location  Shoulder    Pain Orientation  Right    Pain Type  Chronic pain    Pain Onset  More than a month ago         [facility] PT Assessment - 08/21/17 1028      6 Minute Walk- Baseline   6 Minute Walk- Baseline  yes    BP (mmHg)  156/76    HR (bpm)  60    02 Sat (%RA)  98 %    Modified Borg Scale for Dyspnea  0- Nothing at all    Perceived Rate of Exertion (Borg)  6-      6 Minute walk- Post Test   6 Minute Walk Post Test  yes    BP (mmHg)  153/80    HR (bpm)  60    02 Sat (%RA)  98 %  Modified Borg Scale for Dyspnea  0- Nothing at all    Perceived Rate of Exertion (Borg)  6-      6 minute walk test results    Aerobic Endurance Distance Walked  1005    Endurance additional comments  Unclear if pt understands BORG and RPE scales. No assistive device utilized         TREATMENT  Ther-ex  Completed outcome measures with patient.  5TSTS: 19.5s Stair practice 4 times with combination of bilateral support on R rail, bilateral support on both rails, unilateral support on R rail, and no UE support. Pt performs combination of step-to and step-over-step pattern  6MWT: 1005' TUG: 11.8 s  Strength testing (R/L out of 5): Hip: Flexion: 4/4 Extension: 4/4 Abduction: 4/4 Adduction: 4/4 IR: 4+/4+ ER: 4+/4+  Knee: Extension: 5/5 Flexion: 4/4  Ankle: Dorsiflexion: 4+/4+  Neuromuscular Re-education  Airex NBOS eyes open/closed x 30 seconds each; Airex NBOS eyes open horizontal and vertical head turns x 30 seconds; Airex toe taps to 6" step alternating LE x 10; Airex with horizontal ball passes each direction with trunk rotation following with head and eyes;   Cues for proper technique of exercises, slow eccentric contractions to target specific muscles and facility increased muscle building. Therapeutic rest breaks for energy  conservation   Patient's condition has the potential to improve in response to therapy. Maximum improvement is yet to be obtained. The anticipated improvement is attainable and reasonable in a generally predictable time. Start date of reporting period 06/26/17 end date of reporting period 08/21/17.               PT Education - 08/21/17 1020    Education provided  Yes    Education Details  HEP and plan of care, goals updated    Person(s) Educated  Patient    Methods  Explanation    Comprehension  Verbalized understanding       PT Short Term Goals - 08/21/17 1021      PT SHORT TERM GOAL #1   Title  Patient will be independent in home exercise program to improve strength/mobility for better functional independence with ADLs.    Baseline  Patient is doing well with her exercises    Time  4    Period  Weeks    Status  Achieved      PT SHORT TERM GOAL #2   Title  Patient (> 66 years old) will complete five times sit to stand test in < 15 seconds indicating an increased LE strength and improved balance.    Baseline  22.19 sec 07/31/17; 08/21/17: 19.5s    Time  4    Period  Weeks    Status  On-going    Target Date  09/18/17        PT Long Term Goals - 08/21/17 1022      PT LONG TERM GOAL #1   Title  Patient will increase six minute walk test distance to >1000 for progression to community ambulator and improve gait ability    Baseline  07/31/17 1085 feet; 08/21/17: 1005'    Time  8    Period  Weeks    Status  Achieved      PT LONG TERM GOAL #2   Title  Patient will increase BLE gross strength to 4+/5 as to improve functional strength for independent gait, increased standing tolerance and increased ADL ability.    Baseline  3+/5 BLE hips, 08/21/17: hip flex/ext/abd/add 4/5, hip  IR/ER 4+/5, Knee ext: 5/5, Knee flex: 4/4,     Time  8    Period  Weeks    Status  Partially Met    Target Date  10/16/17      PT LONG TERM GOAL #3   Title  Patient will ascend/descend 4 stairs  without rail assist independently without loss of balance to improve ability to get in/out of home.     Baseline  Definite need of railings and slow and guarded; 08/21/17: Need for rail, decreased speed, guarded    Time  8    Period  Weeks    Status  Partially Met    Target Date  10/16/17      PT LONG TERM GOAL #4   Title  Patient will reduce timed up and go to <11 seconds to reduce fall risk and demonstrate improved transfer/gait ability.    Baseline  19.87 sec 07/31/17; 08/21/17: 11.8 s    Time  8    Period  Weeks    Status  Partially Met    Target Date  10/16/17            Plan - 08/21/17 1238    Clinical Impression Statement  Pt making good progress with therapy on this date. Her Five Time Sit to Stand and TUG both improved since the last time they were tested. 6MWT is essentially unchanged and strength is improving. Dynamic and static balance interventions continued today. Pt demonstrates good motivation during session. Pt was encouraged to continue her HEP during the week in order to continue progressing balance and strength interventions. Pt will continue to benefit from skilled therapy services in order to further address LE strength deficits and balance deficits to decrease fall risk and improve mobility.    Rehab Potential  Good    PT Frequency  2x / week    PT Duration  8 weeks    PT Treatment/Interventions  Gait training;Therapeutic exercise;Therapeutic activities;Stair training;Balance training;Neuromuscular re-education;Patient/family education;Manual techniques;Aquatic Therapy;Moist Heat;Electrical Stimulation    PT Next Visit Plan  balance and therapeutic exercise for hip weakness    Consulted and Agree with Plan of Care  Patient;Family member/caregiver       Patient will benefit from skilled therapeutic intervention in order to improve the following deficits and impairments:  Abnormal gait, Decreased balance, Decreased endurance, Decreased mobility, Difficulty walking,  Decreased knowledge of precautions, Decreased activity tolerance, Decreased coordination, Decreased safety awareness, Decreased strength, Impaired flexibility, Postural dysfunction  Visit Diagnosis: Muscle weakness (generalized)  Unsteadiness on feet     Problem List There are no active problems to display for this patient.  Phillips Grout PT, DPT   Yidel Teuscher 08/21/2017, 1:40 PM  [place] MAIN [facility] SERVICES [address] [place], [place], [address] Phone: [phone]   Fax:  [phone]  Name: LOLETTA HARPER MRN: [id number] Date of Birth: 04-Sep-1950

## 2017-08-21 NOTE — Therapy (Signed)
[facility] MAIN [facility] SERVICES [address] [place], [place], [address] Phone: [phone]   Fax:  [phone]  Occupational Therapy Treatment  Patient Details  Name: Haley Lucas MRN: [id number] Date of Birth: Aug 21, 1950 Referring Provider: Marshell Garfinkel    Encounter Date: 08/21/2017  OT End of Session - 08/21/17 1009    Visit Number  11    Number of Visits  24    Date for OT Re-Evaluation  09/18/17    Authorization Type  visit 1 of 10 for progress report period starting 08/21/2017    OT Start Time  0930    OT Stop Time  1015    OT Time Calculation (min)  45 min    Activity Tolerance  Patient tolerated treatment well    Behavior During Therapy  [facility] for tasks assessed/performed       Past Medical History:  Diagnosis Date  . High cholesterol   . Hypertension   . Thyroid disease     No past surgical history on file.  There were no vitals filed for this visit.   OT TREATMENT   Therapeutic Exercise:  Pt. worked on AROM in all joint ranges of the LUE in sitting, for scapular elevation, depression, abduction, rotation, retraction, internal, and external rotation. AROM with the left shoulder in supine for shoulder flexion, abduction, and protraction. Pt. Worked on AAROM at the wall, and using the ladder with bilateral UEs.  Pt.worked on reaching with the left UE using the vertical clips.                                    OT Long Term Goals - 08/13/17 1658      OT LONG TERM GOAL #1   Title  Pt. will increase UE strength by 2 mm grades to assist with ADLs, and IADL    Baseline  Eval: Limited BUE strength UE shoulder strengthening not initiated secondary tobilateral shoulder pain.    Time  12    Period  Weeks    Status  On-going    Target Date  09/18/17      OT LONG TERM GOAL #2   Title  Pt. will improve right hand [facility] skills by 3 sec. to be able to manipulate ADL items.    Baseline  Eval: Right: 47  sec.    Time  12    Period  Weeks    Status  On-going    Target Date  09/18/17      OT LONG TERM GOAL #3   Title  Pt. will demonstrate visual compensatory strategies 100% of the time during ADLs, and IADLs.    Baseline  EVal: Pt. unable    Time  12    Period  Weeks    Status  On-going    Target Date  09/18/17      OT LONG TERM GOAL #4   Title  Pt. will demonstrate cognitive compensatory strategies 100% of the time during ADLs, and IADLs.    Baseline  Eval: Pt. is unable    Time  12    Period  Weeks    Status  On-going    Target Date  09/18/17      OT LONG TERM GOAL #5   Title  Pt. will complete IADL, home management tasks with Supervision.     Baseline  Eval: Dependent  Time  12    Period  Weeks    Status  On-going    Target Date  09/18/17            Plan - 08/21/17 1013    Clinical Impression Statement  Pt. conitnues to present with limited LUE shoulder ROM. Pt. reports that her left shoulder feels better after working in therapy, however continues to have pain always at home. Pt. reports more pain initially when spleeping. Pt. has pain in bilateral shoulders 5/10 in right shoulder, 7/10 pain in the left shoulder. Pt. has an MD appointment next Tuesday to address her shoulder discomfort.    Occupational Profile and client history currently impacting functional performance  Pt. 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    Occupational performance deficits (Please refer to evaluation for details):  ADL's;IADL's    Rehab Potential  Good    Current Impairments/barriers affecting progress:  Positive indicators: age, family support, motivation, Negative indicators: multiple comorbidities.    OT Frequency  2x / week    OT Duration  12 weeks    OT Treatment/Interventions  Self-care/ADL training;Neuromuscular education;Therapeutic activities;Cognitive remediation/compensation;Passive range of motion;DME and/or AE instruction;Patient/family  education;Energy conservation;Therapeutic exercise;Manual Therapy    Clinical Decision Making  Several treatment options, min-mod task modification necessary    Consulted and Agree with Plan of Care  Patient       Patient will benefit from skilled therapeutic intervention in order to improve the following deficits and impairments:  Pain, Impaired UE functional use, Decreased knowledge of precautions, Decreased cognition, Impaired tone, Decreased strength, Decreased endurance, Decreased activity tolerance, Decreased knowledge of use of DME, Decreased balance  Visit Diagnosis: Muscle weakness (generalized)    Problem List There are no active problems to display for this patient.   [name], MS, OTR/L 08/21/2017, 10:32 AM  [place] [facility] MAIN [facility] SERVICES [address] [place], [place], [address] Phone: [phone]   Fax:  [phone]  Name: JERMEKA SCHLOTTERBECK MRN: [id number] Date of Birth: 1950/05/03

## 2017-08-26 ENCOUNTER — Encounter: Payer: Self-pay | Admitting: Occupational Therapy

## 2017-08-26 ENCOUNTER — Ambulatory Visit: Payer: Medicare Other | Admitting: Occupational Therapy

## 2017-08-26 ENCOUNTER — Encounter: Payer: Self-pay | Admitting: Physical Therapy

## 2017-08-26 ENCOUNTER — Ambulatory Visit: Payer: Medicare Other | Admitting: Physical Therapy

## 2017-08-26 DIAGNOSIS — M6281 Muscle weakness (generalized): Secondary | ICD-10-CM

## 2017-08-26 DIAGNOSIS — H543 Unqualified visual loss, both eyes: Secondary | ICD-10-CM

## 2017-08-26 DIAGNOSIS — R278 Other lack of coordination: Secondary | ICD-10-CM

## 2017-08-26 DIAGNOSIS — R2681 Unsteadiness on feet: Secondary | ICD-10-CM

## 2017-08-26 DIAGNOSIS — R262 Difficulty in walking, not elsewhere classified: Secondary | ICD-10-CM

## 2017-08-26 NOTE — Therapy (Signed)
[facility] MAIN [facility] SERVICES [address] [place], [place], [address] Phone: [phone]   Fax:  [phone]  Occupational Therapy Treatment  Patient Details  Name: Haley Lucas MRN: [id number] Date of Birth: 06-19-1950 Referring Provider: Marshell Garfinkel    Encounter Date: 08/26/2017  OT End of Session - 08/26/17 1250    Visit Number  12    Number of Visits  24    Date for OT Re-Evaluation  09/18/17    Authorization Type  visit 2 of 10 for progress report period starting 08/21/2017    OT Start Time  1145    OT Stop Time  1225    OT Time Calculation (min)  40 min    Activity Tolerance  Patient tolerated treatment well    Behavior During Therapy  [facility] for tasks assessed/performed       Past Medical History:  Diagnosis Date  . High cholesterol   . Hypertension   . Thyroid disease     History reviewed. No pertinent surgical history.  There were no vitals filed for this visit.  Subjective Assessment - 08/26/17 1249    Subjective   Pt. has an MD appointment tomorrow    Pertinent History  Pt is a 67 y.o. 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 Pt. received inpatient rehabilitation services followed by home health services. Pt. had surgery to repair a right drooping eyelid on February 19th., 2019. Pt. is ready for outpatient OT services.    Patient Stated Goals  OT services    Currently in Pain?  Yes    Pain Score  8     Pain Location  Shoulder    Pain Orientation  Right;Left    Pain Descriptors / Indicators  Aching    Pain Type  Chronic pain    Pain Onset  More than a month ago    Pain Onset  More than a month ago      OT TREATMENT    Therapeutic Exercise:  Pt.worked on AROM in all joint ranges of the LUEin sitting, for scapular elevation, depression, abduction, rotation, retraction, internal, and external rotation.AROM with the left shoulder in supine for shoulder flexion, abduction, and protraction.  Pt. Worked on hand to face, and hand to head movement patterns in preparation for ADL tasks. Pt. Worked on AAROM at the wall, and using the ladder with the LUE.  Pt.worked on reaching with the left UE using the vertical clips in variuos elevated planes using lateral, and 3pt. Pinch.                               OT Education - 08/26/17 1250    Education provided  Yes    Education Details  UE functioning    Person(s) Educated  Patient    Methods  Explanation;Demonstration    Comprehension  Verbalized understanding          OT Long Term Goals - 08/13/17 1658      OT LONG TERM GOAL #1   Title  Pt. will increase UE strength by 2 mm grades to assist with ADLs, and IADL    Baseline  Eval: Limited BUE strength UE shoulder strengthening not initiated secondary tobilateral shoulder pain.    Time  12    Period  Weeks    Status  On-going    Target Date  09/18/17  OT LONG TERM GOAL #2   Title  Pt. will improve right hand [facility] skills by 3 sec. to be able to manipulate ADL items.    Baseline  Eval: Right: 47 sec.    Time  12    Period  Weeks    Status  On-going    Target Date  09/18/17      OT LONG TERM GOAL #3   Title  Pt. will demonstrate visual compensatory strategies 100% of the time during ADLs, and IADLs.    Baseline  EVal: Pt. unable    Time  12    Period  Weeks    Status  On-going    Target Date  09/18/17      OT LONG TERM GOAL #4   Title  Pt. will demonstrate cognitive compensatory strategies 100% of the time during ADLs, and IADLs.    Baseline  Eval: Pt. is unable    Time  12    Period  Weeks    Status  On-going    Target Date  09/18/17      OT LONG TERM GOAL #5   Title  Pt. will complete IADL, home management tasks with Supervision.     Baseline  Eval: Dependent    Time  12    Period  Weeks    Status  On-going    Target Date  09/18/17            Plan - 08/26/17 1251    Clinical Impression Statement  Pt. conitnues to  present with aching, and sorenes sin her bilateral shoulders. Pt. reports that the left shoulder is worse than her right. Pt. reports pain is worse at night time, and affects sleep. Pt. presents with limited LUE ROM, strength, and coordination. Pt. is responding well to the ROM. Pt. continues to work on improving ADL, and IADL functioning for improved functional use, and functional reaching during ADLs, and IADLs.     Occupational Profile and client history currently impacting functional performance  Pt. 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    Occupational performance deficits (Please refer to evaluation for details):  ADL's;IADL's    Rehab Potential  Good    Current Impairments/barriers affecting progress:  Positive indicators: age, family support, motivation, Negative indicators: multiple comorbidities.    OT Frequency  2x / week    OT Duration  12 weeks    OT Treatment/Interventions  Self-care/ADL training;Neuromuscular education;Therapeutic activities;Cognitive remediation/compensation;Passive range of motion;DME and/or AE instruction;Patient/family education;Energy conservation;Therapeutic exercise;Manual Therapy    Clinical Decision Making  Several treatment options, min-mod task modification necessary    Consulted and Agree with Plan of Care  Patient       Patient will benefit from skilled therapeutic intervention in order to improve the following deficits and impairments:  Pain, Impaired UE functional use, Decreased knowledge of precautions, Decreased cognition, Impaired tone, Decreased strength, Decreased endurance, Decreased activity tolerance, Decreased knowledge of use of DME, Decreased balance  Visit Diagnosis: Muscle weakness (generalized)  Other lack of coordination    Problem List There are no active problems to display for this patient.   [name], MS, OTR/L 08/26/2017, 12:58 PM  [facility] MAIN  [facility] SERVICES [address] [place], [place], [address] Phone: [phone]   Fax:  [phone]  Name: Haley Lucas MRN: [id number] Date of Birth: 07-Jun-1950

## 2017-08-26 NOTE — Therapy (Signed)
[place] MAIN [facility] SERVICES [address] [place], [place], [address] Phone: [phone]   Fax:  [phone]  Physical Therapy Treatment  Patient Details  Name: Haley Lucas MRN: [id number] Date of Birth: May 22, 1950 Referring Provider: [name]    Encounter Date: 08/26/2017  PT End of Session - 08/26/17 1051    Visit Number  13    Number of Visits  33    Date for PT Re-Evaluation  10/16/17    PT Start Time  1100    PT Stop Time  1145    PT Time Calculation (min)  45 min    Equipment Utilized During Treatment  Gait belt    Activity Tolerance  Patient tolerated treatment well    Behavior During Therapy  [facility] for tasks assessed/performed       Past Medical History:  Diagnosis Date  . High cholesterol   . Hypertension   . Thyroid disease     History reviewed. No pertinent surgical history.  There were no vitals filed for this visit.  Subjective Assessment - 08/26/17 1050    Subjective  Pt states she is doing well today. She reports pain continues in both shoulders today, left is worse than her right. Pt denies dizziness. No recent changes in health. No specific questions or concerns at this time.     Pertinent History  She had a hughes flap to her eye right eye feb 18th at [facility]. She was in the hospital for a month from the first surgey and was discharged Dec 11th. She had PT and OT for  2 weeks and then HHPT PT, OT and ST.  Tha ended christmas. She was not using a Assistive devie prior to surgery. She has weakness in her arms and she is not able to walk her normal distances.     Limitations  Standing    How long can you stand comfortably?  10 mins    Patient Stated Goals  to be able to walk and stand for longer periods of time, and improve balance.     Pain Onset  More than a month ago    Pain Onset  More than a month ago        Neuromuscular training:   Tandem stand with head turns x 2 minutes, CGA and cues for  posture Side stepping with head control, CGA Stepping onto AIREX, then on to 4 inch step followed by stepping down onto AIREX and then level surface in //bars x15.  Pt required occasional UE assist, and performance improved with each repetition   Stepping over and back x10 bilaterally  ; CGA Side step and back x10 bilaterally; CGA Side stepping on blue foam beam x 5, CGA Stepping over orange hurdle side ways and fwd x 10 each without UE support    Therapeutic Exercise:  Mini squats with RTB around knees to prevent valgus 2 x 10; RTB side stepping in // bars 4 lengths x 2; Sit to stand without UE support RTB around knees to prevent valgus 2 x 10; Side stepping with RTB x 4 lengths Squats x 10 x 2 Patient uses UE to stabilize on her LE'S during dynamic balance training    CGA and Min to mod verbal cues used throughout with increased in postural sway and LOB most seen with narrow base of support and while on uneven surfaces. Continues to have balance deficits typical with diagnosis. Patient performs intermediate level exercises without  pain behaviors and needs verbal cuing for postural alignment and head positioning               PT Education - 08/26/17 1051    Education provided  Yes    Education Details  HEP    Person(s) Educated  Patient    Methods  Explanation;Demonstration    Comprehension  Verbalized understanding;Returned demonstration       PT Short Term Goals - 08/21/17 1021      PT SHORT TERM GOAL #1   Title  Patient will be independent in home exercise program to improve strength/mobility for better functional independence with ADLs.    Baseline  Patient is doing well with her exercises    Time  4    Period  Weeks    Status  Achieved      PT SHORT TERM GOAL #2   Title  Patient (> 54 years old) will complete five times sit to stand test in < 15 seconds indicating an increased LE strength and improved balance.    Baseline  22.19 sec 07/31/17; 08/21/17: 19.5s     Time  4    Period  Weeks    Status  On-going    Target Date  09/18/17        PT Long Term Goals - 08/21/17 1022      PT LONG TERM GOAL #1   Title  Patient will increase six minute walk test distance to >1000 for progression to community ambulator and improve gait ability    Baseline  07/31/17 1085 feet; 08/21/17: 1005'    Time  8    Period  Weeks    Status  Achieved      PT LONG TERM GOAL #2   Title  Patient will increase BLE gross strength to 4+/5 as to improve functional strength for independent gait, increased standing tolerance and increased ADL ability.    Baseline  3+/5 BLE hips, 08/21/17: hip flex/ext/abd/add 4/5, hip IR/ER 4+/5, Knee ext: 5/5, Knee flex: 4/4,     Time  8    Period  Weeks    Status  Partially Met    Target Date  10/16/17      PT LONG TERM GOAL #3   Title  Patient will ascend/descend 4 stairs without rail assist independently without loss of balance to improve ability to get in/out of home.     Baseline  Definite need of railings and slow and guarded; 08/21/17: Need for rail, decreased speed, guarded    Time  8    Period  Weeks    Status  Partially Met    Target Date  10/16/17      PT LONG TERM GOAL #4   Title  Patient will reduce timed up and go to <11 seconds to reduce fall risk and demonstrate improved transfer/gait ability.    Baseline  19.87 sec 07/31/17; 08/21/17: 11.8 s    Time  8    Period  Weeks    Status  Partially Met    Target Date  10/16/17            Plan - 08/26/17 1052    Clinical Impression Statement  breaks in between advanced exercise. Patient instructed in advanced strengthening with increased repetition/resistance. Patient requires CGA for advanced balance exercise especially with less rail assist. Patient would benefit from additional skilled PT intervention to improve balance/gait safety and reduce fall risk.    Rehab Potential  Good  PT Frequency  2x / week    PT Duration  8 weeks    PT Treatment/Interventions  Gait  training;Therapeutic exercise;Therapeutic activities;Stair training;Balance training;Neuromuscular re-education;Patient/family education;Manual techniques;Aquatic Therapy;Moist Heat;Electrical Stimulation    PT Next Visit Plan  balance and therapeutic exercise for hip weakness    Consulted and Agree with Plan of Care  Patient;Family member/caregiver       Patient will benefit from skilled therapeutic intervention in order to improve the following deficits and impairments:  Abnormal gait, Decreased balance, Decreased endurance, Decreased mobility, Difficulty walking, Decreased knowledge of precautions, Decreased activity tolerance, Decreased coordination, Decreased safety awareness, Decreased strength, Impaired flexibility, Postural dysfunction  Visit Diagnosis: Muscle weakness (generalized)  Unsteadiness on feet  Other lack of coordination  Low vision, both eyes  Difficulty in walking, not elsewhere classified     Problem List There are no active problems to display for this patient.   [address], [place] DPT 08/26/2017, 10:54 AM  [place] MAIN [facility] SERVICES [address] [place], [place], [address] Phone: [phone]   Fax:  [phone]  Name: Haley Lucas MRN: [id number] Date of Birth: 11/01/1950

## 2017-08-28 ENCOUNTER — Encounter: Payer: Self-pay | Admitting: Physical Therapy

## 2017-08-28 ENCOUNTER — Ambulatory Visit: Payer: Medicare Other | Admitting: Physical Therapy

## 2017-08-28 DIAGNOSIS — M6281 Muscle weakness (generalized): Secondary | ICD-10-CM | POA: Diagnosis not present

## 2017-08-28 DIAGNOSIS — R2681 Unsteadiness on feet: Secondary | ICD-10-CM

## 2017-08-28 DIAGNOSIS — R278 Other lack of coordination: Secondary | ICD-10-CM

## 2017-08-28 DIAGNOSIS — R262 Difficulty in walking, not elsewhere classified: Secondary | ICD-10-CM

## 2017-08-28 DIAGNOSIS — H543 Unqualified visual loss, both eyes: Secondary | ICD-10-CM

## 2017-08-28 NOTE — Therapy (Signed)
[place] MAIN [facility] SERVICES [address] [place], [place], [address] Phone: [phone]   Fax:  [phone]  Physical Therapy Treatment  Patient Details  Name: Haley Lucas MRN: [id number] Date of Birth: [birth year] Referring Provider: [name]    Encounter Date: 08/28/2017  PT End of Session - 08/28/17 1118    Visit Number  14    Number of Visits  33    Date for PT Re-Evaluation  10/16/17    PT Start Time  1100    PT Stop Time  1145    PT Time Calculation (min)  45 min    Equipment Utilized During Treatment  Gait belt    Activity Tolerance  Patient tolerated treatment well    Behavior During Therapy  [facility] for tasks assessed/performed       Past Medical History:  Diagnosis Date  . High cholesterol   . Hypertension   . Thyroid disease     History reviewed. No pertinent surgical history.  There were no vitals filed for this visit.  Subjective Assessment - 08/28/17 1056    Subjective  Pt states she is doing well today. She reports pain continues in both shoulders today, left is worse than her right. Pt denies dizziness. No recent changes in health. No specific questions or concerns at this time.     Pertinent History  She had a hughes flap to her eye right eye feb 18th at [facility]. She was in the hospital for a month from the first surgey and was discharged Dec 11th. She had PT and OT for  2 weeks and then HHPT PT, OT and ST.  Tha ended christmas. She was not using a Assistive devie prior to surgery. She has weakness in her arms and she is not able to walk her normal distances.     Limitations  Standing    How long can you stand comfortably?  10 mins    Patient Stated Goals  to be able to walk and stand for longer periods of time, and improve balance.     Currently in Pain?  Yes    Pain Score  4     Pain Location  Shoulder    Pain Orientation  Right;Left    Pain Descriptors / Indicators  Aching    Pain Onset  More than a month  ago    Pain Frequency  Constant    Aggravating Factors   sidelying    Pain Relieving Factors  none    Effect of Pain on Daily Activities  difficult to perform household chores    Pain Onset  More than a month ago      Treatment   Modified tandem stance eyes open with horizontal and vertical head turns alternating; cues for posture x 1 mins Tandem stand with head turns x 2 minutes, cues for posture Side stepping with cues to not rotate hips and for correct head position x 4 laps  Stepping onto AIREX, then on to 4 inch step followed by stepping down onto AIREX and then level surface in //bars x15.  Pt required occasional UE assist, and performance improved with each repetition   Stepping over and back  Hurdle x10 bilaterally  with cues for posture and correct technique Side Stepping over and back hurdle x`10 bilaterally with verbal cueing to perform exercise as fast as possible Stepping over and back  Hurdle with foam x10 bilaterally  with cues for posture and  correct technique Side Stepping over and back hurdle  with foam x`10 bilaterally with verbal cueing to perform exercise as fast as possible Side step and back to 6 inch stool x10 bilaterally fwd and bil side stepping over 1/2 foam roll with no UE support, 1x15 each, CGA for balance, min cues Therapeutic exercise:  Resisted side-steeping RTB 2 lengths x 2; Standing mini squats 2 x 10  Step-ups to 6" step x 10 x 2 bilateral; Quantum leg press 90 # x 10, 100# x 10;    CGA and Min  verbal cues used throughout with increased in postural sway and LOB most seen with narrow base of support and while on uneven surfaces. Continues to have balance deficits typical with diagnosis. Patient performs intermediate level exercises without pain behaviors and needs verbal cuing for postural alignment and head positioning and correct technique.                 PT Education - 08/28/17 1117    Education provided  Yes    Education Details   HEP    Person(s) Educated  Patient    Methods  Explanation    Comprehension  Verbalized understanding       PT Short Term Goals - 08/21/17 1021      PT SHORT TERM GOAL #1   Title  Patient will be independent in home exercise program to improve strength/mobility for better functional independence with ADLs.    Baseline  Patient is doing well with her exercises    Time  4    Period  Weeks    Status  Achieved      PT SHORT TERM GOAL #2   Title  Patient (> 47 years old) will complete five times sit to stand test in < 15 seconds indicating an increased LE strength and improved balance.    Baseline  22.19 sec 07/31/17; 08/21/17: 19.5s    Time  4    Period  Weeks    Status  On-going    Target Date  09/18/17        PT Long Term Goals - 08/21/17 1022      PT LONG TERM GOAL #1   Title  Patient will increase six minute walk test distance to >1000 for progression to community ambulator and improve gait ability    Baseline  07/31/17 1085 feet; 08/21/17: 1005'    Time  8    Period  Weeks    Status  Achieved      PT LONG TERM GOAL #2   Title  Patient will increase BLE gross strength to 4+/5 as to improve functional strength for independent gait, increased standing tolerance and increased ADL ability.    Baseline  3+/5 BLE hips, 08/21/17: hip flex/ext/abd/add 4/5, hip IR/ER 4+/5, Knee ext: 5/5, Knee flex: 4/4,     Time  8    Period  Weeks    Status  Partially Met    Target Date  10/16/17      PT LONG TERM GOAL #3   Title  Patient will ascend/descend 4 stairs without rail assist independently without loss of balance to improve ability to get in/out of home.     Baseline  Definite need of railings and slow and guarded; 08/21/17: Need for rail, decreased speed, guarded    Time  8    Period  Weeks    Status  Partially Met    Target Date  10/16/17      PT  LONG TERM GOAL #4   Title  Patient will reduce timed up and go to <11 seconds to reduce fall risk and demonstrate improved transfer/gait  ability.    Baseline  19.87 sec 07/31/17; 08/21/17: 11.8 s    Time  8    Period  Weeks    Status  Partially Met    Target Date  10/16/17            Plan - 08/28/17 1120    Clinical Impression Statement  Patient instructed in intermediate strengthening and balance exercise.  Patient requires min Vcs for correct exercise technique including to improve LE  control with standing exercise. Patient demonstrates better quad control with SLS tasks with rail assist. Patient would benefit from additional skilled PT intervention to improve balance/gait safety and reduce fall risk.    Rehab Potential  Good    PT Frequency  2x / week    PT Duration  8 weeks    PT Treatment/Interventions  Gait training;Therapeutic exercise;Therapeutic activities;Stair training;Balance training;Neuromuscular re-education;Patient/family education;Manual techniques;Aquatic Therapy;Moist Heat;Electrical Stimulation    PT Next Visit Plan  balance and therapeutic exercise for hip weakness    Consulted and Agree with Plan of Care  Patient;Family member/caregiver       Patient will benefit from skilled therapeutic intervention in order to improve the following deficits and impairments:  Abnormal gait, Decreased balance, Decreased endurance, Decreased mobility, Difficulty walking, Decreased knowledge of precautions, Decreased activity tolerance, Decreased coordination, Decreased safety awareness, Decreased strength, Impaired flexibility, Postural dysfunction  Visit Diagnosis: Muscle weakness (generalized)  Unsteadiness on feet  Other lack of coordination  Low vision, both eyes  Difficulty in walking, not elsewhere classified     Problem List There are no active problems to display for this patient.   [address], [place] DPT 08/28/2017, 11:22 AM  [place] MAIN [facility] SERVICES [address] [place], [place], [address] Phone: [phone]   Fax:  [phone]  Name:  Haley Lucas MRN: [id number] Date of Birth: [birth year]

## 2017-09-02 ENCOUNTER — Ambulatory Visit: Payer: Medicare Other | Admitting: Physical Therapy

## 2017-09-02 ENCOUNTER — Encounter: Payer: Self-pay | Admitting: Occupational Therapy

## 2017-09-02 ENCOUNTER — Ambulatory Visit: Payer: Medicare Other | Admitting: Occupational Therapy

## 2017-09-02 ENCOUNTER — Encounter: Payer: Self-pay | Admitting: Physical Therapy

## 2017-09-02 DIAGNOSIS — M6281 Muscle weakness (generalized): Secondary | ICD-10-CM | POA: Diagnosis not present

## 2017-09-02 DIAGNOSIS — H543 Unqualified visual loss, both eyes: Secondary | ICD-10-CM

## 2017-09-02 DIAGNOSIS — R2681 Unsteadiness on feet: Secondary | ICD-10-CM

## 2017-09-02 DIAGNOSIS — R262 Difficulty in walking, not elsewhere classified: Secondary | ICD-10-CM

## 2017-09-02 DIAGNOSIS — R278 Other lack of coordination: Secondary | ICD-10-CM

## 2017-09-02 NOTE — Therapy (Signed)
[place] MAIN [facility] SERVICES [address] [place], [place], [address] Phone: [phone]   Fax:  [phone]  Physical Therapy Treatment  Patient Details  Name: Haley Lucas MRN: [id number] Date of Birth: Nov 22, 1950 Referring Provider: [name]    Encounter Date: 09/02/2017  PT End of Session - 09/02/17 1057    Visit Number  15    Number of Visits  33    Date for PT Re-Evaluation  10/16/17    PT Start Time  1100    PT Stop Time  1145    PT Time Calculation (min)  45 min    Equipment Utilized During Treatment  Gait belt    Activity Tolerance  Patient tolerated treatment well    Behavior During Therapy  [facility] for tasks assessed/performed       Past Medical History:  Diagnosis Date  . High cholesterol   . Hypertension   . Thyroid disease     History reviewed. No pertinent surgical history.  There were no vitals filed for this visit.       NEUROMUSCULAR RE-EDUCATION Fwd/retro tandem walk on long airex with no UE support x 10 laps; mod verbal cues to decrease UE support, mod assist Bil side stepping on long airex with no UE support x 5 laps; increased difficulty with side stepping R, CGA for steadiness  `/   BLE staggered stance anterior/posterior weight shifting on small rockerboard;  CGA, increased difficulty and more unsteadiness with RLE fwd; min cues to increase weight shift over RLE  BLE NBOS stance on airex pad with no UE and eyes closed, 5x15 sec  BLE staggered stance with front foot on dynadisc and balloon taps against mirror x 5 min; CGA, increased hip flexion to maintain balance, more difficulty with LLE fwd    Matrix machine and fwd/bwd walking with 12.5 lbs and CGA, x 5 reps Matrix machine with side stepping left and right with 12.5 and min assist, x 5 reps    2 disk in stepping pattern; head turns left and right and cues to not use UE support  Step up to 6 inch stool from purple foam pad x 20 , CGA  Tapping  from purple pad to target for single leg balance training with cues not to use UE support and for correct posture, CGA  Therapeutic exercise:  Leg press with 90#x 20 x 3, cues to not snap her knees and control them with extension  Mini squats in //bars with UE support and CGA cues to keep shoulders back  Heel raises on foam x 20 , cues for correct technique  Min cueing needed to appropriately perform tasks with leg, hand, and head position. Decreased coordination demonstrated requiring consistent verbal cueing to correct form.. Patient continues to demonstrate some in coordination of movement with select exercises and responds well to verbal and tactile cues for correction           PT Education - 09/02/17 1057    Education provided  Yes    Education Details  safety with balance    Person(s) Educated  Patient    Methods  Explanation;Demonstration;Tactile cues    Comprehension  Verbalized understanding;Returned demonstration       PT Short Term Goals - 08/21/17 1021      PT SHORT TERM GOAL #1   Title  Patient will be independent in home exercise program to improve strength/mobility for better functional independence with ADLs.    Baseline  Patient is doing well with her exercises    Time  4    Period  Weeks    Status  Achieved      PT SHORT TERM GOAL #2   Title  Patient (> 56 years old) will complete five times sit to stand test in < 15 seconds indicating an increased LE strength and improved balance.    Baseline  22.19 sec 07/31/17; 08/21/17: 19.5s    Time  4    Period  Weeks    Status  On-going    Target Date  09/18/17        PT Long Term Goals - 08/21/17 1022      PT LONG TERM GOAL #1   Title  Patient will increase six minute walk test distance to >1000 for progression to community ambulator and improve gait ability    Baseline  07/31/17 1085 feet; 08/21/17: 1005'    Time  8    Period  Weeks    Status  Achieved      PT LONG TERM GOAL #2   Title  Patient will  increase BLE gross strength to 4+/5 as to improve functional strength for independent gait, increased standing tolerance and increased ADL ability.    Baseline  3+/5 BLE hips, 08/21/17: hip flex/ext/abd/add 4/5, hip IR/ER 4+/5, Knee ext: 5/5, Knee flex: 4/4,     Time  8    Period  Weeks    Status  Partially Met    Target Date  10/16/17      PT LONG TERM GOAL #3   Title  Patient will ascend/descend 4 stairs without rail assist independently without loss of balance to improve ability to get in/out of home.     Baseline  Definite need of railings and slow and guarded; 08/21/17: Need for rail, decreased speed, guarded    Time  8    Period  Weeks    Status  Partially Met    Target Date  10/16/17      PT LONG TERM GOAL #4   Title  Patient will reduce timed up and go to <11 seconds to reduce fall risk and demonstrate improved transfer/gait ability.    Baseline  19.87 sec 07/31/17; 08/21/17: 11.8 s    Time  8    Period  Weeks    Status  Partially Met    Target Date  10/16/17            Plan - 09/02/17 1058    Clinical Impression Statement  Patient instructed in intermediate strengthening and balance exercise.  Patient requires min Vcs for correct exercise technique including to improve LE  control with standing exercise. Patient demonstrates better quad control with SLS tasks with rail assist. Patient would benefit from additional skilled PT intervention to improve balance/gait safety and reduce fall risk.    Rehab Potential  Good    PT Frequency  2x / week    PT Duration  8 weeks    PT Treatment/Interventions  Gait training;Therapeutic exercise;Therapeutic activities;Stair training;Balance training;Neuromuscular re-education;Patient/family education;Manual techniques;Aquatic Therapy;Moist Heat;Electrical Stimulation    PT Next Visit Plan  balance and therapeutic exercise for hip weakness    Consulted and Agree with Plan of Care  Patient;Family member/caregiver       Patient will benefit  from skilled therapeutic intervention in order to improve the following deficits and impairments:  Abnormal gait, Decreased balance, Decreased endurance, Decreased mobility, Difficulty walking, Decreased knowledge of precautions, Decreased activity tolerance, Decreased coordination,  Decreased safety awareness, Decreased strength, Impaired flexibility, Postural dysfunction  Visit Diagnosis: Muscle weakness (generalized)  Unsteadiness on feet  Other lack of coordination  Low vision, both eyes  Difficulty in walking, not elsewhere classified     Problem List There are no active problems to display for this patient.   [address] [place] DPT 09/02/2017, 11:38 AM  [place] MAIN [facility] SERVICES [address] [place], [place], [address] Phone: [phone]   Fax:  [phone]  Name: Haley Lucas MRN: [id number] Date of Birth: [birth year]

## 2017-09-02 NOTE — Therapy (Signed)
[place] [facility] MAIN [facility] SERVICES [address] [place], [place], [address] Phone: [phone]   Fax:  [phone]  Occupational Therapy Treatment  Patient Details  Name: Haley Lucas MRN: [id number] Date of Birth: 1950-07-02 Referring Provider: Marshell Garfinkel    Encounter Date: 09/02/2017  OT End of Session - 09/02/17 1106    Visit Number  13    Number of Visits  24    Date for OT Re-Evaluation  09/18/17    Authorization Type  visit 3 of 10 for progress report period starting 08/21/2017    OT Start Time  1015    OT Stop Time  1100    OT Time Calculation (min)  45 min    Activity Tolerance  Patient tolerated treatment well    Behavior During Therapy  [facility] for tasks assessed/performed       Past Medical History:  Diagnosis Date  . High cholesterol   . Hypertension   . Thyroid disease     History reviewed. No pertinent surgical history.  There were no vitals filed for this visit.  Subjective Assessment - 09/02/17 1103    Subjective   Pt. reports doing well today.    Pertinent History  Pt is a 67 y.o. 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 Pt. received inpatient rehabilitation services followed by home health services. Pt. had surgery to repair a right drooping eyelid on February 19th., 2019. Pt. is ready for outpatient OT services.    Patient Stated Goals  OT services    Currently in Pain?  Yes    Pain Score  7     Pain Location  Shoulder    Pain Orientation  Left      OT TREATMENT    Therapeutic Exercise:  Pt.worked on AROM in all joint ranges of the LUEin sitting, for scapular elevation, depression, abduction, rotation, retraction, internal, and external rotation.AROM with the left shoulder in supine for shoulder flexion, abduction, and protraction. Shoulder flexion in supine: 124(134), sitting: 111(122).Pt.worked on reaching with the left UEusing the verticalclips in various elevated planes using  lateral, and 3pt. Pinch. Pt. worked on reaching with the LUE in flexion, and abduction using [place] discs. Pt. worked on shoulder flexion for AAROM with 1# weight.                           OT Education - 09/02/17 1104    Education provided  Yes    Education Details  LUE ROM, Recommended pt. not use green resistive therabands at home that pt. received from her inpatient hospitalization.    Person(s) Educated  Patient    Methods  Explanation;Demonstration;Tactile cues    Comprehension  Verbalized understanding;Returned demonstration          OT Long Term Goals - 08/13/17 1658      OT LONG TERM GOAL #1   Title  Pt. will increase UE strength by 2 mm grades to assist with ADLs, and IADL    Baseline  Eval: Limited BUE strength UE shoulder strengthening not initiated secondary tobilateral shoulder pain.    Time  12    Period  Weeks    Status  On-going    Target Date  09/18/17      OT LONG TERM GOAL #2   Title  Pt. will improve right hand [facility] skills by 3 sec. to be able to manipulate ADL items.  Baseline  Eval: Right: 47 sec.    Time  12    Period  Weeks    Status  On-going    Target Date  09/18/17      OT LONG TERM GOAL #3   Title  Pt. will demonstrate visual compensatory strategies 100% of the time during ADLs, and IADLs.    Baseline  EVal: Pt. unable    Time  12    Period  Weeks    Status  On-going    Target Date  09/18/17      OT LONG TERM GOAL #4   Title  Pt. will demonstrate cognitive compensatory strategies 100% of the time during ADLs, and IADLs.    Baseline  Eval: Pt. is unable    Time  12    Period  Weeks    Status  On-going    Target Date  09/18/17      OT LONG TERM GOAL #5   Title  Pt. will complete IADL, home management tasks with Supervision.     Baseline  Eval: Dependent    Time  12    Period  Weeks    Status  On-going    Target Date  09/18/17            Plan - 09/02/17 1106    Clinical Impression Statement Pt. is  planning to have an injection in her shoulder at her follow-up MD appointment. Pt. is responding well to LUE stretches, and ROM. Pt. reports that she always has reduced pain after therapy. Pt. continues to work on improving UE functioning for improved functional reaching during ADLs, and IADLs    Occupational Profile and client history currently impacting functional performance  Pt. 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    Occupational performance deficits (Please refer to evaluation for details):  ADL's;IADL's    Rehab Potential  Good    Current Impairments/barriers affecting progress:  Positive indicators: age, family support, motivation, Negative indicators: multiple comorbidities.    OT Frequency  2x / week    OT Duration  12 weeks    OT Treatment/Interventions  Self-care/ADL training;Neuromuscular education;Therapeutic activities;Cognitive remediation/compensation;Passive range of motion;DME and/or AE instruction;Patient/family education;Energy conservation;Therapeutic exercise;Manual Therapy    Clinical Decision Making  Several treatment options, min-mod task modification necessary    Consulted and Agree with Plan of Care  Patient       Patient will benefit from skilled therapeutic intervention in order to improve the following deficits and impairments:  Pain, Impaired UE functional use, Decreased knowledge of precautions, Decreased cognition, Impaired tone, Decreased strength, Decreased endurance, Decreased activity tolerance, Decreased knowledge of use of DME, Decreased balance  Visit Diagnosis: Muscle weakness (generalized)    Problem List There are no active problems to display for this patient.   Haley Messier, MS, OTR/L 09/02/2017, 11:16 AM  [place] [facility] MAIN [facility] SERVICES [address] [place], [place], [address] Phone: [phone]   Fax:  [phone]  Name: Haley Lucas MRN: [id number] Date of Birth:  1950-10-13

## 2017-09-04 ENCOUNTER — Encounter: Payer: Self-pay | Admitting: Occupational Therapy

## 2017-09-04 ENCOUNTER — Encounter: Payer: Self-pay | Admitting: Physical Therapy

## 2017-09-04 ENCOUNTER — Other Ambulatory Visit: Payer: Self-pay

## 2017-09-04 ENCOUNTER — Ambulatory Visit: Payer: Medicare Other | Admitting: Physical Therapy

## 2017-09-04 ENCOUNTER — Ambulatory Visit: Payer: Medicare Other | Admitting: Occupational Therapy

## 2017-09-04 DIAGNOSIS — R262 Difficulty in walking, not elsewhere classified: Secondary | ICD-10-CM

## 2017-09-04 DIAGNOSIS — R278 Other lack of coordination: Secondary | ICD-10-CM

## 2017-09-04 DIAGNOSIS — R2681 Unsteadiness on feet: Secondary | ICD-10-CM

## 2017-09-04 DIAGNOSIS — M6281 Muscle weakness (generalized): Secondary | ICD-10-CM | POA: Diagnosis not present

## 2017-09-04 DIAGNOSIS — H543 Unqualified visual loss, both eyes: Secondary | ICD-10-CM

## 2017-09-04 NOTE — Therapy (Signed)
[place] MAIN [facility] SERVICES [address] [place], [place], [address] Phone: [phone]   Fax:  [phone]  Physical Therapy Treatment  Patient Details  Name: Haley Lucas MRN: [id number] Date of Birth: 1951/01/13 Referring Provider: [name]    Encounter Date: 09/04/2017  PT End of Session - 09/04/17 1111    Visit Number  16    Number of Visits  33    Date for PT Re-Evaluation  10/16/17    PT Start Time  1100    PT Stop Time  1145    PT Time Calculation (min)  45 min    Equipment Utilized During Treatment  Gait belt    Activity Tolerance  Patient tolerated treatment well    Behavior During Therapy  [facility] for tasks assessed/performed       Past Medical History:  Diagnosis Date  . High cholesterol   . Hypertension   . Thyroid disease     History reviewed. No pertinent surgical history.  There were no vitals filed for this visit.  Subjective Assessment - 09/04/17 1110    Subjective  Pt states she is doing well today. She reports pain continues in both shoulders today, left is worse than her right. Pt denies dizziness. No recent changes in health. No specific questions or concerns at this time.     Pertinent History  She had a hughes flap to her eye right eye feb 18th at [facility]. She was in the hospital for a month from the first surgey and was discharged Dec 11th. She had PT and OT for  2 weeks and then HHPT PT, OT and ST.  Tha ended christmas. She was not using a Assistive devie prior to surgery. She has weakness in her arms and she is not able to walk her normal distances.     Limitations  Standing    How long can you stand comfortably?  10 mins    Patient Stated Goals  to be able to walk and stand for longer periods of time, and improve balance.     Currently in Pain?  Yes    Pain Score  8     Pain Location  Shoulder    Pain Orientation  Right;Left    Pain Descriptors / Indicators  Aching    Pain Onset  More than a month  ago    Pain Onset  More than a month ago       Therapeutic exercise and neuromuscular training:     Nustep L2 x3 min, L5 x3 minutes with SPM above 85 , Cues for appropriate SPM  B LE standing hip SLR, abd, ext 2x10 with GTB  Mini squats 2x10  Heel raises 2x10  1/2 foam flat side up and balance with head turns left and right feet apart and feet together,; cues for keeping balance and using ankle and hips to maintain center of gravity   tandem standing on 1/2 foam  ; cues to maintain balance and posture correction  side stepping left and right in parallel bars 10 feet x 3; rotation cues for upright posture and not rotating hips towards the direction of motion  step ups from floor to 6 inch stool from purple foam  x 20 bilateral; cues to try not to use UE and to keep correct head position  leg press 75 lbs x 20 x 3, cues not to snap knees during extension and to perform slowly for max strengthening  marching in parallel bars x 20; cues to raise up knees level with therapists hands  Tilt board fwd/bwd, side to side left and right; cues for posture correction  TM walking side stepping left and right x . 4 miles / hour; cues for posture correction  Standing on purple foam and tapping to stepping stones laterally and fwd x 20 BLE  Good stability demonstrated with narrow stance EC while UE support was needed frequently with tandem stance. Min assist needed for stair tapping demonstrating occasional instances of heel catching on descent                    PT Education - 09/04/17 1111    Education provided  Yes    Education Details  balance safety    Person(s) Educated  Patient    Methods  Explanation;Demonstration    Comprehension  Verbalized understanding;Returned demonstration       PT Short Term Goals - 08/21/17 1021      PT SHORT TERM GOAL #1   Title  Patient will be independent in home exercise program to improve strength/mobility for better functional  independence with ADLs.    Baseline  Patient is doing well with her exercises    Time  4    Period  Weeks    Status  Achieved      PT SHORT TERM GOAL #2   Title  Patient (> 46 years old) will complete five times sit to stand test in < 15 seconds indicating an increased LE strength and improved balance.    Baseline  22.19 sec 07/31/17; 08/21/17: 19.5s    Time  4    Period  Weeks    Status  On-going    Target Date  09/18/17        PT Long Term Goals - 08/21/17 1022      PT LONG TERM GOAL #1   Title  Patient will increase six minute walk test distance to >1000 for progression to community ambulator and improve gait ability    Baseline  07/31/17 1085 feet; 08/21/17: 1005'    Time  8    Period  Weeks    Status  Achieved      PT LONG TERM GOAL #2   Title  Patient will increase BLE gross strength to 4+/5 as to improve functional strength for independent gait, increased standing tolerance and increased ADL ability.    Baseline  3+/5 BLE hips, 08/21/17: hip flex/ext/abd/add 4/5, hip IR/ER 4+/5, Knee ext: 5/5, Knee flex: 4/4,     Time  8    Period  Weeks    Status  Partially Met    Target Date  10/16/17      PT LONG TERM GOAL #3   Title  Patient will ascend/descend 4 stairs without rail assist independently without loss of balance to improve ability to get in/out of home.     Baseline  Definite need of railings and slow and guarded; 08/21/17: Need for rail, decreased speed, guarded    Time  8    Period  Weeks    Status  Partially Met    Target Date  10/16/17      PT LONG TERM GOAL #4   Title  Patient will reduce timed up and go to <11 seconds to reduce fall risk and demonstrate improved transfer/gait ability.    Baseline  19.87 sec 07/31/17; 08/21/17: 11.8 s    Time  8    Period  Weeks    Status  Partially Met    Target Date  10/16/17            Plan - 09/04/17 1112    Clinical Impression Statement  Dynamic and static balance interventions continued today, with a focus on  unilateral LE stability.  Pt tolerated all exercises well.  Intermediate dynamic standing balance tasks were progressed with cga needed for difficulty with turning his head and rotating  trunk with weight shifting exercises. Patients LE strength exercises were progressed today with focus on unilateral strength and stability to focus on increasing LE strength and stability with daily tasks.  Pt would continue to benefit from skilled therapy services in order to further address LE strength deficits and balance deficits in order to decrease fall risk and improve mobility.    Rehab Potential  Good    PT Frequency  2x / week    PT Duration  8 weeks    PT Treatment/Interventions  Gait training;Therapeutic exercise;Therapeutic activities;Stair training;Balance training;Neuromuscular re-education;Patient/family education;Manual techniques;Aquatic Therapy;Moist Heat;Electrical Stimulation    PT Next Visit Plan  balance and therapeutic exercise for hip weakness    Consulted and Agree with Plan of Care  Patient;Family member/caregiver       Patient will benefit from skilled therapeutic intervention in order to improve the following deficits and impairments:  Abnormal gait, Decreased balance, Decreased endurance, Decreased mobility, Difficulty walking, Decreased knowledge of precautions, Decreased activity tolerance, Decreased coordination, Decreased safety awareness, Decreased strength, Impaired flexibility, Postural dysfunction  Visit Diagnosis: Muscle weakness (generalized)  Other lack of coordination  Unsteadiness on feet  Low vision, both eyes  Difficulty in walking, not elsewhere classified     Problem List There are no active problems to display for this patient.   [address], [place] DPT 09/04/2017, 11:16 AM  [place] MAIN [facility] SERVICES [address] [place], [place], [address] Phone: [phone]   Fax:  [phone]  Name: ANGELEIGH CHIASSON MRN: [id number] Date of Birth: Feb 08, 1951

## 2017-09-04 NOTE — Therapy (Signed)
[place] [facility] MAIN [facility] SERVICES [address] [place], [place], [address] Phone: [phone]   Fax:  [phone]  Occupational Therapy Treatment  Patient Details  Name: Haley Lucas MRN: [id number] Date of Birth: 1950/09/08 Referring Provider: Marshell Garfinkel    Encounter Date: 09/04/2017  OT End of Session - 09/04/17 1023    Visit Number  14    Number of Visits  24    Date for OT Re-Evaluation  09/18/17    Authorization Type  visit 4 of 10 for progress report period starting 08/21/2017    OT Start Time  1015    OT Stop Time  1100    OT Time Calculation (min)  45 min    Activity Tolerance  Patient tolerated treatment well    Behavior During Therapy  [facility] for tasks assessed/performed       Past Medical History:  Diagnosis Date  . High cholesterol   . Hypertension   . Thyroid disease     History reviewed. No pertinent surgical history.  There were no vitals filed for this visit.  Subjective Assessment - 09/04/17 1022    Subjective   Pt reports left shoulder is better than Monday.    Pertinent History  Pt is a 67 y.o. 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 Pt. received inpatient rehabilitation services followed by home health services. Pt. had surgery to repair a right drooping eyelid on February 19th., 2019. Pt. is ready for outpatient OT services.    Patient Stated Goals  OT services    Currently in Pain?  Yes    Pain Score  7     Pain Location  Shoulder    Pain Orientation  Left      OT TREATMENT    Therapeutic Exercise:  Pt.worked on AROM in all joint ranges of the LUEin sitting, for scapular elevation, depression, abduction, rotation, retraction, internal, and external rotation.AROM with the left shoulder in supine for shoulder flexion, abduction, and protraction. Pt. Worked on ladder climbs using the finger ladder. Pt. worked on shoulder flexion for AAROM with 1# weight in supine. Pt. Worked on the  SciFit for 8 min. With constant monitoring of the BUEs. Pt. Worked with no resistance.                            OT Education - 09/04/17 1022    Education provided  Yes    Education Details  LUE ROM    Person(s) Educated  Patient    Methods  Explanation;Demonstration;Tactile cues    Comprehension  Verbalized understanding;Returned demonstration          OT Long Term Goals - 08/13/17 1658      OT LONG TERM GOAL #1   Title  Pt. will increase UE strength by 2 mm grades to assist with ADLs, and IADL    Baseline  Eval: Limited BUE strength UE shoulder strengthening not initiated secondary tobilateral shoulder pain.    Time  12    Period  Weeks    Status  On-going    Target Date  09/18/17      OT LONG TERM GOAL #2   Title  Pt. will improve right hand [facility] skills by 3 sec. to be able to manipulate ADL items.    Baseline  Eval: Right: 47 sec.    Time  12    Period  Weeks  Status  On-going    Target Date  09/18/17      OT LONG TERM GOAL #3   Title  Pt. will demonstrate visual compensatory strategies 100% of the time during ADLs, and IADLs.    Baseline  EVal: Pt. unable    Time  12    Period  Weeks    Status  On-going    Target Date  09/18/17      OT LONG TERM GOAL #4   Title  Pt. will demonstrate cognitive compensatory strategies 100% of the time during ADLs, and IADLs.    Baseline  Eval: Pt. is unable    Time  12    Period  Weeks    Status  On-going    Target Date  09/18/17      OT LONG TERM GOAL #5   Title  Pt. will complete IADL, home management tasks with Supervision.     Baseline  Eval: Dependent    Time  12    Period  Weeks    Status  On-going    Target Date  09/18/17            Plan - 09/04/17 1023    Clinical Impression Statement  Pt. continues to present with limited LUE shoulder ROM. Pt. reports her left shoulder is better than it was this past Monday. Pt. continues to work on improving LUE functioning for improved  engagement is ADL, and IADL tasks.     Occupational Profile and client history currently impacting functional performance  Pt. 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    Occupational performance deficits (Please refer to evaluation for details):  ADL's;IADL's    Rehab Potential  Good    Current Impairments/barriers affecting progress:  Positive indicators: age, family support, motivation, Negative indicators: multiple comorbidities.    OT Frequency  2x / week    OT Duration  12 weeks    OT Treatment/Interventions  Self-care/ADL training;Neuromuscular education;Therapeutic activities;Cognitive remediation/compensation;Passive range of motion;DME and/or AE instruction;Patient/family education;Energy conservation;Therapeutic exercise;Manual Therapy    Clinical Decision Making  Several treatment options, min-mod task modification necessary    Consulted and Agree with Plan of Care  Patient       Patient will benefit from skilled therapeutic intervention in order to improve the following deficits and impairments:  Pain, Impaired UE functional use, Decreased knowledge of precautions, Decreased cognition, Impaired tone, Decreased strength, Decreased endurance, Decreased activity tolerance, Decreased knowledge of use of DME, Decreased balance  Visit Diagnosis: Muscle weakness (generalized)  Other lack of coordination    Problem List There are no active problems to display for this patient.   Haley Lucas 09/04/2017, 11:01 AM  [place] [facility] MAIN [facility] SERVICES [address] [place], [place], [address] Phone: [phone]   Fax:  [phone]  Name: Haley Lucas MRN: [id number] Date of Birth: 1950/12/07

## 2017-09-11 ENCOUNTER — Ambulatory Visit: Payer: Medicare Other | Admitting: Physical Therapy

## 2017-09-11 ENCOUNTER — Encounter: Payer: Self-pay | Admitting: Physical Therapy

## 2017-09-11 ENCOUNTER — Encounter: Payer: Self-pay | Admitting: Occupational Therapy

## 2017-09-11 ENCOUNTER — Ambulatory Visit: Payer: Medicare Other | Admitting: Occupational Therapy

## 2017-09-11 DIAGNOSIS — R278 Other lack of coordination: Secondary | ICD-10-CM

## 2017-09-11 DIAGNOSIS — M6281 Muscle weakness (generalized): Secondary | ICD-10-CM

## 2017-09-11 DIAGNOSIS — H543 Unqualified visual loss, both eyes: Secondary | ICD-10-CM

## 2017-09-11 DIAGNOSIS — R2681 Unsteadiness on feet: Secondary | ICD-10-CM

## 2017-09-11 DIAGNOSIS — R262 Difficulty in walking, not elsewhere classified: Secondary | ICD-10-CM

## 2017-09-11 NOTE — Therapy (Signed)
[place] MAIN [facility] SERVICES [address] [place], [place], [address] Phone: [phone]   Fax:  [phone]  Physical Therapy Treatment  Patient Details  Name: Haley Lucas MRN: [id number] Date of Birth: 1950/06/23 Referring Provider: [name]    Encounter Date: 09/11/2017  PT End of Session - 09/11/17 1123    Visit Number  17    Number of Visits  33    Date for PT Re-Evaluation  10/16/17    PT Start Time  1100    PT Stop Time  1145    PT Time Calculation (min)  45 min    Equipment Utilized During Treatment  Gait belt    Activity Tolerance  Patient tolerated treatment well    Behavior During Therapy  [facility] for tasks assessed/performed       Past Medical History:  Diagnosis Date  . High cholesterol   . Hypertension   . Thyroid disease     History reviewed. No pertinent surgical history.  There were no vitals filed for this visit.  Subjective Assessment - 09/11/17 1119    Subjective  Patient reports that she is not able to lie on her left side. She continues to have shoulder pain.    Currently in Pain?  Yes    Pain Score  7     Pain Location  Shoulder    Pain Orientation  Right;Left    Pain Descriptors / Indicators  Aching    Pain Type  Chronic pain    Pain Radiating Towards  na    Pain Onset  More than a month ago    Pain Frequency  Constant    Aggravating Factors   sidelying    Pain Relieving Factors  na    Effect of Pain on Daily Activities  na         NEUROMUSCULAR RE-EDUCATION  Toe tapping 6 inch stool without UE assist, cues for technique  Tandem gait in // bars x 4 laps , posture correction cues  Side stepping on blue  foam balance beam x 5 lengths of the parallel bars with posture correction cues  4 square fwd/bwd, side to side stepping/ diagonal stepping, cues to not step on the lines  ladder stepping fwd and side stepping    x 2 minutes, CGA and cues for posture  Stepping onto AIREX, then on to 4 inch  step followed by stepping down onto AIREX and then level surface in //bars x15.  Pt required occasional UE assist, and performance improved with each repetition    Stepping over and back fwd over hurdle x10 bilaterally  ; CGA  Side step and back over hurdle  x10 bilaterally; CGA    Patient required verbal and tactile cueing during dynamic standing balance activities in order to maintain center of gravity. Patient required CGA during all dynamic standing balance activities                     PT Education - 09/11/17 1121    Education provided  Yes    Education Details  HEP    Person(s) Educated  Patient    Methods  Explanation    Comprehension  Verbalized understanding       PT Short Term Goals - 08/21/17 1021      PT SHORT TERM GOAL #1   Title  Patient will be independent in home exercise program to improve strength/mobility for better functional independence with ADLs.  Baseline  Patient is doing well with her exercises    Time  4    Period  Weeks    Status  Achieved      PT SHORT TERM GOAL #2   Title  Patient (> 68 years old) will complete five times sit to stand test in < 15 seconds indicating an increased LE strength and improved balance.    Baseline  22.19 sec 07/31/17; 08/21/17: 19.5s    Time  4    Period  Weeks    Status  On-going    Target Date  09/18/17        PT Long Term Goals - 08/21/17 1022      PT LONG TERM GOAL #1   Title  Patient will increase six minute walk test distance to >1000 for progression to community ambulator and improve gait ability    Baseline  07/31/17 1085 feet; 08/21/17: 1005'    Time  8    Period  Weeks    Status  Achieved      PT LONG TERM GOAL #2   Title  Patient will increase BLE gross strength to 4+/5 as to improve functional strength for independent gait, increased standing tolerance and increased ADL ability.    Baseline  3+/5 BLE hips, 08/21/17: hip flex/ext/abd/add 4/5, hip IR/ER 4+/5, Knee ext: 5/5, Knee flex:  4/4,     Time  8    Period  Weeks    Status  Partially Met    Target Date  10/16/17      PT LONG TERM GOAL #3   Title  Patient will ascend/descend 4 stairs without rail assist independently without loss of balance to improve ability to get in/out of home.     Baseline  Definite need of railings and slow and guarded; 08/21/17: Need for rail, decreased speed, guarded    Time  8    Period  Weeks    Status  Partially Met    Target Date  10/16/17      PT LONG TERM GOAL #4   Title  Patient will reduce timed up and go to <11 seconds to reduce fall risk and demonstrate improved transfer/gait ability.    Baseline  19.87 sec 07/31/17; 08/21/17: 11.8 s    Time  8    Period  Weeks    Status  Partially Met    Target Date  10/16/17            Plan - 09/11/17 1123    Clinical Impression Statement  Continuous verbal cues and tactile cues needed to correct form with exercises and for posture correction. Patient required min verbal cues to perform exercises for posture and control and required verbal and tactile cues during all activities.  Patient is able to perform strengthening exercises with reports of pain. Patient will benefit from further skilled therapy to return to prior level of function.    Rehab Potential  Good    PT Frequency  2x / week    PT Duration  8 weeks    PT Treatment/Interventions  Gait training;Therapeutic exercise;Therapeutic activities;Stair training;Balance training;Neuromuscular re-education;Patient/family education;Manual techniques;Aquatic Therapy;Moist Heat;Electrical Stimulation    PT Next Visit Plan  balance and therapeutic exercise for hip weakness    Consulted and Agree with Plan of Care  Patient;Family member/caregiver       Patient will benefit from skilled therapeutic intervention in order to improve the following deficits and impairments:  Abnormal gait, Decreased balance, Decreased endurance, Decreased mobility,  Difficulty walking, Decreased knowledge of  precautions, Decreased activity tolerance, Decreased coordination, Decreased safety awareness, Decreased strength, Impaired flexibility, Postural dysfunction  Visit Diagnosis: Muscle weakness (generalized)  Other lack of coordination  Unsteadiness on feet  Low vision, both eyes  Difficulty in walking, not elsewhere classified     Problem List There are no active problems to display for this patient.   [address] [place] DPT 09/11/2017, 11:26 AM  [place] MAIN [facility] SERVICES [address] [place], [place], [address] Phone: [phone]   Fax:  [phone]  Name: Haley Lucas MRN: [id number] Date of Birth: January 26, 1951

## 2017-09-11 NOTE — Therapy (Addendum)
[facility] MAIN [facility] SERVICES [address] [place], [place], [address] Phone: [phone]   Fax:  [phone]  Occupational Therapy Treatment  Patient Details  Name: Haley Lucas MRN: [id number] Date of Birth: 04/26/1950 Referring Provider: Marshell Garfinkel    Encounter Date: 09/11/2017  OT End of Session - 09/11/17 1024    Visit Number  15    Number of Visits  24    Date for OT Re-Evaluation  09/18/17    Authorization Type  visit 5 of 10 for progress report period starting 08/21/2017    OT Start Time  1020    OT Stop Time  1100    OT Time Calculation (min)  40 min    Activity Tolerance  Patient tolerated treatment well    Behavior During Therapy  [facility] for tasks assessed/performed       Past Medical History:  Diagnosis Date  . High cholesterol   . Hypertension   . Thyroid disease     History reviewed. No pertinent surgical history.  There were no vitals filed for this visit.  Subjective Assessment - 09/11/17 1022    Subjective   Pt reports the her left shoulder is a little bit better.     Pertinent History  Pt is a 67 y.o. 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 Pt. received inpatient rehabilitation services followed by home health services. Pt. had surgery to repair a right drooping eyelid on February 19th., 2019. Pt. is ready for outpatient OT services.    Patient Stated Goals  OT services    Currently in Pain?  Yes    Pain Score  7        OT TREATMENT    Therapeutic Exercise:  Pt.worked on AROM in all joint ranges of the LUEin sitting, for scapular elevation, depression, abduction, rotation, retraction, internal, and external rotation.AROM with the left shoulder in supine for shoulder flexion, abduction, and protraction. Pt. Worked on AAROM at the wall, and using the ladder with bilateral UEs. Biliateral UE ROM with 1# dowel. AROM in sitting: 97 initially, 124 after treatment. Pt.worked on reaching  with the left UE using the vertical clips.   Manual Therapy:  Pt. Tolerated scapular mobilizations in elevation, depression, abduction, and rotation. Pt. Tolerated joint mobilizations AP for shoulder flexion in preparation for LUE ROM.                          OT Education - 09/11/17 1023    Education provided  Yes    Education Details  UE functioning    Person(s) Educated  Patient    Methods  Explanation;Demonstration    Comprehension  Verbalized understanding;Returned demonstration          OT Long Term Goals - 08/13/17 1658      OT LONG TERM GOAL #1   Title  Pt. will increase UE strength by 2 mm grades to assist with ADLs, and IADL    Baseline  Eval: Limited BUE strength UE shoulder strengthening not initiated secondary tobilateral shoulder pain.    Time  12    Period  Weeks    Status  On-going    Target Date  09/18/17      OT LONG TERM GOAL #2   Title  Pt. will improve right hand [facility] skills by 3 sec. to be able to manipulate ADL items.    Baseline  Eval:  Right: 47 sec.    Time  12    Period  Weeks    Status  On-going    Target Date  09/18/17      OT LONG TERM GOAL #3   Title  Pt. will demonstrate visual compensatory strategies 100% of the time during ADLs, and IADLs.    Baseline  EVal: Pt. unable    Time  12    Period  Weeks    Status  On-going    Target Date  09/18/17      OT LONG TERM GOAL #4   Title  Pt. will demonstrate cognitive compensatory strategies 100% of the time during ADLs, and IADLs.    Baseline  Eval: Pt. is unable    Time  12    Period  Weeks    Status  On-going    Target Date  09/18/17      OT LONG TERM GOAL #5   Title  Pt. will complete IADL, home management tasks with Supervision.     Baseline  Eval: Dependent    Time  12    Period  Weeks    Status  On-going    Target Date  09/18/17            Plan - 09/11/17 1024    Clinical Impression Statement  Pt. initially presented with 7/10 pain in the left  shoulder, shoulder flexion was 97 at the beginning of the session. Pt. responded well to the treatment session, and improved left active shoulder flexion to 124, and improved to 6/10 pain afterwards. Pt. continues to work on improviing LUE ROM, and pain, and improving functional use during ADL, and IADL tasks.    Occupational Profile and client history currently impacting functional performance  Pt. 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    Occupational performance deficits (Please refer to evaluation for details):  ADL's;IADL's    Rehab Potential  Good    Current Impairments/barriers affecting progress:  Positive indicators: age, family support, motivation, Negative indicators: multiple comorbidities.    OT Frequency  2x / week    OT Duration  12 weeks    OT Treatment/Interventions  Self-care/ADL training;Neuromuscular education;Therapeutic activities;Cognitive remediation/compensation;Passive range of motion;DME and/or AE instruction;Patient/family education;Energy conservation;Therapeutic exercise;Manual Therapy    Clinical Decision Making  Several treatment options, min-mod task modification necessary    Consulted and Agree with Plan of Care  Patient       Patient will benefit from skilled therapeutic intervention in order to improve the following deficits and impairments:  Pain, Impaired UE functional use, Decreased knowledge of precautions, Decreased cognition, Impaired tone, Decreased strength, Decreased endurance, Decreased activity tolerance, Decreased knowledge of use of DME, Decreased balance  Visit Diagnosis: Muscle weakness (generalized)  Other lack of coordination    Problem List There are no active problems to display for this patient.   [name], MS, OTR/L 09/11/2017, 12:47 PM  [place] [facility] MAIN [facility] SERVICES [address] [place], [place], [address] Phone: [phone]   Fax:  [phone]  Name:  Haley Lucas MRN: [id number] Date of Birth: [birth year]

## 2017-09-11 NOTE — Therapy (Signed)
[place] [facility] MAIN [facility] SERVICES [address] [place], [place], [address] Phone: [phone]   Fax:  [phone]  Occupational Therapy Treatment  Patient Details  Name: Haley Lucas MRN: [id number] Date of Birth: February 24, 1951 Referring Provider: Marshell Garfinkel    Encounter Date: 09/11/2017  OT End of Session - 09/11/17 1024    Visit Number  15    Number of Visits  24    Date for OT Re-Evaluation  09/18/17    Authorization Type  visit 5 of 10 for progress report period starting 08/21/2017    OT Start Time  1020    OT Stop Time  1100    OT Time Calculation (min)  40 min    Activity Tolerance  Patient tolerated treatment well    Behavior During Therapy  [facility] for tasks assessed/performed       Past Medical History:  Diagnosis Date  . High cholesterol   . Hypertension   . Thyroid disease     History reviewed. No pertinent surgical history.  There were no vitals filed for this visit.  Subjective Assessment - 09/11/17 1022    Subjective   Pt reports the her left shoulder is a little bit better.     Pertinent History  Pt is a 67 y.o. 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 Pt. received inpatient rehabilitation services followed by home health services. Pt. had surgery to repair a right drooping eyelid on February 19th., 2019. Pt. is ready for outpatient OT services.    Patient Stated Goals  OT services    Currently in Pain?  Yes    Pain Score  7                            OT Education - 09/11/17 1023    Education provided  Yes    Education Details  UE functioning    Person(s) Educated  Patient    Methods  Explanation;Demonstration    Comprehension  Verbalized understanding;Returned demonstration          OT Long Term Goals - 08/13/17 1658      OT LONG TERM GOAL #1   Title  Pt. will increase UE strength by 2 mm grades to assist with ADLs, and IADL    Baseline  Eval: Limited BUE strength  UE shoulder strengthening not initiated secondary tobilateral shoulder pain.    Time  12    Period  Weeks    Status  On-going    Target Date  09/18/17      OT LONG TERM GOAL #2   Title  Pt. will improve right hand [facility] skills by 3 sec. to be able to manipulate ADL items.    Baseline  Eval: Right: 47 sec.    Time  12    Period  Weeks    Status  On-going    Target Date  09/18/17      OT LONG TERM GOAL #3   Title  Pt. will demonstrate visual compensatory strategies 100% of the time during ADLs, and IADLs.    Baseline  EVal: Pt. unable    Time  12    Period  Weeks    Status  On-going    Target Date  09/18/17      OT LONG TERM GOAL #4   Title  Pt. will demonstrate cognitive compensatory strategies 100% of the time during  ADLs, and IADLs.    Baseline  Eval: Pt. is unable    Time  12    Period  Weeks    Status  On-going    Target Date  09/18/17      OT LONG TERM GOAL #5   Title  Pt. will complete IADL, home management tasks with Supervision.     Baseline  Eval: Dependent    Time  12    Period  Weeks    Status  On-going    Target Date  09/18/17            Plan - 09/11/17 1024    Clinical Impression Statement  Pt. initially presented with 7/10 pain in the left shoulder, shoulder flexion was 97 at the beginning of the session. Pt. responded well to the treatment session, and improved left active shoulder flexion to 124, and improved to 6/10 pain afterwards. Pt. continues to work on improviing LUE ROM, and pain, and improving functional use during ADL, and IADL tasks.    Occupational Profile and client history currently impacting functional performance  Pt. 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    Occupational performance deficits (Please refer to evaluation for details):  ADL's;IADL's    Rehab Potential  Good    Current Impairments/barriers affecting progress:  Positive indicators: age, family support, motivation, Negative indicators: multiple  comorbidities.    OT Frequency  2x / week    OT Duration  12 weeks    OT Treatment/Interventions  Self-care/ADL training;Neuromuscular education;Therapeutic activities;Cognitive remediation/compensation;Passive range of motion;DME and/or AE instruction;Patient/family education;Energy conservation;Therapeutic exercise;Manual Therapy    Clinical Decision Making  Several treatment options, min-mod task modification necessary    Consulted and Agree with Plan of Care  Patient       Patient will benefit from skilled therapeutic intervention in order to improve the following deficits and impairments:  Pain, Impaired UE functional use, Decreased knowledge of precautions, Decreased cognition, Impaired tone, Decreased strength, Decreased endurance, Decreased activity tolerance, Decreased knowledge of use of DME, Decreased balance  Visit Diagnosis: Muscle weakness (generalized)  Other lack of coordination    Problem List There are no active problems to display for this patient.   [name] 09/11/2017, 12:46 PM  [place] [facility] MAIN [facility] SERVICES [address] [place], [place], [address] Phone: [phone]   Fax:  [phone]  Name: Haley Lucas MRN: [id number] Date of Birth: October 19, 1950

## 2017-09-17 ENCOUNTER — Ambulatory Visit: Payer: Medicare Other | Attending: Family Medicine | Admitting: Occupational Therapy

## 2017-09-17 ENCOUNTER — Encounter: Payer: Self-pay | Admitting: Physical Therapy

## 2017-09-17 ENCOUNTER — Encounter: Payer: Self-pay | Admitting: Occupational Therapy

## 2017-09-17 ENCOUNTER — Ambulatory Visit: Payer: Medicare Other | Admitting: Physical Therapy

## 2017-09-17 DIAGNOSIS — R2681 Unsteadiness on feet: Secondary | ICD-10-CM

## 2017-09-17 DIAGNOSIS — R262 Difficulty in walking, not elsewhere classified: Secondary | ICD-10-CM | POA: Diagnosis present

## 2017-09-17 DIAGNOSIS — M6281 Muscle weakness (generalized): Secondary | ICD-10-CM

## 2017-09-17 DIAGNOSIS — R278 Other lack of coordination: Secondary | ICD-10-CM | POA: Insufficient documentation

## 2017-09-17 DIAGNOSIS — H543 Unqualified visual loss, both eyes: Secondary | ICD-10-CM | POA: Insufficient documentation

## 2017-09-17 NOTE — Therapy (Signed)
[place] [facility] MAIN [facility] SERVICES [address] [place], [place], [address] Phone: [phone]   Fax:  [phone]  Occupational Therapy Treatment  Patient Details  Name: Haley Lucas MRN: [id number] Date of Birth: 07-17-1950 Referring Provider: Marshell Garfinkel    Encounter Date: 09/17/2017  OT End of Session - 09/17/17 1329    Visit Number  16    Number of Visits  24    Date for OT Re-Evaluation  12/10/17    Authorization Type  visit 6 of 10 for progress report period starting 08/21/2017    OT Start Time  1300    OT Stop Time  1345    OT Time Calculation (min)  45 min    Activity Tolerance  Patient tolerated treatment well    Behavior During Therapy  [facility] for tasks assessed/performed       Past Medical History:  Diagnosis Date  . High cholesterol   . Hypertension   . Thyroid disease     History reviewed. No pertinent surgical history.  There were no vitals filed for this visit.  Subjective Assessment - 09/17/17 1326    Subjective   Pt reports that her left shoulder is a little better.     Patient Stated Goals  OT services    Currently in Pain?  Yes    Pain Score  7  0/10 sitting. Pt. worsens when sleeping.    Pain Location  Shoulder    Pain Orientation  Left    Pain Descriptors / Indicators  Aching;Sore    Pain Score  5    Pain Location  Shoulder    Pain Orientation  Right    Pain Descriptors / Indicators  Sore    Pain Radiating Towards  Sleping at night is worse.         [facility] OT Assessment - 09/17/17 1335      Coordination   Right 9 Hole Peg Test  21    Left 9 Hole Peg Test  25        OT TREATMENT    Measurements were obtained, and goals were reviewed with the pt.  Therapeutic Exercise:  Pt.worked on AROM in all joint ranges of the LUEin sitting, for scapular elevation, depression, abduction, rotation, retraction, internal, and external rotation.AROM with the left shoulder in supine for shoulder flexion, abduction,  and protraction. Pt. Worked on AAROM atthe wall, Biliateral UE ROM with 1# dowel. Left shoulder AROM in sitting: Flexion: 108 initially, 121 after treatment. Supine: initially: 125, after treatment: 138. Abduction: initially: 82, after treatment: 90   Manual Therapy:  Pt. Tolerated scapular mobilizations in elevation, depression, abduction, and rotation. Pt. Tolerated joint mobilizations AP for shoulder flexion in preparation for LUE ROM.                   OT Education - 09/17/17 1328    Education provided  Yes    Education Details  UE ROM    Person(s) Educated  Patient    Methods  Explanation;Demonstration;Tactile cues;Verbal cues    Comprehension  Verbalized understanding          OT Long Term Goals - 09/17/17 1332      OT LONG TERM GOAL #1   Title  Pt. will increase UE strength by 2 mm grades to assist with ADLs, and IADL    Baseline  09/17/2017: Pt, is progressing with ROM, strengthening N/A secondary to bilateral shoulder pain    Time  12    Period  Weeks    Status  Deferred    Target Date  12/10/17      OT LONG TERM GOAL #2   Title  Pt. will improve right hand [facility] skills by 3 sec. to be able to manipulate ADL items.    Baseline  09/17/2017:  Right: 21 sec., Left 25 sec.    Time  12    Period  Weeks    Status  On-going    Target Date  12/10/17      OT LONG TERM GOAL #3   Title  Pt. will demonstrate visual compensatory strategies 100% of the time during ADLs, and IADLs.    Baseline  09/17/2017: Pt. is improving    Time  12    Period  Weeks    Status  On-going    Target Date  12/10/17      OT LONG TERM GOAL #4   Title  Pt. will demonstrate cognitive compensatory strategies 100% of the time during ADLs, and IADLs.    Baseline  Pt. cognitive staus is back to baseline    Time  12    Period  Weeks    Status  Achieved      OT LONG TERM GOAL #5   Title  Pt. will complete IADL, home management tasks with Supervision.     Baseline  09/17/2017: MinA     Time  12    Period  Weeks    Status  On-going    Target Date  12/10/17      OT LONG TERM GOAL #6   Title  Pt. will increase left shoulder ROM to be able to independently retrieve items from the cabinetry/colsets.    Baseline  09/27/2017: Pt. sitting AROM shoulder flexion: 108, abduction: 82, supine shoulder flexion: 125    Time  12    Period  Weeks    Status  New    Target Date  12/10/17            Plan - 09/17/17 1330    Clinical Impression Statement  Pt. reports Bilateral shoulder pain in improving. Pt. is improving with ROM Sitting Left shoulder flexion: 108 initially, and improved to 121 after treatment; Abduction 82 degrees initially improved to 90 degrees after treatment. In supine, shoulder flexion was 125 initially, and improved to 138 following treatment. Pt. conitnues to make progress, and is responding well to the treatment. Pt. continues to work on improving LUE ROM, in order to improve functional UE use during ADLs, and IADLs. Goals were reviewed with the pt.     Occupational Profile and client history currently impacting functional performance  Pt. 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    Occupational performance deficits (Please refer to evaluation for details):  ADL's;IADL's    Rehab Potential  Good    Current Impairments/barriers affecting progress:  Positive indicators: age, family support, motivation, Negative indicators: multiple comorbidities.    OT Frequency  2x / week    OT Duration  12 weeks    OT Treatment/Interventions  Self-care/ADL training;Neuromuscular education;Therapeutic activities;Cognitive remediation/compensation;Passive range of motion;DME and/or AE instruction;Patient/family education;Energy conservation;Therapeutic exercise;Manual Therapy    Clinical Decision Making  Several treatment options, min-mod task modification necessary    Consulted and Agree with Plan of Care  Patient       Patient will benefit from  skilled therapeutic intervention in order to improve the following deficits and impairments:  Pain, Impaired  UE functional use, Decreased knowledge of precautions, Decreased cognition, Impaired tone, Decreased strength, Decreased endurance, Decreased activity tolerance, Decreased knowledge of use of DME, Decreased balance  Visit Diagnosis: Muscle weakness (generalized)    Problem List There are no active problems to display for this patient.   Olegario MessierElaine Ramie Palladino, MS, OTR/L 09/17/2017, 6:04 PM  [place] [facility]IN [facility] [address] [place], [place], [address] Phone: [phone]   Fax:  [phone]  Name: Lavella LemonsHyun S Neyens MRN: [id number] Date of Birth: 08/21/1950

## 2017-09-17 NOTE — Therapy (Signed)
[place] MAIN [facility] SERVICES [address] [place], [place], [address] Phone: [phone]   Fax:  [phone]  Physical Therapy Treatment  Patient Details  Name: Haley Lucas MRN: [id number] Date of Birth: 08-03-1950 Referring Provider: [name]    Encounter Date: 09/17/2017  PT End of Session - 09/17/17 1642    Visit Number  18    Number of Visits  33    Date for PT Re-Evaluation  10/16/17    PT Start Time  0145    PT Stop Time  0230    PT Time Calculation (min)  45 min    Equipment Utilized During Treatment  Gait belt    Activity Tolerance  Patient tolerated treatment well    Behavior During Therapy  [facility] for tasks assessed/performed       Past Medical History:  Diagnosis Date  . High cholesterol   . Hypertension   . Thyroid disease     History reviewed. No pertinent surgical history.  There were no vitals filed for this visit.  Subjective Assessment - 09/17/17 1641    Subjective  Patient reports that she is not able to lie on her left side. She continues to have shoulder pain.    Pertinent History  She had a hughes flap to her eye right eye feb 18th at [facility]. She was in the hospital for a month from the first surgey and was discharged Dec 11th. She had PT and OT for  2 weeks and then HHPT PT, OT and ST.  Tha ended christmas. She was not using a Assistive devie prior to surgery. She has weakness in her arms and she is not able to walk her normal distances.     Limitations  Standing    How long can you stand comfortably?  10 mins    Patient Stated Goals  to be able to walk and stand for longer periods of time, and improve balance.     Currently in Pain?  Yes    Pain Score  7     Pain Location  Shoulder    Pain Orientation  Right;Left    Pain Descriptors / Indicators  Aching    Pain Onset  More than a month ago    Multiple Pain Sites  No    Pain Onset  More than a month ago       Therapeutic exercise and neuromuscular  training: 1/2 foam flat side up and balance with head turns left and right feet apart and feet together,; cues for keeping balance and using ankle and hips to maintain center of gravity  tandem standing on 1/2 foam  ; cues to maintain balance and posture correction standing hip abd with YTB x 20  ; cues for correct position and technique side stepping left and right in parallel bars 10 feet x 3; rotation cues for upright posture and not rotating hips towards the direction of motion step ups from floor to 6 inch stool x 20 bilateral; cues to try not to use UE and to keep correct head position leg press 75 lbs x 20 x 3, cues not to snap knees during extension and to perform slowly .  Tm walking 1. 5 m/hour x 5 mins, cues for heel toe gait  Standing mini squat 2x10;  standing ankle DF/PF 2x10;  standing hip abd 2x10;  standing march 2x10;  standing hip extension 2x10 ; standing hamstring curl 2x10  Standing on  AIREX narrow BOS 1 min x 3 Pt requires mod verbal and tactile cues for proper exercise performance                          PT Education - 09/17/17 1642    Education provided  Yes    Education Details  HEP    Person(s) Educated  Patient    Methods  Explanation    Comprehension  Verbalized understanding       PT Short Term Goals - 08/21/17 1021      PT SHORT TERM GOAL #1   Title  Patient will be independent in home exercise program to improve strength/mobility for better functional independence with ADLs.    Baseline  Patient is doing well with her exercises    Time  4    Period  Weeks    Status  Achieved      PT SHORT TERM GOAL #2   Title  Patient (> 46 years old) will complete five times sit to stand test in < 15 seconds indicating an increased LE strength and improved balance.    Baseline  22.19 sec 07/31/17; 08/21/17: 19.5s    Time  4    Period  Weeks    Status  On-going    Target Date  09/18/17        PT Long Term Goals - 08/21/17 1022       PT LONG TERM GOAL #1   Title  Patient will increase six minute walk test distance to >1000 for progression to community ambulator and improve gait ability    Baseline  07/31/17 1085 feet; 08/21/17: 1005'    Time  8    Period  Weeks    Status  Achieved      PT LONG TERM GOAL #2   Title  Patient will increase BLE gross strength to 4+/5 as to improve functional strength for independent gait, increased standing tolerance and increased ADL ability.    Baseline  3+/5 BLE hips, 08/21/17: hip flex/ext/abd/add 4/5, hip IR/ER 4+/5, Knee ext: 5/5, Knee flex: 4/4,     Time  8    Period  Weeks    Status  Partially Met    Target Date  10/16/17      PT LONG TERM GOAL #3   Title  Patient will ascend/descend 4 stairs without rail assist independently without loss of balance to improve ability to get in/out of home.     Baseline  Definite need of railings and slow and guarded; 08/21/17: Need for rail, decreased speed, guarded    Time  8    Period  Weeks    Status  Partially Met    Target Date  10/16/17      PT LONG TERM GOAL #4   Title  Patient will reduce timed up and go to <11 seconds to reduce fall risk and demonstrate improved transfer/gait ability.    Baseline  19.87 sec 07/31/17; 08/21/17: 11.8 s    Time  8    Period  Weeks    Status  Partially Met    Target Date  10/16/17            Plan - 09/17/17 1643    Clinical Impression Statement  Patient required min verbal cueing during strengthening exercise to correct posture and form. Patient demonstrates decreased gait speed  with ambulation.  Patient demonstrates ability to perform strengthening exercises with minimal pain but with moderate  fatigue. Patient will continue to benefit from continued skilled therapy in order to improve dynamic standing balance and increase strength in order to improve gait and mobility.    Rehab Potential  Good    PT Frequency  2x / week    PT Duration  8 weeks    PT Treatment/Interventions  Gait training;Therapeutic  exercise;Therapeutic activities;Stair training;Balance training;Neuromuscular re-education;Patient/family education;Manual techniques;Aquatic Therapy;Moist Heat;Electrical Stimulation    PT Next Visit Plan  balance and therapeutic exercise for hip weakness    Consulted and Agree with Plan of Care  Patient;Family member/caregiver       Patient will benefit from skilled therapeutic intervention in order to improve the following deficits and impairments:  Abnormal gait, Decreased balance, Decreased endurance, Decreased mobility, Difficulty walking, Decreased knowledge of precautions, Decreased activity tolerance, Decreased coordination, Decreased safety awareness, Decreased strength, Impaired flexibility, Postural dysfunction  Visit Diagnosis: Muscle weakness (generalized)  Other lack of coordination  Unsteadiness on feet  Low vision, both eyes  Difficulty in walking, not elsewhere classified     Problem List There are no active problems to display for this patient.   [address] , [place] DPT 09/17/2017, 5:04 PM  [place] MAIN [facility] SERVICES [address] [place], [place], [address] Phone: [phone]   Fax:  [phone]  Name: ADEAN MILOSEVIC MRN: [id number] Date of Birth: 05/07/1950

## 2017-09-19 ENCOUNTER — Encounter: Payer: Self-pay | Admitting: Occupational Therapy

## 2017-09-19 ENCOUNTER — Encounter: Payer: Self-pay | Admitting: Physical Therapy

## 2017-09-19 ENCOUNTER — Ambulatory Visit: Payer: Medicare Other | Admitting: Physical Therapy

## 2017-09-19 ENCOUNTER — Ambulatory Visit: Payer: Medicare Other | Admitting: Occupational Therapy

## 2017-09-19 DIAGNOSIS — H543 Unqualified visual loss, both eyes: Secondary | ICD-10-CM

## 2017-09-19 DIAGNOSIS — M6281 Muscle weakness (generalized): Secondary | ICD-10-CM

## 2017-09-19 DIAGNOSIS — R262 Difficulty in walking, not elsewhere classified: Secondary | ICD-10-CM

## 2017-09-19 DIAGNOSIS — R278 Other lack of coordination: Secondary | ICD-10-CM

## 2017-09-19 DIAGNOSIS — R2681 Unsteadiness on feet: Secondary | ICD-10-CM

## 2017-09-19 NOTE — Therapy (Signed)
[facility] MAIN [facility] SERVICES [address] [place], [place], [address] Phone: [phone]   Fax:  [phone]  Occupational Therapy Treatment  Patient Details  Name: Haley Lucas MRN: [id number] Date of Birth: 1950-06-28 Referring Provider: Marshell Garfinkel    Encounter Date: 09/19/2017  OT End of Session - 09/19/17 1309    Visit Number  17    Number of Visits  24    Date for OT Re-Evaluation  12/10/17    Authorization Type  visit 7 of 10 for progress report period starting 08/21/2017    OT Start Time  1300    OT Stop Time  1345    OT Time Calculation (min)  45 min    Activity Tolerance  Patient tolerated treatment well    Behavior During Therapy  [facility] for tasks assessed/performed       Past Medical History:  Diagnosis Date  . High cholesterol   . Hypertension   . Thyroid disease     History reviewed. No pertinent surgical history.  There were no vitals filed for this visit.  Subjective Assessment - 09/19/17 1308    Subjective   Pt. reports her pain in improving.    Pertinent History  Pt is a 67 y.o. 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 Pt. received inpatient rehabilitation services followed by home health services. Pt. had surgery to repair a right drooping eyelid on February 19th., 2019. Pt. is ready for outpatient OT services.    Patient Stated Goals  OT services    Currently in Pain?  No/denies       OT TREATMENT   Therapeutic Exercise:  Pt.worked on AROM in all joint ranges of the LUEin sitting, for scapular elevation, depression, abduction, rotation, retraction, internal, and external rotation.AROM with the left shoulder in supine for shoulder flexion, abduction, and protraction. Pt. Worked on AAROM atthe wall,Biliateral UE ROM with 1# dowel. Pt. worked on reaching with the LUE using the finger ladder. Pt. Worked on reaching tasks with the LUE using resistive clips. Left shoulder AROM in  sitting: Flexion: 113 initially, 128 after treatment. Supine: initially: 126, after treatment: 138. Abduction: initially: 90, after treatment: 90 Pt. worked on reaching with the LUE using the finger ladder. Pt. Worked on reaching tasks with the LUE using resistive clips.   Manual Therapy:  Pt. Tolerated scapular mobilizations in elevation, depression, abduction, and rotation. Pt. Tolerated joint mobilizations AP for shoulder flexion in preparation for LUE ROM.                            OT Education - 09/19/17 1309    Education provided  Yes    Education Details  UE ROM    Person(s) Educated  Patient    Methods  Explanation;Demonstration;Tactile cues;Verbal cues    Comprehension  Verbalized understanding          OT Long Term Goals - 09/17/17 1332      OT LONG TERM GOAL #1   Title  Pt. will increase UE strength by 2 mm grades to assist with ADLs, and IADL    Baseline  09/17/2017: Pt, is progressing with ROM, strengthening N/A secondary to bilateral shoulder pain    Time  12    Period  Weeks    Status  Deferred    Target Date  12/10/17      OT LONG TERM GOAL #2  Title  Pt. will improve right hand [facility] skills by 3 sec. to be able to manipulate ADL items.    Baseline  09/17/2017:  Right: 21 sec., Left 25 sec.    Time  12    Period  Weeks    Status  On-going    Target Date  12/10/17      OT LONG TERM GOAL #3   Title  Pt. will demonstrate visual compensatory strategies 100% of the time during ADLs, and IADLs.    Baseline  09/17/2017: Pt. is improving    Time  12    Period  Weeks    Status  On-going    Target Date  12/10/17      OT LONG TERM GOAL #4   Title  Pt. will demonstrate cognitive compensatory strategies 100% of the time during ADLs, and IADLs.    Baseline  Pt. cognitive staus is back to baseline    Time  12    Period  Weeks    Status  Achieved      OT LONG TERM GOAL #5   Title  Pt. will complete IADL, home management tasks with  Supervision.     Baseline  09/17/2017: MinA    Time  12    Period  Weeks    Status  On-going    Target Date  12/10/17      OT LONG TERM GOAL #6   Title  Pt. will increase left shoulder ROM to be able to independently retrieve items from the cabinetry/colsets.    Baseline  09/27/2017: Pt. sitting AROM shoulder flexion: 108, abduction: 82, supine shoulder flexion: 125    Time  12    Period  Weeks    Status  New    Target Date  12/10/17            Plan - 09/19/17 1310    Clinical Impression Statement  Pt. reports that her bilateral shoulder pain is improving overall. Pt.'s left shoulder ROM is improving.  Pt. continues to progress with LUE ROM, Shoulder flexion in sititng 113 at the beginning of the session, improved to 128 after treatment. Abduction in sitting is 90.  Supine at the beginning of the session was 126, after treatment it improved to 138.    Occupational Profile and client history currently impacting functional performance  Pt. 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    Occupational performance deficits (Please refer to evaluation for details):  ADL's;IADL's    Rehab Potential  Good    Current Impairments/barriers affecting progress:  Positive indicators: age, family support, motivation, Negative indicators: multiple comorbidities.    OT Frequency  2x / week    OT Duration  12 weeks    OT Treatment/Interventions  Self-care/ADL training;Neuromuscular education;Therapeutic activities;Cognitive remediation/compensation;Passive range of motion;DME and/or AE instruction;Patient/family education;Energy conservation;Therapeutic exercise;Manual Therapy    Clinical Decision Making  Several treatment options, min-mod task modification necessary    Consulted and Agree with Plan of Care  Patient       Patient will benefit from skilled therapeutic intervention in order to improve the following deficits and impairments:  Pain, Impaired UE functional use,  Decreased knowledge of precautions, Decreased cognition, Impaired tone, Decreased strength, Decreased endurance, Decreased activity tolerance, Decreased knowledge of use of DME, Decreased balance  Visit Diagnosis: Muscle weakness (generalized)  Other lack of coordination    Problem List There are no active problems to display for this patient.   [name],  MS, OTR/L 09/19/2017, 5:59 PM  [place] [facility] MAIN [facility] SERVICES [address] , [place], [address] Phone: [phone]   Fax:  [phone]  Name: Haley Lucas MRN: [id number] Date of Birth: 07/12/1950

## 2017-09-19 NOTE — Therapy (Signed)
[place] MAIN [facility] SERVICES [address] [place], [place], [address] Phone: [phone]   Fax:  [phone]  Physical Therapy Treatment  Patient Details  Name: Haley Lucas MRN: [id number] Date of Birth: 1951/04/06 Referring Provider: [name]    Encounter Date: 09/19/2017  PT End of Session - 09/19/17 1343    Visit Number  19    Number of Visits  33    Date for PT Re-Evaluation  10/16/17    PT Start Time  0145    PT Stop Time  0230    PT Time Calculation (min)  45 min    Equipment Utilized During Treatment  Gait belt    Activity Tolerance  Patient tolerated treatment well    Behavior During Therapy  [facility] for tasks assessed/performed       Past Medical History:  Diagnosis Date  . High cholesterol   . Hypertension   . Thyroid disease     History reviewed. No pertinent surgical history.  There were no vitals filed for this visit.  Subjective Assessment - 09/19/17 1359    Subjective  Patient reports that she is not able to lie on her left side. She continues to have shoulder pain.    Pertinent History  She had a hughes flap to her eye right eye feb 18th at [facility]. She was in the hospital for a month from the first surgey and was discharged Dec 11th. She had PT and OT for  2 weeks and then HHPT PT, OT and ST.  Tha ended christmas. She was not using a Assistive devie prior to surgery. She has weakness in her arms and she is not able to walk her normal distances.     Limitations  Standing    How long can you stand comfortably?  10 mins    Patient Stated Goals  to be able to walk and stand for longer periods of time, and improve balance.     Currently in Pain?  Yes    Pain Score  6     Pain Location  Shoulder    Pain Orientation  Right;Left    Pain Descriptors / Indicators  Aching    Pain Type  Chronic pain    Pain Onset  More than a month ago    Aggravating Factors   lying down     Effect of Pain on Daily Activities  diffiult  to use her hands    Multiple Pain Sites  No    Pain Onset  More than a month ago      Treatment: Warm up on [employer] x 5 mins L 4  Neuromuscular training:  Resisted walking: 12.5# forward/backward, side/side (x4 way) x2 laps each direction with CGA for safety and cues to improve step length for better balance challenge; Required cues to slow down eccentric return for better balance control  In parallel bars: Heel toe walking on the line x 10 x 4 and need of trunk support 75% and cue to look up     Standing on 1/2 bolster (Flat side up): Heel/toe rock x15 with finger tip hold for balance with cues to keep knee straight for better ankle strategies; Feet in neutral, BUE wand flexion x15 reps with min A for safety and cues to improve weight shift for better stance control; patient was able to utilize ankle strategies well with less loss of balance; Side by side stand on  1/2 bolster 10  sec hold unsupported x2 reps each foot in front with min A for safety and cues to improve upper weight shift and trunk control for better balance in narrow base of support  Standing on airex: Heel/toe raises x15 with 2 rail assist  Alternate toe taps on 4inch step x10 with 1-0 rail assist- required max VCs and visual cues for correct technique/sequencing Modified tandem stance: BUE trunk rotation left and right  x5 reps each direction with CGA for safety and cues to keep both hands on ball for better trunk rotation and balance challenge  Patient required min verbal/tactile cues for correct exercise technique and to reduce rail assist for better balance challenge;   :                          PT Education - 09/19/17 1342    Education provided  Yes    Education Details  HEP    Person(s) Educated  Patient    Methods  Explanation;Demonstration;Tactile cues    Comprehension  Verbalized understanding;Returned demonstration       PT Short Term Goals - 08/21/17 1021      PT  SHORT TERM GOAL #1   Title  Patient will be independent in home exercise program to improve strength/mobility for better functional independence with ADLs.    Baseline  Patient is doing well with her exercises    Time  4    Period  Weeks    Status  Achieved      PT SHORT TERM GOAL #2   Title  Patient (> 67 years old) will complete five times sit to stand test in < 15 seconds indicating an increased LE strength and improved balance.    Baseline  22.19 sec 07/31/17; 08/21/17: 19.5s    Time  4    Period  Weeks    Status  On-going    Target Date  09/18/17        PT Long Term Goals - 08/21/17 1022      PT LONG TERM GOAL #1   Title  Patient will increase six minute walk test distance to >1000 for progression to community ambulator and improve gait ability    Baseline  07/31/17 1085 feet; 08/21/17: 1005'    Time  8    Period  Weeks    Status  Achieved      PT LONG TERM GOAL #2   Title  Patient will increase BLE gross strength to 4+/5 as to improve functional strength for independent gait, increased standing tolerance and increased ADL ability.    Baseline  3+/5 BLE hips, 08/21/17: hip flex/ext/abd/add 4/5, hip IR/ER 4+/5, Knee ext: 5/5, Knee flex: 4/4,     Time  8    Period  Weeks    Status  Partially Met    Target Date  10/16/17      PT LONG TERM GOAL #3   Title  Patient will ascend/descend 4 stairs without rail assist independently without loss of balance to improve ability to get in/out of home.     Baseline  Definite need of railings and slow and guarded; 08/21/17: Need for rail, decreased speed, guarded    Time  8    Period  Weeks    Status  Partially Met    Target Date  10/16/17      PT LONG TERM GOAL #4   Title  Patient will reduce timed up and go to <11 seconds  to reduce fall risk and demonstrate improved transfer/gait ability.    Baseline  19.87 sec 07/31/17; 08/21/17: 11.8 s    Time  8    Period  Weeks    Status  Partially Met    Target Date  10/16/17            Plan -  09/19/17 1343    Clinical Impression Statement  Patient demonstrates better tandem stance being able to maintain on uneven surface with CGA, she continues to have difficulty with tandem walking.   Patient continues to have trouble with reaching outside of base of support .Patient would benefit from additional skilled PT Intervention to improve LE strength, balance and gait safety and return to PLOF.    Rehab Potential  Good    PT Frequency  2x / week    PT Duration  8 weeks    PT Treatment/Interventions  Gait training;Therapeutic exercise;Therapeutic activities;Stair training;Balance training;Neuromuscular re-education;Patient/family education;Manual techniques;Aquatic Therapy;Moist Heat;Electrical Stimulation    PT Next Visit Plan  balance and therapeutic exercise for hip weakness    Consulted and Agree with Plan of Care  Patient;Family member/caregiver       Patient will benefit from skilled therapeutic intervention in order to improve the following deficits and impairments:  Abnormal gait, Decreased balance, Decreased endurance, Decreased mobility, Difficulty walking, Decreased knowledge of precautions, Decreased activity tolerance, Decreased coordination, Decreased safety awareness, Decreased strength, Impaired flexibility, Postural dysfunction  Visit Diagnosis: Muscle weakness (generalized)  Other lack of coordination  Unsteadiness on feet  Low vision, both eyes  Difficulty in walking, not elsewhere classified     Problem List There are no active problems to display for this patient.   [address] [place] DPT 09/19/2017, 2:01 PM  [place] MAIN [facility] SERVICES [address] [place], [place], [address] Phone: [phone]   Fax:  [phone]  Name: Haley Lucas MRN: [id number] Date of Birth: 01-27-1951

## 2017-09-23 ENCOUNTER — Ambulatory Visit: Payer: Medicare Other | Admitting: Physical Therapy

## 2017-09-23 ENCOUNTER — Ambulatory Visit: Payer: Medicare Other | Admitting: Occupational Therapy

## 2017-09-23 ENCOUNTER — Other Ambulatory Visit: Payer: Self-pay

## 2017-09-23 ENCOUNTER — Encounter: Payer: Self-pay | Admitting: Occupational Therapy

## 2017-09-23 ENCOUNTER — Encounter: Payer: Self-pay | Admitting: Physical Therapy

## 2017-09-23 DIAGNOSIS — H543 Unqualified visual loss, both eyes: Secondary | ICD-10-CM

## 2017-09-23 DIAGNOSIS — R262 Difficulty in walking, not elsewhere classified: Secondary | ICD-10-CM

## 2017-09-23 DIAGNOSIS — R278 Other lack of coordination: Secondary | ICD-10-CM

## 2017-09-23 DIAGNOSIS — M6281 Muscle weakness (generalized): Secondary | ICD-10-CM

## 2017-09-23 DIAGNOSIS — R2681 Unsteadiness on feet: Secondary | ICD-10-CM

## 2017-09-23 NOTE — Therapy (Signed)
[facility] MAIN [facility] SERVICES [address] [place], [place], [address] Phone: [phone]   Fax:  [phone]  Occupational Therapy Treatment  Patient Details  Name: Haley LemonsHyun S Stineman MRN: [id number] Date of Birth: 10/07/1950 Referring Provider: Marshell GarfinkelO, SARAH J    Encounter Date: 09/23/2017  OT End of Session - 09/23/17 1747    Visit Number  18    Number of Visits  24    Date for OT Re-Evaluation  12/10/17    OT Start Time  1345    OT Stop Time  1425    OT Time Calculation (min)  40 min    Activity Tolerance  Patient tolerated treatment well    Behavior During Therapy  [facility] for tasks assessed/performed       Past Medical History:  Diagnosis Date  . High cholesterol   . Hypertension   . Thyroid disease     History reviewed. No pertinent surgical history.  There were no vitals filed for this visit.  Subjective Assessment - 09/23/17 1735    Subjective   Pt reports that her arm pain has improved but her face pain on the R is worse and she is seeing her doctor soon.    Pertinent History  Pt is a 67 y.o. female who [facility]. Pt. received inpatient rehabilitation services followed by home health services. Pt. had surgery to repair a right drooping eyelid on February 19th., 2019. Pt. is ready for outpatient OT services.    Patient Stated Goals  OT services    Currently in Pain?  Yes    Pain Score  6     Pain Location  Shoulder    Pain Orientation  Right;Left    Pain Descriptors / Indicators  Aching    Pain Type  Chronic pain    Pain Radiating Towards  NA    Pain Onset  More than a month ago    Pain Frequency  Constant    Aggravating Factors   sleeping and lying on her side    Pain Relieving Factors  nothing    Effect of Pain on Daily Activities  difficulty with any tasks abover shoulder level    Multiple Pain Sites  Yes    Pain Score  6    Pain Location  Face    Pain Orientation  Right     Pain Descriptors / Indicators  Discomfort;Constant    Pain Type  Chronic pain    Pain Radiating Towards  NA    Pain Onset  In the past 7 days    Pain Frequency  Constant    Aggravating Factors   talking or smiling    Pain Relieving Factors  nothing seems to work per pt report    Effect of Pain on Daily Activities  difficult to talk                   OT Treatments/Exercises (OP) - 09/23/17 1738      ADLs   ADL Comments  Pt seen in kitchen to work on reaching, holding and placing variety of objects like plates, bowls, cups, jars, cans, measuring cup, rolling pin including opening and closing cupboards and drawers to find items.  No complaints of pain as long shoulder flexion was less than 95 degrees.  Pt has difficulty with reaching into back of fridge to retrieve items on shelves and rec looking into options for shelves that  slide forward or use cookie sheets on shelves to be able to pull items to her.        Neurological Re-education Exercises   Other Exercises 1  Pt able to use finger wall to level 20 when facing finger wall and to 17 when standing perpendicular to finger wall with elbow extended as a warm up and increasing AROM before reaching task in kitchen this session.    Other Exercises 2  Pt participated in reaching activity using a yard stick held vertically to reach and clip resistive clips from yellow to black in resistance.  Pt able to complete 2 sets of 10 medium green ball exercises while in sitting using shoulder flexion to 95 degrees with elbows extended.  Stretching table top for 3 minutes after with good response.               OT Education - 09/23/17 1747    Education provided  Yes    Education Details  UE ROM    Person(s) Educated  Patient    Methods  Explanation;Demonstration;Tactile cues;Verbal cues    Comprehension  Verbalized understanding          OT Long Term Goals - 09/17/17 1332      OT LONG TERM GOAL #1   Title  Pt. will increase UE  strength by 2 mm grades to assist with ADLs, and IADL    Baseline  09/17/2017: Pt, is progressing with ROM, strengthening N/A secondary to bilateral shoulder pain    Time  12    Period  Weeks    Status  Deferred    Target Date  12/10/17      OT LONG TERM GOAL #2   Title  Pt. will improve right hand [facility] skills by 3 sec. to be able to manipulate ADL items.    Baseline  09/17/2017:  Right: 21 sec., Left 25 sec.    Time  12    Period  Weeks    Status  On-going    Target Date  12/10/17      OT LONG TERM GOAL #3   Title  Pt. will demonstrate visual compensatory strategies 100% of the time during ADLs, and IADLs.    Baseline  09/17/2017: Pt. is improving    Time  12    Period  Weeks    Status  On-going    Target Date  12/10/17      OT LONG TERM GOAL #4   Title  Pt. will demonstrate cognitive compensatory strategies 100% of the time during ADLs, and IADLs.    Baseline  Pt. cognitive staus is back to baseline    Time  12    Period  Weeks    Status  Achieved      OT LONG TERM GOAL #5   Title  Pt. will complete IADL, home management tasks with Supervision.     Baseline  09/17/2017: MinA    Time  12    Period  Weeks    Status  On-going    Target Date  12/10/17      OT LONG TERM GOAL #6   Title  Pt. will increase left shoulder ROM to be able to independently retrieve items from the cabinetry/colsets.    Baseline  09/27/2017: Pt. sitting AROM shoulder flexion: 108, abduction: 82, supine shoulder flexion: 125    Time  12    Period  Weeks    Status  New    Target Date  12/10/17  Plan - 09/23/17 1747    Clinical Impression Statement  Pt. reports that her bilateral shoulder pain is improving overall but her pain on R side of face is worse and she is seeing her doctor again soon to address it.  She was covering her mouth when talking today stating that it hurt a lot to talk. Pt. continues to progress with LUE ROM and able to participate in more activiites involving her  shoulders.  Shoulder flexion in sititng 110 at the beginning of the session, improved to 130 after treatment. Abduction in sitting is 90.      Occupational Profile and client history currently impacting functional performance  Pt. 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    Occupational performance deficits (Please refer to evaluation for details):  ADL's;IADL's    Rehab Potential  Good    Current Impairments/barriers affecting progress:  Positive indicators: age, family support, motivation, Negative indicators: multiple comorbidities.    OT Frequency  2x / week    OT Duration  12 weeks    OT Treatment/Interventions  Self-care/ADL training;Neuromuscular education;Therapeutic activities;Cognitive remediation/compensation;Passive range of motion;DME and/or AE instruction;Patient/family education;Energy conservation;Therapeutic exercise;Manual Therapy    Clinical Decision Making  Several treatment options, min-mod task modification necessary    Consulted and Agree with Plan of Care  Patient       Patient will benefit from skilled therapeutic intervention in order to improve the following deficits and impairments:  Pain, Impaired UE functional use, Decreased knowledge of precautions, Decreased cognition, Impaired tone, Decreased strength, Decreased endurance, Decreased activity tolerance, Decreased knowledge of use of DME, Decreased balance  Visit Diagnosis: Muscle weakness (generalized)  Other lack of coordination    Problem List There are no active problems to display for this patient.   Susanne Borders, OTR/L ascom [phone] 09/23/17, 5:52 PM  [place] [facility] MAIN [facility] SERVICES [address] [place], [place], [address] Phone: [phone]   Fax:  [phone]  Name: MARILI VADER MRN: [id number] Date of Birth: [birth year]

## 2017-09-23 NOTE — Therapy (Signed)
[place] MAIN [facility] SERVICES [address] [place], [place], [address] Phone: [phone]   Fax:  [phone]  Physical Therapy Treatment  Physical Therapy Progress Note   Dates of reporting period  08/07/17   to   09/23/17  Patient Details  Name: Haley Lucas MRN: [id number] Date of Birth: 1950/05/29 Referring Provider: [name]    Encounter Date: 09/23/2017  PT End of Session - 09/23/17 1321    Visit Number  20    Number of Visits  33    Date for PT Re-Evaluation  10/16/17    PT Start Time  0105    PT Stop Time  0145    PT Time Calculation (min)  40 min    Equipment Utilized During Treatment  Gait belt    Activity Tolerance  Patient tolerated treatment well    Behavior During Therapy  [facility] for tasks assessed/performed       Past Medical History:  Diagnosis Date  . High cholesterol   . Hypertension   . Thyroid disease     History reviewed. No pertinent surgical history.  There were no vitals filed for this visit.  Subjective Assessment - 09/23/17 1320    Subjective  Patient reports that she is not able to lie on her left side. She continues to have shoulder pain.    Pertinent History  She had a hughes flap to her eye right eye feb [facility]th [facility]. She was in the hospital for a month from the first surgey and was discharged Dec 11th. She had PT and OT for  2 weeks and then HHPT PT, OT and ST.  Tha ended christmas. She was not using a Assistive devie prior to surgery. She has weakness in her arms and she is not able to walk her normal distances.     Limitations  Standing    How long can you stand comfortably?  10 mins    Patient Stated Goals  to be able to walk and stand for longer periods of time, and improve balance.     Currently in Pain?  Yes    Pain Score  6     Pain Location  Shoulder    Pain Orientation  Right;Left    Pain Descriptors / Indicators  Aching    Pain Onset  More than a month ago    Pain Frequency   Constant    Aggravating Factors   lying on side    Pain Relieving Factors  nothing    Effect of Pain on Daily Activities  difficult to carry items    Pain Onset  More than a month ago      Therapeutic activities:  Outcome measures performed with progress made towards goals and goals were reviewed with patient .  Neuromuscular training: 1/2 foam flat side down tapping disk; cues for keeping balance and using ankle and hips to maintain center of gravity  1/2 foam flat side down , catching ball; cues for keeping balance and using ankle and hips to maintain center of gravity  1/2 foam flat side down trunk rotation  standing on level surfaces ; cues to maintain balance and posture correction  1/2 foam flat side and head turns , cues to try not to use UE and to keep correct head position  Tandem walking in parallel bars x 10 feet x 3 with cues to look up  Tilt board fwd/bwd, side to side left and right;  cues for posture correction  TM walking side stepping left and right x . 4 miles / hour; cues for posture corretion  Patient needs occasional verbal cueing to improve posture and cueing to correctly perform exercises slowly, holding at end of range to increase motor firing of desired muscle to encourage fatigue.                       PT Education - 09/23/17 1321    Education provided  Yes    Education Details  HEP    Person(s) Educated  Patient    Methods  Explanation    Comprehension  Verbalized understanding       PT Short Term Goals - 09/23/17 1322      PT SHORT TERM GOAL #1   Title  Patient will be independent in home exercise program to improve strength/mobility for better functional independence with ADLs.    Baseline  Patient is doing well with her exercises    Time  4    Period  Weeks    Status  Achieved      PT SHORT TERM GOAL #2   Title  Patient (> 66 years old) will complete five times sit to stand test in < 15 seconds indicating an increased LE  strength and improved balance.    Baseline  22.19 sec 07/31/17; 08/21/17: 19.5s    Time  4    Period  Weeks    Status  On-going        PT Long Term Goals - 09/23/17 1322      PT LONG TERM GOAL #1   Title  Patient will increase six minute walk test distance to >1000 for progression to community ambulator and improve gait ability    Baseline  07/31/17 1085 feet; 08/21/17: 1005'    Time  8    Period  Weeks    Status  Achieved      PT LONG TERM GOAL #2   Title  Patient will increase BLE gross strength to 4+/5 as to improve functional strength for independent gait, increased standing tolerance and increased ADL ability.    Baseline  3+/5 BLE hips, 08/21/17: hip flex/ext/abd/add 4/5, hip IR/ER 4+/5, Knee ext: 5/5, Knee flex: 4/4, 09/23/17 knee flex 4+/5, ext 5/5    Time  8    Period  Weeks    Status  Achieved    Target Date  09/23/17      PT LONG TERM GOAL #3   Title  Patient will ascend/descend 4 stairs without rail assist independently without loss of balance to improve ability to get in/out of home.     Baseline  Definite need of railings and slow and guarded; 08/21/17: Need for rail, decreased speed, guarded, 09/23/17 Need for rail, decreased speed, guarded    Time  8    Period  Weeks    Status  Partially Met    Target Date  10/16/17      PT LONG TERM GOAL #4   Title  Patient will reduce timed up and go to <11 seconds to reduce fall risk and demonstrate improved transfer/gait ability.    Baseline  19.87 sec 07/31/17; 08/21/17: 11.8 sec, 09/23/17=12.79 sec    Time  8    Period  Weeks    Status  Partially Met    Target Date  10/16/17            Plan - 09/23/17 1321    Clinical  Impression Statement  Patient's condition has the potential to improve in response to therapy. Maximum improvement is yet to be obtained. The anticipated improvement is attainable and reasonable in a generally predictable time. Start date of reporting period 08/07/17 end date of reporting period  09/23/17 . Patient  reports that she feels stronger and not so unsteady .  Continuous verbal cues and tactile cues needed to correct form with exercises and for posture correction. Patient required min verbal cues to perform exercises for posture and control and required verbal and tactile cues during all activities. Patient is able to perform strengthening exercises with reports of pain. Patient will benefit from further skilled therapy to return to prior level of function.    Rehab Potential  Good    PT Frequency  2x / week    PT Duration  8 weeks    PT Treatment/Interventions  Gait training;Therapeutic exercise;Therapeutic activities;Stair training;Balance training;Neuromuscular re-education;Patient/family education;Manual techniques;Aquatic Therapy;Moist Heat;Electrical Stimulation    PT Next Visit Plan  balance and therapeutic exercise for hip weakness    Consulted and Agree with Plan of Care  Patient;Family member/caregiver       Patient will benefit from skilled therapeutic intervention in order to improve the following deficits and impairments:  Abnormal gait, Decreased balance, Decreased endurance, Decreased mobility, Difficulty walking, Decreased knowledge of precautions, Decreased activity tolerance, Decreased coordination, Decreased safety awareness, Decreased strength, Impaired flexibility, Postural dysfunction  Visit Diagnosis: Muscle weakness (generalized)  Other lack of coordination  Unsteadiness on feet  Low vision, both eyes  Difficulty in walking, not elsewhere classified     Problem List There are no active problems to display for this patient.   [address], [place] DPT 09/23/2017, 1:45 PM  [place] MAIN [facility] SERVICES [address] [place], [place], [address] Phone: [phone]   Fax:  [phone]  Name: KENDALYN CRANFIELD MRN: [id number] Date of Birth: 07/09/1950

## 2017-09-25 ENCOUNTER — Ambulatory Visit: Payer: Medicare Other | Admitting: Physical Therapy

## 2017-09-25 ENCOUNTER — Encounter: Payer: Self-pay | Admitting: Physical Therapy

## 2017-09-25 DIAGNOSIS — R2681 Unsteadiness on feet: Secondary | ICD-10-CM

## 2017-09-25 DIAGNOSIS — R278 Other lack of coordination: Secondary | ICD-10-CM

## 2017-09-25 DIAGNOSIS — H543 Unqualified visual loss, both eyes: Secondary | ICD-10-CM

## 2017-09-25 DIAGNOSIS — M6281 Muscle weakness (generalized): Secondary | ICD-10-CM | POA: Diagnosis not present

## 2017-09-25 DIAGNOSIS — R262 Difficulty in walking, not elsewhere classified: Secondary | ICD-10-CM

## 2017-09-25 NOTE — Therapy (Signed)
[place] MAIN [facility] SERVICES [address] [place], [place], [address] Phone: [phone]   Fax:  [phone]  Physical Therapy Treatment  Patient Details  Name: Haley Lucas MRN: [id number] Date of Birth: 01/25/1951 Referring Provider: [name]    Encounter Date: 09/25/2017  PT End of Session - 09/25/17 1317    Visit Number  21    Number of Visits  33    Date for PT Re-Evaluation  10/16/17    PT Start Time  0102    PT Stop Time  0145    PT Time Calculation (min)  43 min    Equipment Utilized During Treatment  Gait belt    Activity Tolerance  Patient tolerated treatment well    Behavior During Therapy  [facility] for tasks assessed/performed       Past Medical History:  Diagnosis Date  . High cholesterol   . Hypertension   . Thyroid disease     History reviewed. No pertinent surgical history.  There were no vitals filed for this visit.  Subjective Assessment - 09/25/17 1316    Subjective  Patient reports that she is not able to lie on her left side. She continues to have shoulder pain.    Pertinent History  She had a hughes flap to her eye right eye feb 18th at [facility]. She was in the hospital for a month from the first surgey and was discharged Dec 11th. She had PT and OT for  2 weeks and then HHPT PT, OT and ST.  Tha ended christmas. She was not using a Assistive devie prior to surgery. She has weakness in her arms and she is not able to walk her normal distances.     Limitations  Standing    How long can you stand comfortably?  10 mins    Patient Stated Goals  to be able to walk and stand for longer periods of time, and improve balance.     Currently in Pain?  Yes    Pain Score  6     Pain Location  Arm    Pain Orientation  Right;Left    Pain Descriptors / Indicators  Aching    Pain Type  Chronic pain    Pain Radiating Towards  na    Pain Onset  More than a month ago    Aggravating Factors   sleeping and rolling    Pain  Relieving Factors  one    Effect of Pain on Daily Activities  n    Pain Onset  In the past 7 days         Treatment: Quantum double leg press 90 x 20 x 3 ;;cues to not snap knees Heel raises with UE support , 2 x 10;cues for not flexing trunk fwd  Side stepping on blue balance foam x 10 laps RTB side stepping in // bars 4 lengths x 2; Eccentric step downs x 10 BLE, cues to not put weight on hands Squats x 10 with 5 sec hold, cues for correct posture Resisted side-steeping RTB 4 lengths x 2;cues to not rotate pelivs Standing mini squats 2 x 10 with RTB around knees to encourage abduction;cues to hold for 3 seconds Step-ups to 6" step x 10 bilateral;; cues for head position 1/2 foam flat side up and balance with head turns left and right feet apart and feet together,; cues for keeping balance and using ankle and hips to maintain center of  gravity tandem standing on 1/2 foam and tapping disk fwd, disk side stepping tap   ; cues to maintain balance and posture correction                      PT Education - 09/25/17 1316    Education provided  Yes    Education Details  HEP    Person(s) Educated  Patient    Methods  Explanation;Demonstration;Tactile cues;Verbal cues    Comprehension  Verbalized understanding;Returned demonstration;Verbal cues required       PT Short Term Goals - 09/23/17 1322      PT SHORT TERM GOAL #1   Title  Patient will be independent in home exercise program to improve strength/mobility for better functional independence with ADLs.    Baseline  Patient is doing well with her exercises    Time  4    Period  Weeks    Status  Achieved      PT SHORT TERM GOAL #2   Title  Patient (> 89 years old) will complete five times sit to stand test in < 15 seconds indicating an increased LE strength and improved balance.    Baseline  22.19 sec 07/31/17; 08/21/17: 19.5s    Time  4    Period  Weeks    Status  On-going        PT Long Term Goals -  09/23/17 1322      PT LONG TERM GOAL #1   Title  Patient will increase six minute walk test distance to >1000 for progression to community ambulator and improve gait ability    Baseline  07/31/17 1085 feet; 08/21/17: 1005'    Time  8    Period  Weeks    Status  Achieved      PT LONG TERM GOAL #2   Title  Patient will increase BLE gross strength to 4+/5 as to improve functional strength for independent gait, increased standing tolerance and increased ADL ability.    Baseline  3+/5 BLE hips, 08/21/17: hip flex/ext/abd/add 4/5, hip IR/ER 4+/5, Knee ext: 5/5, Knee flex: 4/4, 09/23/17 knee flex 4+/5, ext 5/5    Time  8    Period  Weeks    Status  Achieved    Target Date  09/23/17      PT LONG TERM GOAL #3   Title  Patient will ascend/descend 4 stairs without rail assist independently without loss of balance to improve ability to get in/out of home.     Baseline  Definite need of railings and slow and guarded; 08/21/17: Need for rail, decreased speed, guarded, 09/23/17 Need for rail, decreased speed, guarded    Time  8    Period  Weeks    Status  Partially Met    Target Date  10/16/17      PT LONG TERM GOAL #4   Title  Patient will reduce timed up and go to <11 seconds to reduce fall risk and demonstrate improved transfer/gait ability.    Baseline  19.87 sec 07/31/17; 08/21/17: 11.8 sec, 09/23/17=12.79 sec    Time  8    Period  Weeks    Status  Partially Met    Target Date  10/16/17            Plan - 09/25/17 1318    Clinical Impression Statement   Pt was able to perform all exercises today without increased fatigue or symptoms of pain or discomfort. Pt was able to  perform all balance and strength exercises, demonstrating improvements in LE strength and stability.  Pt was able to complete dynamic balance exercises, showing ability to stand on uneven surfaces without LOB and improve postural reactions to correct self during activities.  Pt requires verbal, visual and tactile cues during exercise  in order to complete tasks with proper form and technique. Pt would continue to benefit from skilled PT services in order to further strengthen LE's, improve static and dynamic balance, and improve coordination in order to increase functional mobility and decrease risk of falls    Rehab Potential  Good    PT Frequency  2x / week    PT Duration  8 weeks    PT Treatment/Interventions  Gait training;Therapeutic exercise;Therapeutic activities;Stair training;Balance training;Neuromuscular re-education;Patient/family education;Manual techniques;Aquatic Therapy;Moist Heat;Electrical Stimulation    PT Next Visit Plan  balance and therapeutic exercise for hip weakness    Consulted and Agree with Plan of Care  Patient;Family member/caregiver       Patient will benefit from skilled therapeutic intervention in order to improve the following deficits and impairments:  Abnormal gait, Decreased balance, Decreased endurance, Decreased mobility, Difficulty walking, Decreased knowledge of precautions, Decreased activity tolerance, Decreased coordination, Decreased safety awareness, Decreased strength, Impaired flexibility, Postural dysfunction  Visit Diagnosis: Muscle weakness (generalized)  Other lack of coordination  Unsteadiness on feet  Low vision, both eyes  Difficulty in walking, not elsewhere classified     Problem List There are no active problems to display for this patient.   [address] [place] DPT 09/25/2017, 1:19 PM  [place] MAIN [facility] SERVICES [address] [place], [place], [address] Phone: [phone]   Fax:  [phone]  Name: ANIS CINELLI MRN: [id number] Date of Birth: 1950/08/31

## 2017-09-30 ENCOUNTER — Ambulatory Visit: Payer: Medicare Other | Admitting: Physical Therapy

## 2017-10-02 ENCOUNTER — Encounter: Payer: Self-pay | Admitting: Physical Therapy

## 2017-10-02 ENCOUNTER — Ambulatory Visit: Payer: Medicare Other | Admitting: Physical Therapy

## 2017-10-02 ENCOUNTER — Encounter: Payer: Self-pay | Admitting: Occupational Therapy

## 2017-10-02 ENCOUNTER — Ambulatory Visit: Payer: Medicare Other | Admitting: Occupational Therapy

## 2017-10-02 DIAGNOSIS — R278 Other lack of coordination: Secondary | ICD-10-CM

## 2017-10-02 DIAGNOSIS — M6281 Muscle weakness (generalized): Secondary | ICD-10-CM | POA: Diagnosis not present

## 2017-10-02 DIAGNOSIS — R262 Difficulty in walking, not elsewhere classified: Secondary | ICD-10-CM

## 2017-10-02 DIAGNOSIS — R2681 Unsteadiness on feet: Secondary | ICD-10-CM

## 2017-10-02 DIAGNOSIS — H543 Unqualified visual loss, both eyes: Secondary | ICD-10-CM

## 2017-10-02 NOTE — Therapy (Signed)
[place] MAIN [facility] SERVICES [address] [place], [place], [address] Phone: [phone]   Fax:  [phone]  Physical Therapy Treatment  Patient Details  Name: Haley Lucas MRN: [id number] Date of Birth: 28-Jul-1950 Referring Provider: [name]    Encounter Date: 10/02/2017  PT End of Session - 10/02/17 1345    Visit Number  22    Number of Visits  33    Date for PT Re-Evaluation  10/16/17    PT Start Time  0145    PT Stop Time  0230    PT Time Calculation (min)  45 min    Equipment Utilized During Treatment  Gait belt    Activity Tolerance  Patient tolerated treatment well    Behavior During Therapy  [facility] for tasks assessed/performed       Past Medical History:  Diagnosis Date  . High cholesterol   . Hypertension   . Thyroid disease     History reviewed. No pertinent surgical history.  There were no vitals filed for this visit.  Subjective Assessment - 10/02/17 1345    Subjective  Patient reports that she is not able to lie on her left side. She continues to have shoulder pain.    Pertinent History  She had a hughes flap to her eye right eye feb 18th at [facility]. She was in the hospital for a month from the first surgey and was discharged Dec 11th. She had PT and OT for  2 weeks and then HHPT PT, OT and ST.  Tha ended christmas. She was not using a Assistive devie prior to surgery. She has weakness in her arms and she is not able to walk her normal distances.     Limitations  Standing    How long can you stand comfortably?  10 mins    Patient Stated Goals  to be able to walk and stand for longer periods of time, and improve balance.     Currently in Pain?  Yes    Pain Score  9     Pain Location  Face    Pain Orientation  Right    Pain Descriptors / Indicators  Aching    Pain Type  Chronic pain    Pain Onset  More than a month ago    Pain Frequency  Constant    Pain Relieving Factors  nne    Multiple Pain Sites  -- B  shoulders 7/10    Pain Onset  In the past 7 days        Therapeutic exercise and neuromuscular training: [employer] x 5 mins level 5 1/2 foam flat side up and balance with head turns left and right feet apart and feet together,cues for better posture Side stepping on blue balance beam left and right x 10 lengths, cues for going slowly and she needs UE support with LOB backwards  tandem standing on 1/2 foam  , cues for better posture  step ups from floor to 6 inch stool x 20 bilateral marching in parallel bars x 20, cues for bigger steps Tilt board fwd/bwd, side to side left and right, needs UE support , with min A for balance with mod VCs to slow down return and improve weight shift for better balance; Stepping over hurdle left and right and fwd/bwd, needs UE support with LOB backwards Bosu ball lunge BLE x 10 , cues to bend her knee and keep shoulders straight leg press with  90 lbs x 20 x 3 sets Single leg bridges x 10  SLR with 2 lbs x 10 BLE Bosu ball lunge BLE x 10   Patient required verbal cueing and CGA during all dynamic balance activities. Patient improved greatly in her sit to stand performance with only minimal loss of balance.                      PT Education - 10/02/17 1339    Education provided  Yes    Education Details  hep    Person(s) Educated  Patient;Spouse    Methods  Explanation;Demonstration;Tactile cues    Comprehension  Verbalized understanding;Returned demonstration       PT Short Term Goals - 09/23/17 1322      PT SHORT TERM GOAL #1   Title  Patient will be independent in home exercise program to improve strength/mobility for better functional independence with ADLs.    Baseline  Patient is doing well with her exercises    Time  4    Period  Weeks    Status  Achieved      PT SHORT TERM GOAL #2   Title  Patient (> 66 years old) will complete five times sit to stand test in < 15 seconds indicating an increased LE strength and  improved balance.    Baseline  22.19 sec 07/31/17; 08/21/17: 19.5s    Time  4    Period  Weeks    Status  On-going        PT Long Term Goals - 09/23/17 1322      PT LONG TERM GOAL #1   Title  Patient will increase six minute walk test distance to >1000 for progression to community ambulator and improve gait ability    Baseline  07/31/17 1085 feet; 08/21/17: 1005'    Time  8    Period  Weeks    Status  Achieved      PT LONG TERM GOAL #2   Title  Patient will increase BLE gross strength to 4+/5 as to improve functional strength for independent gait, increased standing tolerance and increased ADL ability.    Baseline  3+/5 BLE hips, 08/21/17: hip flex/ext/abd/add 4/5, hip IR/ER 4+/5, Knee ext: 5/5, Knee flex: 4/4, 09/23/17 knee flex 4+/5, ext 5/5    Time  8    Period  Weeks    Status  Achieved    Target Date  09/23/17      PT LONG TERM GOAL #3   Title  Patient will ascend/descend 4 stairs without rail assist independently without loss of balance to improve ability to get in/out of home.     Baseline  Definite need of railings and slow and guarded; 08/21/17: Need for rail, decreased speed, guarded, 09/23/17 Need for rail, decreased speed, guarded    Time  8    Period  Weeks    Status  Partially Met    Target Date  10/16/17      PT LONG TERM GOAL #4   Title  Patient will reduce timed up and go to <11 seconds to reduce fall risk and demonstrate improved transfer/gait ability.    Baseline  19.87 sec 07/31/17; 08/21/17: 11.8 sec, 09/23/17=12.79 sec    Time  8    Period  Weeks    Status  Partially Met    Target Date  10/16/17              Patient will benefit from  skilled therapeutic intervention in order to improve the following deficits and impairments:     Visit Diagnosis: Other lack of coordination  Muscle weakness (generalized)  Unsteadiness on feet  Difficulty in walking, not elsewhere classified  Low vision, both eyes     Problem List There are no active problems to  display for this patient.   [address] [place], [place] DPT 10/02/2017, 2:07 PM  [place] MAIN [facility] SERVICES [address] [place], [place], [address] Phone: [phone]   Fax:  [phone]  Name: Haley Lucas MRN: [id number] Date of Birth: 04/05/1951

## 2017-10-03 NOTE — Therapy (Signed)
[place] [facility] MAIN [facility] SERVICES [address] [place], [place], [address] Phone: [phone]   Fax:  [phone]  Occupational Therapy Treatment  Patient Details  Name: Haley Lucas MRN: [id number] Date of Birth: [birth year] Referring Provider: Marshell Garfinkel    Encounter Date: 10/02/2017  OT End of Session - 10/03/17 1921    Visit Number  19    Number of Visits  24    Date for OT Re-Evaluation  12/10/17    Authorization Type  visit 9 of 10 for progress report period starting 08/21/2017    OT Start Time  1302    OT Stop Time  1345    OT Time Calculation (min)  43 min    Activity Tolerance  Patient tolerated treatment well    Behavior During Therapy  [facility] for tasks assessed/performed       Past Medical History:  Diagnosis Date  . High cholesterol   . Hypertension   . Thyroid disease     History reviewed. No pertinent surgical history.  There were no vitals filed for this visit.  Subjective Assessment - 10/03/17 1919    Subjective   Patient reports she is doing ok, pain in left shoulder with movement.      Pertinent History  Pt is a 67 y.o. 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 Pt. received inpatient rehabilitation services followed by home health services. Pt. had surgery to repair a right drooping eyelid on February 19th., 2019. Pt. is ready for outpatient OT services.    Patient Stated Goals  Be independent    Currently in Pain?  Yes    Pain Score  9     Pain Location  Face    Pain Orientation  Right    Pain Descriptors / Indicators  Aching    Pain Onset  More than a month ago    Pain Frequency  Constant                   OT Treatments/Exercises (OP) - 10/03/17 0001      Neurological Re-education Exercises   Other Exercises 1  Patient seen for 1# dowel exercises for BUE ROM and strengthening for shoulder flexion, ABD/ADD, forwards and backwards circles, chest press and diagonal patterns  for 10 reps for 2 sets each.  Saebo tower with LUE for 3 levels, unable to perform top level.      Other Exercises 2  FMC with washers and dowels with right and left hand , reaching multi directions to encourage reach.  Patient seen for manipulation of small snap beads with use of BUE to push together and pull apart to work on finger strengthening and coordination, cues for how to complete.  Moderate resistance to place pieces together. Knotting and unknotting exercise with bilateral hand use, occasional cues required, patient working on speed and dexterity of finger movements.             OT Education - 10/03/17 1921    Education provided  Yes    Education Details  FMC, ROM/strengthening exercises    Person(s) Educated  Patient    Methods  Explanation;Demonstration;Tactile cues;Verbal cues    Comprehension  Verbalized understanding;Returned demonstration          OT Long Term Goals - 09/17/17 1332      OT LONG TERM GOAL #1   Title  Pt. will increase UE strength by 2 mm grades to assist  with ADLs, and IADL    Baseline  09/17/2017: Pt, is progressing with ROM, strengthening N/A secondary to bilateral shoulder pain    Time  12    Period  Weeks    Status  Deferred    Target Date  12/10/17      OT LONG TERM GOAL #2   Title  Pt. will improve right hand [facility] skills by 3 sec. to be able to manipulate ADL items.    Baseline  09/17/2017:  Right: 21 sec., Left 25 sec.    Time  12    Period  Weeks    Status  On-going    Target Date  12/10/17      OT LONG TERM GOAL #3   Title  Pt. will demonstrate visual compensatory strategies 100% of the time during ADLs, and IADLs.    Baseline  09/17/2017: Pt. is improving    Time  12    Period  Weeks    Status  On-going    Target Date  12/10/17      OT LONG TERM GOAL #4   Title  Pt. will demonstrate cognitive compensatory strategies 100% of the time during ADLs, and IADLs.    Baseline  Pt. cognitive staus is back to baseline    Time  12     Period  Weeks    Status  Achieved      OT LONG TERM GOAL #5   Title  Pt. will complete IADL, home management tasks with Supervision.     Baseline  09/17/2017: MinA    Time  12    Period  Weeks    Status  On-going    Target Date  12/10/17      OT LONG TERM GOAL #6   Title  Pt. will increase left shoulder ROM to be able to independently retrieve items from the cabinetry/colsets.    Baseline  09/27/2017: Pt. sitting AROM shoulder flexion: 108, abduction: 82, supine shoulder flexion: 125    Time  12    Period  Weeks    Status  New    Target Date  12/10/17            Plan - 10/03/17 1922    Clinical Impression Statement  Patient progressing with shoulder ROM, reaching tasks and fine motor coordination skills, she still has facial pain and is distracted at times by pain.  She continues to benefit from skilled OT to maximize safety and independence in daily tasks, continue to work towards goals.     Occupational Profile and client history currently impacting functional performance  Pt. 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    Occupational performance deficits (Please refer to evaluation for details):  ADL's;IADL's    Rehab Potential  Good    Current Impairments/barriers affecting progress:  Positive indicators: age, family support, motivation, Negative indicators: multiple comorbidities.    OT Frequency  2x / week    OT Duration  12 weeks    OT Treatment/Interventions  Self-care/ADL training;Neuromuscular education;Therapeutic activities;Cognitive remediation/compensation;Passive range of motion;DME and/or AE instruction;Patient/family education;Energy conservation;Therapeutic exercise;Manual Therapy    Consulted and Agree with Plan of Care  Patient       Patient will benefit from skilled therapeutic intervention in order to improve the following deficits and impairments:  Pain, Impaired UE functional use, Decreased knowledge of precautions, Decreased  cognition, Impaired tone, Decreased strength, Decreased endurance, Decreased activity tolerance, Decreased knowledge of use of DME, Decreased balance  Visit Diagnosis: Muscle weakness (generalized)  Other lack of coordination    Problem List There are no active problems to display for this patient.  Kerrie Buffalomy T Tionne Dayhoff, OTR/L, CLT  Haley Lucas 10/03/2017, 7:24 PM  [place] [facility] MAIN [facility] [address] [place], [place], [address] Phone: [phone]   Fax:  [phone]  Name: Haley LemonsHyun S Florio MRN: [id number] Date of Birth: 02/02/1951

## 2017-10-07 ENCOUNTER — Ambulatory Visit: Payer: Medicare Other | Admitting: Physical Therapy

## 2017-10-08 ENCOUNTER — Ambulatory Visit: Payer: Medicare Other | Admitting: Physical Therapy

## 2017-10-08 ENCOUNTER — Encounter: Payer: Self-pay | Admitting: Physical Therapy

## 2017-10-08 ENCOUNTER — Ambulatory Visit: Payer: Medicare Other | Admitting: Occupational Therapy

## 2017-10-08 DIAGNOSIS — M6281 Muscle weakness (generalized): Secondary | ICD-10-CM

## 2017-10-08 DIAGNOSIS — R278 Other lack of coordination: Secondary | ICD-10-CM

## 2017-10-08 DIAGNOSIS — R262 Difficulty in walking, not elsewhere classified: Secondary | ICD-10-CM

## 2017-10-08 DIAGNOSIS — H543 Unqualified visual loss, both eyes: Secondary | ICD-10-CM

## 2017-10-08 DIAGNOSIS — R2681 Unsteadiness on feet: Secondary | ICD-10-CM

## 2017-10-08 NOTE — Therapy (Signed)
[place] MAIN [facility] SERVICES [address] [place], [place], [address] Phone: [phone]   Fax:  [phone]  Physical Therapy Treatment  Patient Details  Name: Haley Lucas MRN: [id number] Date of Birth: 1950/08/11 Referring Provider: [name]    Encounter Date: 10/08/2017  PT End of Session - 10/08/17 1353    Visit Number  23    Number of Visits  33    Date for PT Re-Evaluation  10/16/17    PT Start Time  0145    PT Stop Time  0230    PT Time Calculation (min)  45 min    Equipment Utilized During Treatment  Gait belt    Activity Tolerance  Patient tolerated treatment well    Behavior During Therapy  [facility] for tasks assessed/performed       Past Medical History:  Diagnosis Date  . High cholesterol   . Hypertension   . Thyroid disease     History reviewed. No pertinent surgical history.  There were no vitals filed for this visit.  Subjective Assessment - 10/08/17 1351    Subjective  Patient reports that she is not able to lie on her left side. She continues to have shoulder pain.    Pertinent History  She had a hughes flap to her eye right eye feb 18th at [facility]. She was in the hospital for a month from the first surgey and was discharged Dec 11th. She had PT and OT for  2 weeks and then HHPT PT, OT and ST.  Tha ended christmas. She was not using a Assistive devie prior to surgery. She has weakness in her arms and she is not able to walk her normal distances.     Limitations  Standing    How long can you stand comfortably?  10 mins    Patient Stated Goals  to be able to walk and stand for longer periods of time, and improve balance.     Currently in Pain?  Yes    Pain Score  7     Pain Location  Shoulder    Pain Orientation  Right;Left    Pain Descriptors / Indicators  Aching    Pain Type  Chronic pain    Pain Onset  More than a month ago    Pain Frequency  Constant    Aggravating Factors   lying down    Pain Relieving  Factors  none    Effect of Pain on Daily Activities  difficult to lie down    Multiple Pain Sites  No    Pain Onset  In the past 7 days      Neuromuscular training:  Toe tapping 6 inch stool without UE assist from foam x 20 , cues for good postureues   Tandem gait in // bars x 4 laps ; CGA use due to increase postural sway but no LOB.  Side stepping on blue foam balance beam x 5 lengths of the parallel bars ; Patient needs occasional verbal cueing and CGA to maintain center of gravity during all dynamic standing balance activities. Patient required UE support for side stepping.  Standing on 1/2 foam with flat side down and head turns x 20 left and right  Standing on foam NBOS sorting balls/ sorting shapes reaching  X 2 mins , cue to look up   Resisted walking: 12.5# forward/backward, side/side (x4 way) x2 laps each direction with CGA for safety and cues to  improve step length for better balance challenge; Required cues to slow down eccentric return for better balance control;    Standing on 1/2 bolster (Flat side up): Heel/toe rock x15 with finger tip hold for balance with cues to keep knee straight for better ankle strategies; Feet in neutral, BUE wand flexion x15 reps with min A for safety and cues to improve weight shift for better stance control; patient was able to utilize ankle strategies well with less loss of balance; Tandem stance on 1/2 bolster 10 sec hold unsupported x2 reps each foot in front with min A for safety and cues to improve upper weight shift and trunk control for better balance in narrow base of support;     Rockerboard:  Forward/backward, side/side teeter x 2 min each with rail assist for safety and cues to improve weight shift for better dynamic balance;    Patient tolerated session well; denies any pain but does report some fatigue at end of session                         PT Education - 10/08/17 1353    Education provided  Yes     Education Details  HEP    Person(s) Educated  Patient    Methods  Explanation    Comprehension  Verbalized understanding       PT Short Term Goals - 09/23/17 1322      PT SHORT TERM GOAL #1   Title  Patient will be independent in home exercise program to improve strength/mobility for better functional independence with ADLs.    Baseline  Patient is doing well with her exercises    Time  4    Period  Weeks    Status  Achieved      PT SHORT TERM GOAL #2   Title  Patient (> 53 years old) will complete five times sit to stand test in < 15 seconds indicating an increased LE strength and improved balance.    Baseline  22.19 sec 07/31/17; 08/21/17: 19.5s    Time  4    Period  Weeks    Status  On-going        PT Long Term Goals - 09/23/17 1322      PT LONG TERM GOAL #1   Title  Patient will increase six minute walk test distance to >1000 for progression to community ambulator and improve gait ability    Baseline  07/31/17 1085 feet; 08/21/17: 1005'    Time  8    Period  Weeks    Status  Achieved      PT LONG TERM GOAL #2   Title  Patient will increase BLE gross strength to 4+/5 as to improve functional strength for independent gait, increased standing tolerance and increased ADL ability.    Baseline  3+/5 BLE hips, 08/21/17: hip flex/ext/abd/add 4/5, hip IR/ER 4+/5, Knee ext: 5/5, Knee flex: 4/4, 09/23/17 knee flex 4+/5, ext 5/5    Time  8    Period  Weeks    Status  Achieved    Target Date  09/23/17      PT LONG TERM GOAL #3   Title  Patient will ascend/descend 4 stairs without rail assist independently without loss of balance to improve ability to get in/out of home.     Baseline  Definite need of railings and slow and guarded; 08/21/17: Need for rail, decreased speed, guarded, 09/23/17 Need for rail, decreased speed, guarded  Time  8    Period  Weeks    Status  Partially Met    Target Date  10/16/17      PT LONG TERM GOAL #4   Title  Patient will reduce timed up and go to <11  seconds to reduce fall risk and demonstrate improved transfer/gait ability.    Baseline  19.87 sec 07/31/17; 08/21/17: 11.8 sec, 09/23/17=12.79 sec    Time  8    Period  Weeks    Status  Partially Met    Target Date  10/16/17            Plan - 10/08/17 1354    Clinical Impression Statement  Dynamic  balance interventions continued today, with a focus on unilateral LE stability.  Pt tolerated all exercises well.  Intermediate dynamic standing balance tasks were progressed with cga needed for  foam and narrow base of support activities.  Pt would continue to benefit from skilled therapy services in order to further address LE strength deficits and balance deficits in order to decrease fall risk and improve mobility    Rehab Potential  Good    PT Frequency  2x / week    PT Duration  8 weeks    PT Treatment/Interventions  Gait training;Therapeutic exercise;Therapeutic activities;Stair training;Balance training;Neuromuscular re-education;Patient/family education;Manual techniques;Aquatic Therapy;Moist Heat;Electrical Stimulation    PT Next Visit Plan  balance and therapeutic exercise for hip weakness    Consulted and Agree with Plan of Care  Patient;Family member/caregiver       Patient will benefit from skilled therapeutic intervention in order to improve the following deficits and impairments:  Abnormal gait, Decreased balance, Decreased endurance, Decreased mobility, Difficulty walking, Decreased knowledge of precautions, Decreased activity tolerance, Decreased coordination, Decreased safety awareness, Decreased strength, Impaired flexibility, Postural dysfunction  Visit Diagnosis: Other lack of coordination  Muscle weakness (generalized)  Unsteadiness on feet  Difficulty in walking, not elsewhere classified  Low vision, both eyes     Problem List There are no active problems to display for this patient.   [address] [place] DPT 10/08/2017, 1:55 PM  [place] MAIN [facility] SERVICES [address] [place], [place], [address] Phone: [phone]   Fax:  [phone]  Name: Haley Lucas MRN: [id number] Date of Birth: 1951/03/05

## 2017-10-08 NOTE — Therapy (Signed)
[facility] MAIN [facility] SERVICES [address] [place], [place], [address] Phone: [phone]   Fax:  [phone]  Occupational Therapy Progress Note  Dates of reporting period  08/21/2017   to   10/08/2017  Patient Details  Name: Haley LemonsHyun S Lucas MRN: [id number] Date of Birth: 06/26/1950 Referring Provider: Marshell GarfinkelO, SARAH J    Encounter Date: 10/08/2017  OT End of Session - 10/08/17 1740    Visit Number  20    Number of Visits  24    Date for OT Re-Evaluation  12/10/17    Authorization Type  visit 10 of 10 for progress report period starting 08/21/2017    OT Start Time  1430    OT Stop Time  1515    OT Time Calculation (min)  45 min    Activity Tolerance  Patient tolerated treatment well    Behavior During Therapy  [facility] for tasks assessed/performed       Past Medical History:  Diagnosis Date  . High cholesterol   . Hypertension   . Thyroid disease     No past surgical history on file.  There were no vitals filed for this visit.  Subjective Assessment - 10/08/17 1738    Subjective   Pt. reports she feels better after therapy, and stretching    Pertinent History  Pt is a 67 y.o. female who [facility]. Pt. received inpatient rehabilitation services followed by home health services. Pt. had surgery to repair a right drooping eyelid on February 19th., 2019. Pt. is ready for outpatient OT services.    Patient Stated Goals  Be independent    Currently in Pain?  Yes    Pain Score  7     Pain Location  Shoulder    Pain Orientation  Right;Left    Pain Descriptors / Indicators  Aching    Pain Type  Chronic pain    Pain Onset  More than a month ago      OT TREATMENT  Therapeutic Exercise:  Pt.worked on AROM in all joint ranges of the LUEin sitting, for scapular elevation, depression, abduction, rotation, retraction, internal, and external rotation supine and sitting.AROM with the left shoulder in  supine for shoulder flexion, abduction, and protraction in supine. 1# dowel in sitting for shoulder flexion, chest press, and circular motion. Pt. Worked on AAROM atthe wall,Biliateral UE ROM.Pt. worked on reaching with the LUE using the finger ladder.                              OT Education - 10/08/17 1739    Education provided  Yes    Education Details  ROM exercises    Person(s) Educated  Patient    Methods  Explanation;Demonstration;Tactile cues;Verbal cues          OT Long Term Goals - 10/08/17 1744      OT LONG TERM GOAL #1   Title  Pt. will increase UE strength by 2 mm grades to assist with ADLs, and IADL    Baseline  10/08/2017: Pt. is progressing with ROM, strengthening N/A secondary to bilateral shoulder pain    Time  12    Period  Weeks    Status  On-going    Target Date  12/10/17      OT LONG TERM GOAL #2   Title  Pt. will improve right hand [facility] skills  by 3 sec. to be able to manipulate ADL items.    Baseline  10/08/2017:  Right: 21 sec., Left 25 sec.    Time  12    Period  Weeks    Status  On-going    Target Date  12/10/17      OT LONG TERM GOAL #3   Title  Pt. will demonstrate visual compensatory strategies 100% of the time during ADLs, and IADLs.    Baseline  09/17/2017: Pt. is improving    Time  12    Period  Weeks    Status  On-going    Target Date  12/10/17      OT LONG TERM GOAL #5   Title  Pt. will complete IADL, home management tasks with Supervision.     Baseline  10/08/2017: MinA    Time  12    Period  Weeks    Status  On-going    Target Date  12/10/17      OT LONG TERM GOAL #6   Title  Pt. will increase left shoulder ROM to be able to independently retrieve items from the cabinetry/colsets.    Baseline  10/08/2017: Pt. sitting AROM shoulder flexion: 108, abduction: 82, supine shoulder flexion: 125    Time  12    Period  Weeks    Status  On-going    Target Date  12/10/17            Plan - 10/08/17 1740     Clinical Impression Statement  Pt. continues to respond well to treatment with increased ROM, less pain, and less tightness overall. Pt. continues to present with limited UE strength, and coordination.  Pt. continues to work on improving UE functioning for improved ADL, and IADL tasks.    Occupational Profile and client history currently impacting functional performance  Pt. 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    Occupational performance deficits (Please refer to evaluation for details):  ADL's;IADL's    Rehab Potential  Good    Current Impairments/barriers affecting progress:  Positive indicators: age, family support, motivation, Negative indicators: multiple comorbidities.    OT Frequency  2x / week    OT Duration  12 weeks    OT Treatment/Interventions  Self-care/ADL training;Neuromuscular education;Therapeutic activities;Cognitive remediation/compensation;Passive range of motion;DME and/or AE instruction;Patient/family education;Energy conservation;Therapeutic exercise;Manual Therapy    Clinical Decision Making  Several treatment options, min-mod task modification necessary    Consulted and Agree with Plan of Care  Patient       Patient will benefit from skilled therapeutic intervention in order to improve the following deficits and impairments:  Pain, Impaired UE functional use, Decreased knowledge of precautions, Decreased cognition, Impaired tone, Decreased strength, Decreased endurance, Decreased activity tolerance, Decreased knowledge of use of DME, Decreased balance  Visit Diagnosis: Muscle weakness (generalized)  Other lack of coordination    Problem List There are no active problems to display for this patient.   Haley Messier, MS, OTR/L 10/08/2017, 5:49 PM  [place] [facility] MAIN [facility] SERVICES [address] [place], [place], [address] Phone: [phone]   Fax:  [phone]  Name: Haley Lucas MRN:  [id number] Date of Birth: July 04, 1950

## 2017-10-10 ENCOUNTER — Ambulatory Visit: Payer: Medicare Other | Admitting: Physical Therapy

## 2017-10-10 ENCOUNTER — Encounter: Payer: Self-pay | Admitting: Physical Therapy

## 2017-10-10 DIAGNOSIS — M6281 Muscle weakness (generalized): Secondary | ICD-10-CM | POA: Diagnosis not present

## 2017-10-10 DIAGNOSIS — H543 Unqualified visual loss, both eyes: Secondary | ICD-10-CM

## 2017-10-10 DIAGNOSIS — R2681 Unsteadiness on feet: Secondary | ICD-10-CM

## 2017-10-10 DIAGNOSIS — R278 Other lack of coordination: Secondary | ICD-10-CM

## 2017-10-10 DIAGNOSIS — R262 Difficulty in walking, not elsewhere classified: Secondary | ICD-10-CM

## 2017-10-10 NOTE — Therapy (Signed)
[place] MAIN [facility] SERVICES [address] [place], [place], [address] Phone: [phone]   Fax:  [phone]  Physical Therapy Treatment  Patient Details  Name: Haley Lucas MRN: [id number] Date of Birth: Jul 23, 1950 Referring Provider: [name]    Encounter Date: 10/10/2017  PT End of Session - 10/10/17 1330    Visit Number  24    Number of Visits  33    Date for PT Re-Evaluation  10/16/17    PT Start Time  0100    PT Stop Time  0145    PT Time Calculation (min)  45 min    Equipment Utilized During Treatment  Gait belt    Activity Tolerance  Patient tolerated treatment well    Behavior During Therapy  [facility] for tasks assessed/performed       Past Medical History:  Diagnosis Date  . High cholesterol   . Hypertension   . Thyroid disease     History reviewed. No pertinent surgical history.  There were no vitals filed for this visit.  Subjective Assessment - 10/10/17 1329    Subjective  Patient reports that she is not able to lie on her left side. She continues to have shoulder pain. She reports that her balance is a little bit better     Pertinent History  She had a hughes flap to her eye right [facility]. She was in the hospital for a month from the first surgey and was discharged Dec 11th. She had PT and OT for  2 weeks and then HHPT PT, OT and ST.  Tha ended christmas. She was not using a Assistive devie prior to surgery. She has weakness in her arms and she is not able to walk her normal distances.     Limitations  Standing    How long can you stand comfortably?  10 mins    Patient Stated Goals  to be able to walk and stand for longer periods of time, and improve balance.     Currently in Pain?  Yes    Pain Score  7     Pain Location  Shoulder    Pain Orientation  Right;Left    Pain Descriptors / Indicators  Aching    Pain Onset  More than a month ago    Multiple Pain Sites  No    Pain Onset  In the past 7  days       NEUROMUSCULAR RE-EDUCATION Airex with tandem and head turns, trunk turns x 30  each;cues for posture correction  Airex cone  reaching crossing midline cues for looking up   Toe tapping 6 inch stool without UE assist, cues for technique  Tandem gait with balance foam  in // bars x 4 laps , , posture correction cues  Side stepping on blue  foam balance beam x 5 lengths of the parallel bars with posture correction cues  4 square fwd/bwd, side to side stepping/ diagonal stepping, cues to not step on the lines  Matrix fwd/bwd/side to side x 5 22 . 5 lbs and CGA  Leg press x 90 lbs x 20 x 2   Cues for proper technique of exercises, slow eccentric contractions to target specific muscles and facility increased muscle building.    Patient needs occasional verbal cueing and CGA to maintain center of gravity during all dynamic standing balance activities. Patient required UE support for side stepping.  PT Education - 10/10/17 1330    Education provided  Yes    Education Details  HEP    Person(s) Educated  Patient    Methods  Explanation;Demonstration;Tactile cues    Comprehension  Verbalized understanding;Returned demonstration       PT Short Term Goals - 09/23/17 1322      PT SHORT TERM GOAL #1   Title  Patient will be independent in home exercise program to improve strength/mobility for better functional independence with ADLs.    Baseline  Patient is doing well with her exercises    Time  4    Period  Weeks    Status  Achieved      PT SHORT TERM GOAL #2   Title  Patient (> 2 years old) will complete five times sit to stand test in < 15 seconds indicating an increased LE strength and improved balance.    Baseline  22.19 sec 07/31/17; 08/21/17: 19.5s    Time  4    Period  Weeks    Status  On-going        PT Long Term Goals - 09/23/17 1322      PT LONG TERM GOAL #1   Title  Patient will increase six minute walk test distance to  >1000 for progression to community ambulator and improve gait ability    Baseline  07/31/17 1085 feet; 08/21/17: 1005'    Time  8    Period  Weeks    Status  Achieved      PT LONG TERM GOAL #2   Title  Patient will increase BLE gross strength to 4+/5 as to improve functional strength for independent gait, increased standing tolerance and increased ADL ability.    Baseline  3+/5 BLE hips, 08/21/17: hip flex/ext/abd/add 4/5, hip IR/ER 4+/5, Knee ext: 5/5, Knee flex: 4/4, 09/23/17 knee flex 4+/5, ext 5/5    Time  8    Period  Weeks    Status  Achieved    Target Date  09/23/17      PT LONG TERM GOAL #3   Title  Patient will ascend/descend 4 stairs without rail assist independently without loss of balance to improve ability to get in/out of home.     Baseline  Definite need of railings and slow and guarded; 08/21/17: Need for rail, decreased speed, guarded, 09/23/17 Need for rail, decreased speed, guarded    Time  8    Period  Weeks    Status  Partially Met    Target Date  10/16/17      PT LONG TERM GOAL #4   Title  Patient will reduce timed up and go to <11 seconds to reduce fall risk and demonstrate improved transfer/gait ability.    Baseline  19.87 sec 07/31/17; 08/21/17: 11.8 sec, 09/23/17=12.79 sec    Time  8    Period  Weeks    Status  Partially Met    Target Date  10/16/17            Plan - 10/10/17 1330    Clinical Impression Statement  Pt was able to progress dynamic balance exercises today, noting improved postural reactions with LOB and moving outside normal BOS.  Pt was able to perform all exercises on uneven surfaces with minimal LOB noted.  Dynamic balance stability activities were progressed today, with decrease control noted when attempting to perform tasks with narrow bade of support.  Pt would continue to benefit from skilled therapy services to address further  balance impairments and decrease falls risk.    Rehab Potential  Good    PT Frequency  2x / week    PT Duration  8  weeks    PT Treatment/Interventions  Gait training;Therapeutic exercise;Therapeutic activities;Stair training;Balance training;Neuromuscular re-education;Patient/family education;Manual techniques;Aquatic Therapy;Moist Heat;Electrical Stimulation    PT Next Visit Plan  balance and therapeutic exercise for hip weakness    Consulted and Agree with Plan of Care  Patient;Family member/caregiver       Patient will benefit from skilled therapeutic intervention in order to improve the following deficits and impairments:  Abnormal gait, Decreased balance, Decreased endurance, Decreased mobility, Difficulty walking, Decreased knowledge of precautions, Decreased activity tolerance, Decreased coordination, Decreased safety awareness, Decreased strength, Impaired flexibility, Postural dysfunction  Visit Diagnosis: Muscle weakness (generalized)  Other lack of coordination  Unsteadiness on feet  Difficulty in walking, not elsewhere classified  Low vision, both eyes     Problem List There are no active problems to display for this patient.   Alanson Puls 10/10/2017, 1:37 PM  [place] MAIN [facility] SERVICES [address] [place], [place], [address] Phone: [phone]   Fax:  [phone]  Name: Haley Lucas, Haley Lucas DPT MRN: [id number] Date of Birth: 04-11-1951

## 2017-10-15 ENCOUNTER — Encounter: Payer: Self-pay | Admitting: Physical Therapy

## 2017-10-15 ENCOUNTER — Ambulatory Visit: Payer: Medicare Other | Admitting: Occupational Therapy

## 2017-10-15 ENCOUNTER — Ambulatory Visit: Payer: Medicare Other | Attending: Family Medicine | Admitting: Physical Therapy

## 2017-10-15 DIAGNOSIS — M6281 Muscle weakness (generalized): Secondary | ICD-10-CM | POA: Diagnosis present

## 2017-10-15 DIAGNOSIS — H543 Unqualified visual loss, both eyes: Secondary | ICD-10-CM

## 2017-10-15 DIAGNOSIS — R2681 Unsteadiness on feet: Secondary | ICD-10-CM | POA: Diagnosis present

## 2017-10-15 DIAGNOSIS — R278 Other lack of coordination: Secondary | ICD-10-CM | POA: Diagnosis present

## 2017-10-15 DIAGNOSIS — R262 Difficulty in walking, not elsewhere classified: Secondary | ICD-10-CM

## 2017-10-15 NOTE — Therapy (Signed)
[place] MAIN [facility] SERVICES [address] [place], [place], [address] Phone: [phone]   Fax:  [phone]  Physical Therapy Treatment  Patient Details  Name: Haley Lucas MRN: [id number] Date of Birth: 01-17-1951 Referring Provider: [name]    Encounter Date: 10/15/2017  PT End of Session - 10/15/17 1430    Visit Number  25    Number of Visits  33    Date for PT Re-Evaluation  10/16/17    PT Start Time  0100    PT Stop Time  0145    PT Time Calculation (min)  45 min    Equipment Utilized During Treatment  Gait belt    Activity Tolerance  Patient tolerated treatment well    Behavior During Therapy  [facility] for tasks assessed/performed       Past Medical History:  Diagnosis Date  . High cholesterol   . Hypertension   . Thyroid disease     History reviewed. No pertinent surgical history.  There were no vitals filed for this visit.  Subjective Assessment - 10/15/17 1420    Subjective  Patient reports that she is not able to lie on her left side. She continues to have shoulder pain. She reports that her balance is a little bit better     Pertinent History  She had a hughes flap to her eye right eye feb 18th at [facility]. She was in the hospital for a month from the first surgey and was discharged Dec 11th. She had PT and OT for  2 weeks and then HHPT PT, OT and ST.  Tha ended christmas. She was not using a Assistive devie prior to surgery. She has weakness in her arms and she is not able to walk her normal distances.     Limitations  Standing    How long can you stand comfortably?  10 mins    Patient Stated Goals  to be able to walk and stand for longer periods of time, and improve balance.     Currently in Pain?  Yes    Pain Score  7     Pain Location  Shoulder    Pain Orientation  Right;Left    Pain Descriptors / Indicators  Aching    Pain Type  Chronic pain    Pain Onset  More than a month ago    Pain Onset  In the past 7  days       NEUROMUSCULAR RE-EDUCATION  1/2 foam flat side up and balance with head turns left and right feet apart and feet together, x 2 mins   tandem standing on 1/2 foam   flat side down with ball catch, and bounding ball and ball toss x 2 mins    side stepping left and right in parallel bars 10 feet x 3  step ups from floor to 6 inch stool x 20 bilateral with CGA  Tilt board fwd/bwd, side to side left and right   Side stepping with air foam and CGA x 10 x 4 laps  Tandem stand on foam with rotation and ball hold left and right x 10   Matrix 22 . 5 lbs x 3 each direction left and right    Patient needs occasional verbal cueing to improve posture and cueing to correctly perform exercises slowly, holding at end of range to increase motor firing of desired muscle to encourage fatigue.BLE staggered stance anterior/posterior weight shifting on small rockerboard;  PT Education - 10/15/17 1420    Education provided  Yes    Education Details  HEP    Person(s) Educated  Patient    Methods  Explanation    Comprehension  Verbalized understanding       PT Short Term Goals - 09/23/17 1322      PT SHORT TERM GOAL #1   Title  Patient will be independent in home exercise program to improve strength/mobility for better functional independence with ADLs.    Baseline  Patient is doing well with her exercises    Time  4    Period  Weeks    Status  Achieved      PT SHORT TERM GOAL #2   Title  Patient (> 1 years old) will complete five times sit to stand test in < 15 seconds indicating an increased LE strength and improved balance.    Baseline  22.19 sec 07/31/17; 08/21/17: 19.5s    Time  4    Period  Weeks    Status  On-going        PT Long Term Goals - 09/23/17 1322      PT LONG TERM GOAL #1   Title  Patient will increase six minute walk test distance to >1000 for progression to community ambulator and improve gait ability     Baseline  07/31/17 1085 feet; 08/21/17: 1005'    Time  8    Period  Weeks    Status  Achieved      PT LONG TERM GOAL #2   Title  Patient will increase BLE gross strength to 4+/5 as to improve functional strength for independent gait, increased standing tolerance and increased ADL ability.    Baseline  3+/5 BLE hips, 08/21/17: hip flex/ext/abd/add 4/5, hip IR/ER 4+/5, Knee ext: 5/5, Knee flex: 4/4, 09/23/17 knee flex 4+/5, ext 5/5    Time  8    Period  Weeks    Status  Achieved    Target Date  09/23/17      PT LONG TERM GOAL #3   Title  Patient will ascend/descend 4 stairs without rail assist independently without loss of balance to improve ability to get in/out of home.     Baseline  Definite need of railings and slow and guarded; 08/21/17: Need for rail, decreased speed, guarded, 09/23/17 Need for rail, decreased speed, guarded    Time  8    Period  Weeks    Status  Partially Met    Target Date  10/16/17      PT LONG TERM GOAL #4   Title  Patient will reduce timed up and go to <11 seconds to reduce fall risk and demonstrate improved transfer/gait ability.    Baseline  19.87 sec 07/31/17; 08/21/17: 11.8 sec, 09/23/17=12.79 sec    Time  8    Period  Weeks    Status  Partially Met    Target Date  10/16/17            Plan - 10/15/17 1430    Clinical Impression Statement  Patient demonstrates improved progress with outcome measures from last goal update and her balance is improving. Patient did require UE support to perform sidestepping up and over exercise and needs UE support for narrow base of support and uneven surfaces.  Patient will benefit from further skilled therapy to return to prior level of function.  Pt was encouraged to perform HEP during the week in order to continue progressing balance and  strength interventions.  Pt would continue to benefit from skilled therapy services in order to further address LE strength deficits and balance deficits in order to decrease fall risk and  improve mobility.    Rehab Potential  Good    PT Frequency  2x / week    PT Duration  8 weeks    PT Treatment/Interventions  Gait training;Therapeutic exercise;Therapeutic activities;Stair training;Balance training;Neuromuscular re-education;Patient/family education;Manual techniques;Aquatic Therapy;Moist Heat;Electrical Stimulation    PT Next Visit Plan  balance and therapeutic exercise for hip weakness    Consulted and Agree with Plan of Care  Patient;Family member/caregiver       Patient will benefit from skilled therapeutic intervention in order to improve the following deficits and impairments:  Abnormal gait, Decreased balance, Decreased endurance, Decreased mobility, Difficulty walking, Decreased knowledge of precautions, Decreased activity tolerance, Decreased coordination, Decreased safety awareness, Decreased strength, Impaired flexibility, Postural dysfunction  Visit Diagnosis: Muscle weakness (generalized)  Other lack of coordination  Unsteadiness on feet  Difficulty in walking, not elsewhere classified  Low vision, both eyes     Problem List There are no active problems to display for this patient.   [address], [place] DPT 10/15/2017, 2:51 PM  [place] MAIN [facility] SERVICES [address] [place], [place], [address] Phone: [phone]   Fax:  [phone]  Name: Haley Lucas MRN: [id number] Date of Birth: 05/23/1950

## 2017-10-15 NOTE — Therapy (Signed)
[place] [facility] [address] [place], [place], [address] Phone: [phone]   Fax:  [phone]  Occupational Therapy Treatment  Patient Details  Name: Haley LemonsHyun S Woodell MRN: [id number] Date of Birth: 03/05/1951 Referring Provider: Marshell GarfinkelO, SARAH J    Encounter Date: 10/15/2017  OT End of Session - 10/15/17 1417    Visit Number  21    Number of Visits  24    Date for OT Re-Evaluation  12/10/17    Authorization Type  visit 1 of 10 for progress report period starting 10/15/2017    OT Start Time  1345    OT Stop Time  1430    OT Time Calculation (min)  45 min    Activity Tolerance  Patient tolerated treatment well    Behavior During Therapy  [facility] for tasks assessed/performed       Past Medical History:  Diagnosis Date  . High cholesterol   . Hypertension   . Thyroid disease     No past surgical history on file.  There were no vitals filed for this visit.  Subjective Assessment - 10/15/17 1415    Subjective   Pt. reports that she continues to feel better after therapy, and stretching    Pertinent History  Pt is a 67 y.o. female who [facility]. Pt. received inpatient rehabilitation services followed by home health services. Pt. had surgery to repair a right drooping eyelid on February 19th., 2019. Pt. is ready for outpatient OT services.    Currently in Pain?  Yes    Pain Score  7     Pain Location  Shoulder    Pain Orientation  Right;Left    Pain Descriptors / Indicators  Aching       OT TREATMENT  Therapeutic Exercise:  Pt.worked on AROM in all joint ranges of the LUEin sitting, for scapular elevation, depression, abduction, rotation, retraction, internal, and external rotation supine and sitting.AROM with the left shoulder in supine for shoulder flexion, abduction, and protraction in supine. 1# dowel in supine for shoulder flexion, chest press, and circular motion followed by  1.5# dowel in sittng.  Pt. worked on red theraband ex for bilateral elbow flexion, and extension.                          OT Education - 10/15/17 1416    Education provided  Yes    Education Details  ROM exercises    Person(s) Educated  Patient    Methods  Explanation;Demonstration;Tactile cues;Verbal cues    Comprehension  Verbalized understanding;Returned demonstration          OT Long Term Goals - 10/08/17 1744      OT LONG TERM GOAL #1   Title  Pt. will increase UE strength by 2 mm grades to assist with ADLs, and IADL    Baseline  10/08/2017: Pt. is progressing with ROM, strengthening N/A secondary to bilateral shoulder pain    Time  12    Period  Weeks    Status  On-going    Target Date  12/10/17      OT LONG TERM GOAL #2   Title  Pt. will improve right hand [facility] skills by 3 sec. to be able to manipulate ADL items.    Baseline  10/08/2017:  Right: 21 sec., Left 25 sec.    Time  12    Period  Weeks    Status  On-going    Target Date  12/10/17      OT LONG TERM GOAL #3   Title  Pt. will demonstrate visual compensatory strategies 100% of the time during ADLs, and IADLs.    Baseline  09/17/2017: Pt. is improving    Time  12    Period  Weeks    Status  On-going    Target Date  12/10/17      OT LONG TERM GOAL #5   Title  Pt. will complete IADL, home management tasks with Supervision.     Baseline  10/08/2017: MinA    Time  12    Period  Weeks    Status  On-going    Target Date  12/10/17      OT LONG TERM GOAL #6   Title  Pt. will increase left shoulder ROM to be able to independently retrieve items from the cabinetry/colsets.    Baseline  10/08/2017: Pt. sitting AROM shoulder flexion: 108, abduction: 82, supine shoulder flexion: 125    Time  12    Period  Weeks    Status  On-going    Target Date  12/10/17            Plan - 10/15/17 1417    Clinical Impression Statement  Pt. reports her physician approved for her to have accupuncture  to her Bilateral shoulders, and face. Pt. is making progress overall with left dhoulder ROM.    Occupational Profile and client history currently impacting functional performance  Pt. 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    Occupational performance deficits (Please refer to evaluation for details):  ADL's;IADL's    Rehab Potential  Good    Current Impairments/barriers affecting progress:  Positive indicators: age, family support, motivation, Negative indicators: multiple comorbidities.    OT Frequency  2x / week    OT Duration  12 weeks    OT Treatment/Interventions  Self-care/ADL training;Neuromuscular education;Therapeutic activities;Cognitive remediation/compensation;Passive range of motion;DME and/or AE instruction;Patient/family education;Energy conservation;Therapeutic exercise;Manual Therapy    Clinical Decision Making  Several treatment options, min-mod task modification necessary    Consulted and Agree with Plan of Care  Patient       Patient will benefit from skilled therapeutic intervention in order to improve the following deficits and impairments:  Pain, Impaired UE functional use, Decreased knowledge of precautions, Decreased cognition, Impaired tone, Decreased strength, Decreased endurance, Decreased activity tolerance, Decreased knowledge of use of DME, Decreased balance  Visit Diagnosis: Muscle weakness (generalized)  Other lack of coordination    Problem List There are no active problems to display for this patient.   [name], MS, OTR/L 10/15/2017, 2:26 PM  [place] [place] [facility] MAIN [facility] SERVICES [address] [place], [place], [address] Phone: [phone]   Fax:  [phone]  Name: Haley Lucas MRN: [id number] Date of Birth: 03/10/1951

## 2017-10-22 ENCOUNTER — Encounter: Payer: Self-pay | Admitting: Occupational Therapy

## 2017-10-22 ENCOUNTER — Ambulatory Visit: Payer: Medicare Other | Admitting: Occupational Therapy

## 2017-10-22 ENCOUNTER — Ambulatory Visit: Payer: Medicare Other | Admitting: Physical Therapy

## 2017-10-22 ENCOUNTER — Encounter: Payer: Self-pay | Admitting: Physical Therapy

## 2017-10-22 DIAGNOSIS — R278 Other lack of coordination: Secondary | ICD-10-CM

## 2017-10-22 DIAGNOSIS — M6281 Muscle weakness (generalized): Secondary | ICD-10-CM

## 2017-10-22 DIAGNOSIS — R2681 Unsteadiness on feet: Secondary | ICD-10-CM

## 2017-10-22 DIAGNOSIS — R262 Difficulty in walking, not elsewhere classified: Secondary | ICD-10-CM

## 2017-10-22 DIAGNOSIS — H543 Unqualified visual loss, both eyes: Secondary | ICD-10-CM

## 2017-10-22 NOTE — Therapy (Addendum)
[facility] [address] [place], [place], [address] Phone: [phone]   Fax:  [phone]  Occupational Therapy Treatment  Patient Details  Name: Haley Lucas MRN: [id number] Date of Birth: 12/25/1950 Referring Provider: Marshell GarfinkelO, SARAH J    Encounter Date: 10/22/2017  OT End of Session - 10/22/17 1612    Visit Number  22    Number of Visits  24    Date for OT Re-Evaluation  12/10/17    Authorization Type  visit 2 of 10 for progress report period starting 10/15/2017    OT Start Time  1330    OT Stop Time  1415    OT Time Calculation (min)  45 min    Activity Tolerance  Patient tolerated treatment well    Behavior During Therapy  [facility] for tasks assessed/performed       Past Medical History:  Diagnosis Date  . High cholesterol   . Hypertension   . Thyroid disease     History reviewed. No pertinent surgical history.  There were no vitals filed for this visit.  Subjective Assessment - 10/22/17 1611    Subjective   Pt. reports that she continues to feel better after therapy, and stretching    Pertinent History  Pt is a 67 y.o. female who [facility]. Pt. received inpatient rehabilitation services followed by home health services. Pt. had surgery to repair a right drooping eyelid on February 19th., 2019. Pt. is ready for outpatient OT services.    Patient Stated Goals  Be independent    Currently in Pain?  Yes    Pain Score  6     Pain Location  Shoulder    Pain Descriptors / Indicators  Aching    Pain Onset  More than a month ago       OT TREATMENT  Therapeutic Exercise:  Pt.worked on AROM in all joint ranges of the LUEin sitting, for scapular elevation, depression, abduction, rotation, retraction, internal, and external rotationsupine and sitting.AROM with the left shoulder in supine for shoulder flexion, abduction, and protraction in supine. ROM measurementsfor  shoulder flexion, at the beginning of session was 124, supine, after ROM it improved to 136. ROM in sitting 112 degrees. 1.5# dowel in supine for shoulder flexion, chest press, and circular motion followed by reps in sittng. Pt. worked on red theraband ex for bilateral elbow flexion, and extension. Pt. Worked on using yellow, red, and green resistive clips to perform reaching with the LUE in various planes in flexion, and abduction.Pt. Required verbal cues, and visual demonstration to movement patterns.                         OT Education - 10/22/17 1611    Education provided  Yes    Education Details  ROM exercises    Person(s) Educated  Patient    Methods  Explanation;Demonstration;Tactile cues;Verbal cues    Comprehension  Verbalized understanding;Returned demonstration          OT Long Term Goals - 10/08/17 1744      OT LONG TERM GOAL #1   Title  Pt. will increase UE strength by 2 mm grades to assist with ADLs, and IADL    Baseline  10/08/2017: Pt. is progressing with ROM, strengthening N/A secondary to bilateral shoulder pain    Time  12    Period  Weeks  Status  On-going    Target Date  12/10/17      OT LONG TERM GOAL #2   Title  Pt. will improve right hand [facility] skills by 3 sec. to be able to manipulate ADL items.    Baseline  10/08/2017:  Right: 21 sec., Left 25 sec.    Time  12    Period  Weeks    Status  On-going    Target Date  12/10/17      OT LONG TERM GOAL #3   Title  Pt. will demonstrate visual compensatory strategies 100% of the time during ADLs, and IADLs.    Baseline  09/17/2017: Pt. is improving    Time  12    Period  Weeks    Status  On-going    Target Date  12/10/17      OT LONG TERM GOAL #5   Title  Pt. will complete IADL, home management tasks with Supervision.     Baseline  10/08/2017: MinA    Time  12    Period  Weeks    Status  On-going    Target Date  12/10/17      OT LONG TERM GOAL #6   Title  Pt. will increase left  shoulder ROM to be able to independently retrieve items from the cabinetry/colsets.    Baseline  10/08/2017: Pt. sitting AROM shoulder flexion: 108, abduction: 82, supine shoulder flexion: 125    Time  12    Period  Weeks    Status  On-going    Target Date  12/10/17            Plan - 10/22/17 1612    Clinical Impression Statement  Pt. continues to present with weakness, and limited UE strength. Pt. plans to have an injection in the left shoulder. Pt. continues to work on improving UE strength, and ROM  in order to be able to improve ADL, and IADL functioning.     Occupational Profile and client history currently impacting functional performance  Pt. 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    Occupational performance deficits (Please refer to evaluation for details):  ADL's;IADL's    Rehab Potential  Good    Current Impairments/barriers affecting progress:  Positive indicators: age, family support, motivation, Negative indicators: multiple comorbidities.    OT Frequency  2x / week    OT Duration  12 weeks    OT Treatment/Interventions  Self-care/ADL training;Neuromuscular education;Therapeutic activities;Cognitive remediation/compensation;Passive range of motion;DME and/or AE instruction;Patient/family education;Energy conservation;Therapeutic exercise;Manual Therapy    Clinical Decision Making  Several treatment options, min-mod task modification necessary    Consulted and Agree with Plan of Care  Patient       Patient will benefit from skilled therapeutic intervention in order to improve the following deficits and impairments:  Pain, Impaired UE functional use, Decreased knowledge of precautions, Decreased cognition, Impaired tone, Decreased strength, Decreased endurance, Decreased activity tolerance, Decreased knowledge of use of DME, Decreased balance  Visit Diagnosis: Muscle weakness (generalized)    Problem List There are no active problems to display  for this patient.   [name], MS, OTR/L 10/22/2017, 4:17 PM  [facility] MAIN [facility] SERVICES [address] [place], [place], [address] Phone: [phone]   Fax:  [phone]  Name: Haley Lucas MRN: [id number] Date of Birth: 09-07-1950

## 2017-10-22 NOTE — Therapy (Signed)
[place] MAIN [facility] SERVICES [address] [place], [place], [address] Phone: [phone]   Fax:  [phone]  Physical Therapy Treatment  Patient Details  Name: Haley Lucas MRN: [id number] Date of Birth: 1950-06-17 Referring Provider: [name]    Encounter Date: 10/22/2017  PT End of Session - 10/22/17 1441    Visit Number  26    Number of Visits  49    Date for PT Re-Evaluation  12/11/17    PT Start Time  0145    PT Stop Time  0230    PT Time Calculation (min)  45 min    Equipment Utilized During Treatment  Gait belt    Activity Tolerance  Patient tolerated treatment well    Behavior During Therapy  [facility] for tasks assessed/performed       Past Medical History:  Diagnosis Date  . High cholesterol   . Hypertension   . Thyroid disease     History reviewed. No pertinent surgical history.  There were no vitals filed for this visit.  Subjective Assessment - 10/22/17 1440    Subjective  Patient reports that she is not able to lie on her left side. She continues to have shoulder pain. She reports that her balance is a little bit better     Pertinent History  She had a hughes flap to her eye right eye feb 18th [facility]. She was in the hospital for a month from the first surgey and was discharged Dec 11th. She had PT and OT for  2 weeks and then HHPT PT, OT and ST.  Tha ended christmas. She was not using a Assistive devie prior to surgery. She has weakness in her arms and she is not able to walk her normal distances.     Limitations  Standing    How long can you stand comfortably?  10 mins    Patient Stated Goals  to be able to walk and stand for longer periods of time, and improve balance.     Currently in Pain?  Yes    Pain Score  7     Pain Location  Shoulder    Pain Orientation  Right;Left    Pain Descriptors / Indicators  Aching    Pain Onset  More than a month ago    Multiple Pain Sites  No    Pain Onset  In the past 7  days       Treatment:  Therapeutic activities: Outcome measures and goals reviewed and performed with progress made and re-certification recommended  Neuromuscular training:  Toe tapping 6 inch stool without UE assist from foam x 20 , cues for good postureues  Tandem gait in // bars x 4 laps ; CGA use due to increase postural sway but no LOB. Side stepping on blue foam balance beam x 5 lengths of the parallel bars ; Patient needs occasional verbal cueingand CGAto maintain center of gravity during all dynamic standing balance activities.Patient required UE support for side stepping. Standing on 1/2 foam with flat side down and head turns x 20 left and right Resisted walking: 22.5# forward/backward, side/side  x3 laps each direction with CGA for safety and cues to improve step length for better balance challenge; Required cues to slow down eccentric return for better balance control;  Leg press with 100 lbs x 20 x 2; heel raises with 75 lbs 20 x 2    Patient tolerated session well; denies  any painbut does report some fatigue at end of session                     PT Education - 10/22/17 1441    Education provided  Yes    Education Details  HEP    Person(s) Educated  Patient    Methods  Explanation;Demonstration;Verbal cues    Comprehension  Verbalized understanding;Returned demonstration       PT Short Term Goals - 09/23/17 1322      PT SHORT TERM GOAL #1   Title  Patient will be independent in home exercise program to improve strength/mobility for better functional independence with ADLs.    Baseline  Patient is doing well with her exercises    Time  4    Period  Weeks    Status  Achieved      PT SHORT TERM GOAL #2   Title  Patient (> 10 years old) will complete five times sit to stand test in < 15 seconds indicating an increased LE strength and improved balance.    Baseline  22.19 sec 07/31/17; 08/21/17: 19.5s    Time  4    Period  Weeks     Status  On-going        PT Long Term Goals - 10/22/17 1442      PT LONG TERM GOAL #1   Title  Patient will increase six minute walk test distance to >1000 for progression to community ambulator and improve gait ability    Baseline  07/31/17 1085 feet; 08/21/17: 1005',     Time  8    Period  Weeks    Status  Achieved      PT LONG TERM GOAL #2   Title  Patient will increase BLE gross strength to 4+/5 as to improve functional strength for independent gait, increased standing tolerance and increased ADL ability.    Baseline  3+/5 BLE hips, 08/21/17: hip flex/ext/abd/add 4/5, hip IR/ER 4+/5, Knee ext: 5/5, Knee flex: 4/4, 09/23/17 knee flex 4+/5, ext 5/5    Time  8    Period  Weeks    Status  Achieved      PT LONG TERM GOAL #3   Title  Patient will ascend/descend 4 stairs without rail assist independently without loss of balance to improve ability to get in/out of home.     Baseline  Definite need of railings and slow and guarded; 08/21/17: Need for rail, decreased speed, guarded, 09/23/17 Need for rail, decreased speed, guarded    Time  8    Period  Weeks    Status  Partially Met    Target Date  12/11/17      PT LONG TERM GOAL #4   Title  Patient will reduce timed up and go to <11 seconds to reduce fall risk and demonstrate improved transfer/gait ability.    Baseline  19.87 sec 07/31/17; 08/21/17: 11.8 sec, 09/23/17=12.79 sec, 12/20 sec 10/22/17    Time  8    Period  Weeks    Status  Partially Met    Target Date  12/11/17      PT LONG TERM GOAL #5   Title  patient will be able to ascend and descend the steps and carry items without railing.     Time  8    Period  Weeks    Status  New    Target Date  12/11/17  Plan - 10/22/17 1450    Clinical Impression Statement  Pt presents with unsteadiness on uneven surfaces and fatigues with therapeutic exercises. Patient needs assist with uneven surfaces and narrow base of support and needs CGA assist with xxx activities. Patient  demonstrates difficulty with dynamic standing balance and reaching out of base if support and increased challenges for UE.  Patient tolerated all interventions well this date and will benefit from continued skilled PT interventions to improve strength and balance and decrease risk of falling    Rehab Potential  Good    PT Frequency  2x / week    PT Duration  8 weeks    PT Treatment/Interventions  Gait training;Therapeutic exercise;Therapeutic activities;Stair training;Balance training;Neuromuscular re-education;Patient/family education;Manual techniques;Aquatic Therapy;Moist Heat;Electrical Stimulation    PT Next Visit Plan  balance and therapeutic exercise for hip weakness    Consulted and Agree with Plan of Care  Patient;Family member/caregiver       Patient will benefit from skilled therapeutic intervention in order to improve the following deficits and impairments:  Abnormal gait, Decreased balance, Decreased endurance, Decreased mobility, Difficulty walking, Decreased knowledge of precautions, Decreased activity tolerance, Decreased coordination, Decreased safety awareness, Decreased strength, Impaired flexibility, Postural dysfunction  Visit Diagnosis: Muscle weakness (generalized)  Other lack of coordination  Unsteadiness on feet  Difficulty in walking, not elsewhere classified  Low vision, both eyes     Problem List There are no active problems to display for this patient.   [address] [place] DPT 10/22/2017, 2:51 PM  [place] MAIN [facility] SERVICES [address] [place], [place], [address] Phone: [phone]   Fax:  [phone]  Name: Haley Lucas MRN: [id number] Date of Birth: Apr 15, 1951

## 2017-10-24 ENCOUNTER — Encounter: Payer: Self-pay | Admitting: Physical Therapy

## 2017-10-24 ENCOUNTER — Ambulatory Visit: Payer: Medicare Other | Admitting: Physical Therapy

## 2017-10-24 ENCOUNTER — Other Ambulatory Visit: Payer: Self-pay

## 2017-10-24 ENCOUNTER — Ambulatory Visit: Payer: Medicare Other | Admitting: Occupational Therapy

## 2017-10-24 DIAGNOSIS — H543 Unqualified visual loss, both eyes: Secondary | ICD-10-CM

## 2017-10-24 DIAGNOSIS — R278 Other lack of coordination: Secondary | ICD-10-CM

## 2017-10-24 DIAGNOSIS — M6281 Muscle weakness (generalized): Secondary | ICD-10-CM | POA: Diagnosis not present

## 2017-10-24 DIAGNOSIS — R2681 Unsteadiness on feet: Secondary | ICD-10-CM

## 2017-10-24 DIAGNOSIS — R262 Difficulty in walking, not elsewhere classified: Secondary | ICD-10-CM

## 2017-10-24 NOTE — Therapy (Signed)
[place] MAIN [facility] SERVICES [address] [place], [place], [address] Phone: [phone]   Fax:  [phone]  Physical Therapy Treatment  Patient Details  Name: Haley Lucas MRN: [id number] Date of Birth: [birth year] Referring Provider: [name]    Encounter Date: 10/24/2017  PT End of Session - 10/24/17 1440    Visit Number  27    Number of Visits  49    Date for PT Re-Evaluation  12/11/17    PT Start Time  0231    PT Stop Time  0315    PT Time Calculation (min)  44 min    Equipment Utilized During Treatment  Gait belt    Activity Tolerance  Patient tolerated treatment well    Behavior During Therapy  [facility] for tasks assessed/performed       Past Medical History:  Diagnosis Date  . High cholesterol   . Hypertension   . Thyroid disease     History reviewed. No pertinent surgical history.  There were no vitals filed for this visit.  Subjective Assessment - 10/24/17 1438    Subjective  Patient reports that she is not able to lie on her left side. She continues to have shoulder pain. She reports that her balance is a little bit better     Pertinent History  She had a hughes flap to her eye right eye feb 18th at [facility]. She was in the hospital for a month from the first surgey and was discharged Dec 11th. She had PT and OT for  2 weeks and then HHPT PT, OT and ST.  Tha ended christmas. She was not using a Assistive devie prior to surgery. She has weakness in her arms and she is not able to walk her normal distances.     Limitations  Standing    How long can you stand comfortably?  10 mins    Patient Stated Goals  to be able to walk and stand for longer periods of time, and improve balance.     Pain Onset  More than a month ago    Pain Onset  In the past 7 days         Warm up on octane fitness  BUE/BLE level 6 x5 min (Unbilled);   Standing on airex (x2): Feet slightly apart, eyes open/closed 10 sec hold x1 rep each with CGA  for safety; Modified tandem stance, eyes open/closed 10 sec hold x3 reps with min A and cues to improve weight shift for better stance control; Modified tandem stance, head turns side/side, up/down x 5 reps each, with min A for safety and min VCs to improve gaze stabilization for less loss of balance;   Star kicks, SLS, x2 sets each foot in center with min A for safety and cues to improve weight shift and upper trunk control for less loss of balance  TM walking side stepping . 8 m/hour x 3 mins each side left and right with BUE   Resisted walking: 12.5# forward/backward, side/side (x4 way) x2 laps each direction with CGA for safety and cues to improve step length for better balance challenge; Required cues to slow down eccentric return for better balance control;    Standing on 1/2 bolster (Flat side up): Heel/toe rock x15 with finger tip hold for balance with cues to keep knee straight for better ankle strategies; Feet in neutral, BUE wand flexion x15 reps with min A for safety and cues to improve  weight shift for better stance control; patient was able to utilize ankle strategies well with less loss of balance; Tandem stance on 1/2 bolster 10 sec hold unsupported x2 reps each foot in front with min A for safety and cues to improve upper weight shift and trunk control for better balance in narrow base of support;     Rockerboard: Calf stretch 20 sec hold x2 reps with min VCs for correct positioning; Forward/backward, side/side teeter x2 min each with rail assist for safety and cues to improve weight shift for better dynamic balance;    Patient tolerated session well; denies any pain but does report some fatigue at end of session;                       PT Education - 10/24/17 1440    Education provided  Yes    Education Details  HEP    Person(s) Educated  Patient    Methods  Explanation    Comprehension  Verbalized understanding;Returned demonstration;Verbal cues  required       PT Short Term Goals - 09/23/17 1322      PT SHORT TERM GOAL #1   Title  Patient will be independent in home exercise program to improve strength/mobility for better functional independence with ADLs.    Baseline  Patient is doing well with her exercises    Time  4    Period  Weeks    Status  Achieved      PT SHORT TERM GOAL #2   Title  Patient (> 66 years old) will complete five times sit to stand test in < 15 seconds indicating an increased LE strength and improved balance.    Baseline  22.19 sec 07/31/17; 08/21/17: 19.5s    Time  4    Period  Weeks    Status  On-going        PT Long Term Goals - 10/22/17 1442      PT LONG TERM GOAL #1   Title  Patient will increase six minute walk test distance to >1000 for progression to community ambulator and improve gait ability    Baseline  07/31/17 1085 feet; 08/21/17: 1005',     Time  8    Period  Weeks    Status  Achieved      PT LONG TERM GOAL #2   Title  Patient will increase BLE gross strength to 4+/5 as to improve functional strength for independent gait, increased standing tolerance and increased ADL ability.    Baseline  3+/5 BLE hips, 08/21/17: hip flex/ext/abd/add 4/5, hip IR/ER 4+/5, Knee ext: 5/5, Knee flex: 4/4, 09/23/17 knee flex 4+/5, ext 5/5    Time  8    Period  Weeks    Status  Achieved      PT LONG TERM GOAL #3   Title  Patient will ascend/descend 4 stairs without rail assist independently without loss of balance to improve ability to get in/out of home.     Baseline  Definite need of railings and slow and guarded; 08/21/17: Need for rail, decreased speed, guarded, 09/23/17 Need for rail, decreased speed, guarded    Time  8    Period  Weeks    Status  Partially Met    Target Date  12/11/17      PT LONG TERM GOAL #4   Title  Patient will reduce timed up and go to <11 seconds to reduce fall risk and demonstrate improved transfer/gait  ability.    Baseline  19.87 sec 07/31/17; 08/21/17: 11.8 sec, 09/23/17=12.79  sec, 12/20 sec 10/22/17    Time  8    Period  Weeks    Status  Partially Met    Target Date  12/11/17      PT LONG TERM GOAL #5   Title  patient will be able to ascend and descend the steps and carry items without railing.     Time  8    Period  Weeks    Status  New    Target Date  12/11/17            Plan - 10/24/17 1441    Clinical Impression Statement  Instructed patient in balance and strengthening exercise. Patient requires CGA to min A with advanced balance exercise. Patient requires cues for weight shift and trunk control for better balance. Patient also instructed to slow down LE movement during strengthening exercise for better motor control. Patient reports no increased fatigue at end of treatment session. Patient would benefit from additional skilled PT intervention to improve balance/gait safety and reduce fall risk    Rehab Potential  Good    PT Frequency  2x / week    PT Duration  8 weeks    PT Treatment/Interventions  Gait training;Therapeutic exercise;Therapeutic activities;Stair training;Balance training;Neuromuscular re-education;Patient/family education;Manual techniques;Aquatic Therapy;Moist Heat;Electrical Stimulation    PT Next Visit Plan  balance and therapeutic exercise for hip weakness    Consulted and Agree with Plan of Care  Patient;Family member/caregiver       Patient will benefit from skilled therapeutic intervention in order to improve the following deficits and impairments:  Abnormal gait, Decreased balance, Decreased endurance, Decreased mobility, Difficulty walking, Decreased knowledge of precautions, Decreased activity tolerance, Decreased coordination, Decreased safety awareness, Decreased strength, Impaired flexibility, Postural dysfunction  Visit Diagnosis: Muscle weakness (generalized)  Other lack of coordination  Unsteadiness on feet  Difficulty in walking, not elsewhere classified  Low vision, both eyes     Problem List There are  no active problems to display for this patient.   [address] [place] DPT 10/24/2017, 2:42 PM  [place] MAIN [facility] SERVICES [address] [place], [place], [address] Phone: [phone]   Fax:  [phone]  Name: Haley Lucas MRN: [id number] Date of Birth: 08/07/1950

## 2017-10-24 NOTE — Therapy (Signed)
[place] [facility] [address] [place], [place], [address] Phone: [phone]   Fax:  [phone]  Occupational Therapy Treatment  Patient Details  Name: Haley Lucas MRN: [id number] Date of Birth: 03/07/1951 Referring Provider: Marshell GarfinkelO, SARAH J    Encounter Date: 10/24/2017  OT End of Session - 10/24/17 1520    Visit Number  23    Number of Visits  24    Date for OT Re-Evaluation  12/10/17    Authorization Type  visit 3 of 10 for progress report period starting 10/15/2017    OT Start Time  1515    OT Stop Time  1600    OT Time Calculation (min)  45 min    Activity Tolerance  Patient tolerated treatment well    Behavior During Therapy  [facility] for tasks assessed/performed       Past Medical History:  Diagnosis Date  . High cholesterol   . Hypertension   . Thyroid disease     No past surgical history on file.  There were no vitals filed for this visit.  Subjective Assessment - 10/24/17 1519    Subjective   Pt. reports no changes today    Pertinent History  Pt is a 67 y.o. female who [facility]. Pt. received inpatient rehabilitation services followed by home health services. Pt. had surgery to repair a right drooping eyelid on February 19th., 2019. Pt. is ready for outpatient OT services.    Patient Stated Goals  Be independent    Currently in Pain?  Yes    Pain Score  7     Pain Location  Shoulder    Pain Orientation  Left;Right    Pain Type  Chronic pain    Pain Onset  More than a month ago       OT TREATMENT    Neuro muscular re-education:  Therapeutic Exercise:  Pt. performed 1.5# dowel ex. For UE strengthening secondary to weakness. Bilateral shoulder flexion, chest press, circular patterns, and elbow flexion/extension were performed.Pt. Worked on hand strengthening using Puttycize  tools with pink theraband using the knob turn, cap turn, peg turn, L-Bar and key turn  attachments to target specific components of movements in the hand. Pt. Worked with the knobturn attachments to work to focus on standard gripping, Pt. worked on using the key turn attachment for standard turning, lateral pinch turn, Pt. Worked on using the cap turn attachment for standard turning, Pt. worked on using the peg turn attachment to for standard turning using the digits, Pt. Performed hand strengthening with green theraputty. Pt. required cues for proper technique. Pt. worked on gross grip loop, lateral pinch, 3pt. pinch, gross digit extension, digit extension table spread, digit abduction loop, single digit extension loop, and thumb opposition. Pt. required verbal and tactile cues for proper technique.                        OT Education - 10/24/17 1520    Education provided  Yes    Education Details  ROM exercises    Person(s) Educated  Patient    Methods  Explanation;Demonstration;Tactile cues;Verbal cues    Comprehension  Verbalized understanding;Returned demonstration          OT Long Term Goals - 10/08/17 1744      OT LONG TERM GOAL #1   Title  Pt. will increase UE strength by 2 mm  grades to assist with ADLs, and IADL    Baseline  10/08/2017: Pt. is progressing with ROM, strengthening N/A secondary to bilateral shoulder pain    Time  12    Period  Weeks    Status  On-going    Target Date  12/10/17      OT LONG TERM GOAL #2   Title  Pt. will improve right hand [facility] skills by 3 sec. to be able to manipulate ADL items.    Baseline  10/08/2017:  Right: 21 sec., Left 25 sec.    Time  12    Period  Weeks    Status  On-going    Target Date  12/10/17      OT LONG TERM GOAL #3   Title  Pt. will demonstrate visual compensatory strategies 100% of the time during ADLs, and IADLs.    Baseline  09/17/2017: Pt. is improving    Time  12    Period  Weeks    Status  On-going    Target Date  12/10/17      OT LONG TERM GOAL #5   Title  Pt. will complete IADL,  home management tasks with Supervision.     Baseline  10/08/2017: MinA    Time  12    Period  Weeks    Status  On-going    Target Date  12/10/17      OT LONG TERM GOAL #6   Title  Pt. will increase left shoulder ROM to be able to independently retrieve items from the cabinetry/colsets.    Baseline  10/08/2017: Pt. sitting AROM shoulder flexion: 108, abduction: 82, supine shoulder flexion: 125    Time  12    Period  Weeks    Status  On-going    Target Date  12/10/17            Plan - 10/24/17 1521    Clinical Impression Statement  Pt. continues to plan on having an injection in her Left shoulder at her next physician vist. Pt. continues to work on improving LUE ROM, strength, and hand function skills.     Occupational Profile and client history currently impacting functional performance  Pt. 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    Occupational performance deficits (Please refer to evaluation for details):  ADL's;IADL's    Rehab Potential  Good    Current Impairments/barriers affecting progress:  Positive indicators: age, family support, motivation, Negative indicators: multiple comorbidities.    OT Frequency  2x / week    OT Duration  12 weeks    OT Treatment/Interventions  Self-care/ADL training;Neuromuscular education;Therapeutic activities;Cognitive remediation/compensation;Passive range of motion;DME and/or AE instruction;Patient/family education;Energy conservation;Therapeutic exercise;Manual Therapy    Clinical Decision Making  Several treatment options, min-mod task modification necessary    Consulted and Agree with Plan of Care  Patient       Patient will benefit from skilled therapeutic intervention in order to improve the following deficits and impairments:  Pain, Impaired UE functional use, Decreased knowledge of precautions, Decreased cognition, Impaired tone, Decreased strength, Decreased endurance, Decreased activity tolerance, Decreased  knowledge of use of DME, Decreased balance  Visit Diagnosis: Muscle weakness (generalized)  Other lack of coordination    Problem List There are no active problems to display for this patient.   [name], MS, OTR/L 10/24/2017, 3:29 PM  [place] [facility] MAIN [facility] SERVICES [address] [place], [place], [address] Phone: [phone]  Fax:  [phone]  Name: Haley Lucas MRN: [id number] Date of Birth: 1950/08/24

## 2017-10-29 ENCOUNTER — Encounter: Payer: Self-pay | Admitting: Physical Therapy

## 2017-10-29 ENCOUNTER — Ambulatory Visit: Payer: Medicare Other | Admitting: Occupational Therapy

## 2017-10-29 ENCOUNTER — Encounter: Payer: Self-pay | Admitting: Occupational Therapy

## 2017-10-29 ENCOUNTER — Ambulatory Visit: Payer: Medicare Other | Admitting: Physical Therapy

## 2017-10-29 DIAGNOSIS — M6281 Muscle weakness (generalized): Secondary | ICD-10-CM

## 2017-10-29 DIAGNOSIS — R278 Other lack of coordination: Secondary | ICD-10-CM

## 2017-10-29 DIAGNOSIS — R2681 Unsteadiness on feet: Secondary | ICD-10-CM

## 2017-10-29 DIAGNOSIS — R262 Difficulty in walking, not elsewhere classified: Secondary | ICD-10-CM

## 2017-10-29 DIAGNOSIS — H543 Unqualified visual loss, both eyes: Secondary | ICD-10-CM

## 2017-10-29 NOTE — Therapy (Signed)
[place] [facility] [address] [place], [place], [address] Phone: [phone]   Fax:  [phone]  Occupational Therapy Treatment  Patient Details  Name: Haley LemonsHyun S Lucas MRN: [id number] Date of Birth: 07/25/1950 Referring Provider: Marshell GarfinkelO, SARAH J    Encounter Date: 10/29/2017  OT End of Session - 10/29/17 1441    Visit Number  24    Number of Visits  48    Date for OT Re-Evaluation  12/10/17    Authorization Type  Visit 4 of 10 for progress report period starting 10/15/2017    OT Start Time  1355    OT Stop Time  1430    OT Time Calculation (min)  35 min    Activity Tolerance  Patient tolerated treatment well    Behavior During Therapy  [facility] for tasks assessed/performed       Past Medical History:  Diagnosis Date  . High cholesterol   . Hypertension   . Thyroid disease     History reviewed. No pertinent surgical history.  There were no vitals filed for this visit.  Subjective Assessment - 10/29/17 1440    Subjective   Pt. reports no changes today    Pertinent History  Pt is a 67 y.o. female who [facility]. Pt. received inpatient rehabilitation services followed by home health services. Pt. had surgery to repair a right drooping eyelid on February 19th., 2019. Pt. is ready for outpatient OT services.    Patient Stated Goals  Be independent    Currently in Pain?  Yes    Pain Score  7     Pain Orientation  Right;Left    Pain Descriptors / Indicators  Aching    Pain Type  Chronic pain      OT TREATMENT    Therapeutic Exercise:  Pt.worked on AROM in all joint ranges of the LUEin sitting, for scapular elevation, depression, abduction, rotation, retraction, internal, and external rotationsupine and sitting.AROM with the left shoulder in supine for shoulder flexion, abduction, and protraction in supine. Pt. Tolerated 1.5# dowel ex insupinefor shoulder flexion, chest press, and  circular motion in supinefollowed by reps in sittng. Pt. worked on red theraband ex for bilateral elbow flexion, and extension. Pt. worked on using red theraband for bilateral elbow flexion, and extension exercises.Pt. worked on left shoulder AAROM using the ladder.                           OT Education - 10/29/17 1441    Education provided  Yes    Education Details  ROM, and strengthening exercises    Person(s) Educated  Patient    Methods  Explanation;Demonstration;Tactile cues;Verbal cues    Comprehension  Verbalized understanding;Returned demonstration          OT Long Term Goals - 10/08/17 1744      OT LONG TERM GOAL #1   Title  Pt. will increase UE strength by 2 mm grades to assist with ADLs, and IADL    Baseline  10/08/2017: Pt. is progressing with ROM, strengthening N/A secondary to bilateral shoulder pain    Time  12    Period  Weeks    Status  On-going    Target Date  12/10/17      OT LONG TERM GOAL #2   Title  Pt. will improve right hand [facility] skills by 3 sec. to be able  to manipulate ADL items.    Baseline  10/08/2017:  Right: 21 sec., Left 25 sec.    Time  12    Period  Weeks    Status  On-going    Target Date  12/10/17      OT LONG TERM GOAL #3   Title  Pt. will demonstrate visual compensatory strategies 100% of the time during ADLs, and IADLs.    Baseline  09/17/2017: Pt. is improving    Time  12    Period  Weeks    Status  On-going    Target Date  12/10/17      OT LONG TERM GOAL #5   Title  Pt. will complete IADL, home management tasks with Supervision.     Baseline  10/08/2017: MinA    Time  12    Period  Weeks    Status  On-going    Target Date  12/10/17      OT LONG TERM GOAL #6   Title  Pt. will increase left shoulder ROM to be able to independently retrieve items from the cabinetry/colsets.    Baseline  10/08/2017: Pt. sitting AROM shoulder flexion: 108, abduction: 82, supine shoulder flexion: 125    Time  12    Period   Weeks    Status  On-going    Target Date  12/10/17            Plan - 10/29/17 1442    Clinical Impression Statement  Pt. reports thet her next physician visit is planned for August 16th. Pt. plans to have an injection in her shoulder during that visit. Pt. continues to work on improving UE strength, and [facility] skills. Pt. continues to work on improving UE ROM, strength, and activity tolerance in order to improve functioanl hand use during ADLs, and IADLs.    Occupational Profile and client history currently impacting functional performance  Pt. 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    Occupational performance deficits (Please refer to evaluation for details):  ADL's;IADL's    Rehab Potential  Good    Current Impairments/barriers affecting progress:  Positive indicators: age, family support, motivation, Negative indicators: multiple comorbidities.    OT Frequency  2x / week    OT Treatment/Interventions  Self-care/ADL training;Neuromuscular education;Therapeutic activities;Cognitive remediation/compensation;Passive range of motion;DME and/or AE instruction;Patient/family education;Energy conservation;Therapeutic exercise;Manual Therapy    Clinical Decision Making  Several treatment options, min-mod task modification necessary    Consulted and Agree with Plan of Care  Patient       Patient will benefit from skilled therapeutic intervention in order to improve the following deficits and impairments:  Pain, Impaired UE functional use, Decreased knowledge of precautions, Decreased cognition, Impaired tone, Decreased strength, Decreased endurance, Decreased activity tolerance, Decreased knowledge of use of DME, Decreased balance  Visit Diagnosis: Muscle weakness (generalized)  Other lack of coordination    Problem List There are no active problems to display for this patient.   [name], MS, OTR/L 10/29/2017, 2:51 PM  [place] [facility] MAIN [facility] SERVICES [address] [place], [place], [address] Phone: [phone]   Fax:  [phone]  Name: Haley Lucas MRN: [id number] Date of Birth: 1951-02-21

## 2017-10-29 NOTE — Therapy (Signed)
[place] MAIN [facility] SERVICES [address] [place], [place], [address] Phone: [phone]   Fax:  [phone]  Physical Therapy Treatment  Patient Details  Name: Haley Lucas MRN: [id number] Date of Birth: Aug 01, 1950 Referring Provider: [name]    Encounter Date: 10/29/2017  PT End of Session - 10/29/17 1316    Visit Number  28    Number of Visits  49    Date for PT Re-Evaluation  12/11/17    PT Start Time  0105    PT Stop Time  0145    PT Time Calculation (min)  40 min    Equipment Utilized During Treatment  Gait belt    Activity Tolerance  Patient tolerated treatment well    Behavior During Therapy  [facility] for tasks assessed/performed       Past Medical History:  Diagnosis Date  . High cholesterol   . Hypertension   . Thyroid disease     History reviewed. No pertinent surgical history.  There were no vitals filed for this visit.  Subjective Assessment - 10/29/17 1314    Subjective  Patient reports that she is not able to lie on her left side. She continues to have shoulder pain. She reports that her balance is a little bit better     Pertinent History  She had a hughes flap to her eye right eye feb 18th at [facility]. She was in the hospital for a month from the first surgey and was discharged Dec 11th. She had PT and OT for  2 weeks and then HHPT PT, OT and ST.  Tha ended christmas. She was not using a Assistive devie prior to surgery. She has weakness in her arms and she is not able to walk her normal distances.     Limitations  Standing    How long can you stand comfortably?  10 mins    Patient Stated Goals  to be able to walk and stand for longer periods of time, and improve balance.     Currently in Pain?  Yes    Pain Score  7     Pain Location  Shoulder    Pain Orientation  Left;Right    Pain Descriptors / Indicators  Aching    Pain Type  Chronic pain    Pain Onset  More than a month ago    Pain Frequency  Constant    Pain Onset  In the past 7 days       Neuromuscular training;  Standing on airex: Modified tandem stance: BUE ball pass side/side x5 reps each direction with CGA for safety and cues to keep both hands on ball for better trunk rotation and balance challenge; Patient would start to loose balance and then would grab for the rail; Instructed patient to improve upper trunk control for less loss of balance;    Standing on 1/2 bolster: Bouncing ball on mirror  x10 reps with no B rail assist with cues to keep knee straight for better ankle strategies and weight shift; Feet in neutral, BUE wand flexion x10 reps with mod A and cues to improve weight shift for better stance control; Patient often leans forward having difficulty with posterior weight shift;   Standing on airex (x2): Feet slightly apart, eyes open/closed 10 sec hold x1 rep each with CGA for safety; Modified tandem stance, eyes open/closed 10 sec hold x3 reps with min A and cues to improve weight shift for better  stance control; Modified tandem stance, head turns side/side, up/down x 5 reps each, with min A for safety and min VCs to improve gaze stabilization for less loss of balance;   Resisted walking: 12.5# forward/backward, side/side (x4 way) x2 laps each direction with CGA for safety and cues to improve step length for better balance challenge; Required cues to slow down eccentric return for better balance control;    Standing on 1/2 bolster (Flat side up):  Ball [employer] with no hold for balance with cues to keep knee straight for better ankle strategies; Feet in neutral, BUE wand flexion x15 reps with min A for safety and cues to improve weight shift for better stance control; patient was able to utilize ankle strategies well with less loss of balance; Tandem stance on 1/2 bolster 10 sec hold unsupported x2 reps each foot in front with min A for safety and cues to improve upper weight shift and trunk control for better balance in  narrow base of support;     Rockerboard:  Forward/backward, side/side teeter x 2 min each with rail assist for safety and cues to improve weight shift for better dynamic balance;    Patient tolerated session well; denies any pain but does report some fatigue at end of session;   BUE shoulder stretching for ER and anterior shoulder 30 sec x 3 BUE   good tolerance and B shoulder pain is reported better following treatment 5/10                  PT Education - 10/29/17 1315    Education provided  Yes    Education Details  HEP    Person(s) Educated  Patient    Comprehension  Verbalized understanding       PT Short Term Goals - 09/23/17 1322      PT SHORT TERM GOAL #1   Title  Patient will be independent in home exercise program to improve strength/mobility for better functional independence with ADLs.    Baseline  Patient is doing well with her exercises    Time  4    Period  Weeks    Status  Achieved      PT SHORT TERM GOAL #2   Title  Patient (> 89 years old) will complete five times sit to stand test in < 15 seconds indicating an increased LE strength and improved balance.    Baseline  22.19 sec 07/31/17; 08/21/17: 19.5s    Time  4    Period  Weeks    Status  On-going        PT Long Term Goals - 10/22/17 1442      PT LONG TERM GOAL #1   Title  Patient will increase six minute walk test distance to >1000 for progression to community ambulator and improve gait ability    Baseline  07/31/17 1085 feet; 08/21/17: 1005',     Time  8    Period  Weeks    Status  Achieved      PT LONG TERM GOAL #2   Title  Patient will increase BLE gross strength to 4+/5 as to improve functional strength for independent gait, increased standing tolerance and increased ADL ability.    Baseline  3+/5 BLE hips, 08/21/17: hip flex/ext/abd/add 4/5, hip IR/ER 4+/5, Knee ext: 5/5, Knee flex: 4/4, 09/23/17 knee flex 4+/5, ext 5/5    Time  8    Period  Weeks    Status  Achieved  PT LONG  TERM GOAL #3   Title  Patient will ascend/descend 4 stairs without rail assist independently without loss of balance to improve ability to get in/out of home.     Baseline  Definite need of railings and slow and guarded; 08/21/17: Need for rail, decreased speed, guarded, 09/23/17 Need for rail, decreased speed, guarded    Time  8    Period  Weeks    Status  Partially Met    Target Date  12/11/17      PT LONG TERM GOAL #4   Title  Patient will reduce timed up and go to <11 seconds to reduce fall risk and demonstrate improved transfer/gait ability.    Baseline  19.87 sec 07/31/17; 08/21/17: 11.8 sec, 09/23/17=12.79 sec, 12/20 sec 10/22/17    Time  8    Period  Weeks    Status  Partially Met    Target Date  12/11/17      PT LONG TERM GOAL #5   Title  patient will be able to ascend and descend the steps and carry items without railing.     Time  8    Period  Weeks    Status  New    Target Date  12/11/17            Plan - 10/29/17 1318    Clinical Impression Statement  Patient requires verbal and tactile cueing during tandem balance with eyes closed and required CGA during all dynamic standing balance activities. Patient reported fatigued more frequently today due to constant shoulder pain.  Patient has also reported  improved balance wiht improvement. Patient will continue to benefit from skilled therapy in order to decrease risk for falls and improve dynamic standing balance.    Rehab Potential  Good    PT Frequency  2x / week    PT Duration  8 weeks    PT Treatment/Interventions  Gait training;Therapeutic exercise;Therapeutic activities;Stair training;Balance training;Neuromuscular re-education;Patient/family education;Manual techniques;Aquatic Therapy;Moist Heat;Electrical Stimulation    PT Next Visit Plan  balance and therapeutic exercise for hip weakness    Consulted and Agree with Plan of Care  Patient;Family member/caregiver       Patient will benefit from skilled therapeutic  intervention in order to improve the following deficits and impairments:  Abnormal gait, Decreased balance, Decreased endurance, Decreased mobility, Difficulty walking, Decreased knowledge of precautions, Decreased activity tolerance, Decreased coordination, Decreased safety awareness, Decreased strength, Impaired flexibility, Postural dysfunction  Visit Diagnosis: Muscle weakness (generalized)  Other lack of coordination  Unsteadiness on feet  Difficulty in walking, not elsewhere classified  Low vision, both eyes     Problem List There are no active problems to display for this patient.   [address], [place] DPT 10/29/2017, 1:19 PM  [place] MAIN [facility] SERVICES [address] [place], [place], [address] Phone: [phone]   Fax:  [phone]  Name: Haley Lucas MRN: [id number] Date of Birth: 11-Apr-1951

## 2017-10-31 ENCOUNTER — Ambulatory Visit: Payer: Medicare Other | Admitting: Physical Therapy

## 2017-10-31 ENCOUNTER — Encounter: Payer: Self-pay | Admitting: Physical Therapy

## 2017-10-31 DIAGNOSIS — M6281 Muscle weakness (generalized): Secondary | ICD-10-CM

## 2017-10-31 DIAGNOSIS — R278 Other lack of coordination: Secondary | ICD-10-CM

## 2017-10-31 DIAGNOSIS — R262 Difficulty in walking, not elsewhere classified: Secondary | ICD-10-CM

## 2017-10-31 DIAGNOSIS — H543 Unqualified visual loss, both eyes: Secondary | ICD-10-CM

## 2017-10-31 DIAGNOSIS — R2681 Unsteadiness on feet: Secondary | ICD-10-CM

## 2017-10-31 NOTE — Therapy (Signed)
[place] MAIN [facility] SERVICES [address] [place], [place], [address] Phone: [phone]   Fax:  [phone]  Physical Therapy Treatment  Patient Details  Name: Haley Lucas MRN: [id number] Date of Birth: [birth year] Referring Provider: [name]    Encounter Date: 10/31/2017    Past Medical History:  Diagnosis Date  . High cholesterol   . Hypertension   . Thyroid disease     History reviewed. No pertinent surgical history.  There were no vitals filed for this visit.  Subjective Assessment - 10/31/17 1356    Subjective  Patient reports that she is not able to lie on her left side. She continues to have shoulder pain. She reports that her balance is a little bit better     Pertinent History  She had a hughes flap to her eye right eye feb 18th at [facility]. She was in the hospital for a month from the first surgey and was discharged Dec 11th. She had PT and OT for  2 weeks and then HHPT PT, OT and ST.  Tha ended christmas. She was not using a Assistive devie prior to surgery. She has weakness in her arms and she is not able to walk her normal distances.     Limitations  Standing    How long can you stand comfortably?  10 mins    Patient Stated Goals  to be able to walk and stand for longer periods of time, and improve balance.     Pain Onset  More than a month ago    Pain Onset  In the past 7 days          Treatment:  Forward/ backward walking 3# x10 feet x3 laps Side stepping 3# x10 feet x3 laps each direction Patient able to progress to less rail assist with dynamic steps; He required min Vcs for step length and positioning to isolate hip muscles and improve dynamic balance;    SLS with 1 rail assist 10 sec hold x2 bilaterally; Instructed patient to hold onto sink and work on standing on single leg for at least 10 sec to improve strength and tolerance with standing on one leg  Tandem stance modified unsupported 10 sec hold x2  each foot in front; demonstrates good stance control with minimal lateral loss of balance/unsteadiness;   Side stepping on blue balance beam left and right x 10 lengths, cues for going slowly and she needs UE support with LOB backwards Tandem standing on 1/2 foam  , cues for better posture 2 mins  side stepping left and right YTB in parallel bars 10 feet x 3 step ups from floor to 6 inch stool x 20 bilateral  Tilt board fwd/bwd, side to side left and right, needs UE support Stepping over bolster left and right and fwd/bwd, needs UE support with LOB backwards   CGA and Min to mod verbal cues used throughout with increased in postural sway and LOB most seen with narrow base of support and while on uneven surfaces. Continues to have balance deficits typical with diagnosis. Patient performs intermediate level exercises without pain behaviors and needs verbal cuing for postural alignment and head positioning              PT Education - 10/31/17 1431    Education provided  Yes    Education Details  HEP    Person(s) Educated  Patient    Methods  Explanation    Comprehension  Verbalized understanding       PT Short Term Goals - 09/23/17 1322      PT SHORT TERM GOAL #1   Title  Patient will be independent in home exercise program to improve strength/mobility for better functional independence with ADLs.    Baseline  Patient is doing well with her exercises    Time  4    Period  Weeks    Status  Achieved      PT SHORT TERM GOAL #2   Title  Patient (> 66 years old) will complete five times sit to stand test in < 15 seconds indicating an increased LE strength and improved balance.    Baseline  22.19 sec 07/31/17; 08/21/17: 19.5s    Time  4    Period  Weeks    Status  On-going        PT Long Term Goals - 10/22/17 1442      PT LONG TERM GOAL #1   Title  Patient will increase six minute walk test distance to >1000 for progression to community ambulator and improve gait ability     Baseline  07/31/17 1085 feet; 08/21/17: 1005',     Time  8    Period  Weeks    Status  Achieved      PT LONG TERM GOAL #2   Title  Patient will increase BLE gross strength to 4+/5 as to improve functional strength for independent gait, increased standing tolerance and increased ADL ability.    Baseline  3+/5 BLE hips, 08/21/17: hip flex/ext/abd/add 4/5, hip IR/ER 4+/5, Knee ext: 5/5, Knee flex: 4/4, 09/23/17 knee flex 4+/5, ext 5/5    Time  8    Period  Weeks    Status  Achieved      PT LONG TERM GOAL #3   Title  Patient will ascend/descend 4 stairs without rail assist independently without loss of balance to improve ability to get in/out of home.     Baseline  Definite need of railings and slow and guarded; 08/21/17: Need for rail, decreased speed, guarded, 09/23/17 Need for rail, decreased speed, guarded    Time  8    Period  Weeks    Status  Partially Met    Target Date  12/11/17      PT LONG TERM GOAL #4   Title  Patient will reduce timed up and go to <11 seconds to reduce fall risk and demonstrate improved transfer/gait ability.    Baseline  19.87 sec 07/31/17; 08/21/17: 11.8 sec, 09/23/17=12.79 sec, 12/20 sec 10/22/17    Time  8    Period  Weeks    Status  Partially Met    Target Date  12/11/17      PT LONG TERM GOAL #5   Title  patient will be able to ascend and descend the steps and carry items without railing.     Time  8    Period  Weeks    Status  New    Target Date  12/11/17            Plan - 10/31/17 1431    Clinical Impression Statement  Pt presents with unsteadiness on uneven surfaces and fatigues with therapeutic exercises. Patient needs assist with uneven surfaces and narrow base of support and needs CGA assist with xxx activities. Patient demonstrates difficulty with dynamic standing balance and reaching out of base if support and increased challenges for UE. Patient tolerated all interventions well this date and  will benefit from continued skilled PT interventions to  improve strength and balance and decrease risk of falling    Rehab Potential  Good    PT Frequency  2x / week    PT Duration  8 weeks    PT Treatment/Interventions  Gait training;Therapeutic exercise;Therapeutic activities;Stair training;Balance training;Neuromuscular re-education;Patient/family education;Manual techniques;Aquatic Therapy;Moist Heat;Electrical Stimulation    PT Next Visit Plan  balance and therapeutic exercise for hip weakness    Consulted and Agree with Plan of Care  Patient;Family member/caregiver       Patient will benefit from skilled therapeutic intervention in order to improve the following deficits and impairments:  Abnormal gait, Decreased balance, Decreased endurance, Decreased mobility, Difficulty walking, Decreased knowledge of precautions, Decreased activity tolerance, Decreased coordination, Decreased safety awareness, Decreased strength, Impaired flexibility, Postural dysfunction  Visit Diagnosis: Muscle weakness (generalized)  Other lack of coordination  Unsteadiness on feet  Difficulty in walking, not elsewhere classified  Low vision, both eyes     Problem List There are no active problems to display for this patient.   [address] [place] DPT 10/31/2017, 2:34 PM  [place] MAIN [facility] SERVICES [address] [place], [place], [address] Phone: [phone]   Fax:  [phone]  Name: TALOR DESROSIERS MRN: [id number] Date of Birth: May 05, 1950

## 2017-11-05 ENCOUNTER — Encounter: Payer: Self-pay | Admitting: Occupational Therapy

## 2017-11-05 ENCOUNTER — Ambulatory Visit: Payer: Medicare Other | Admitting: Occupational Therapy

## 2017-11-05 ENCOUNTER — Encounter: Payer: Self-pay | Admitting: Physical Therapy

## 2017-11-05 ENCOUNTER — Ambulatory Visit: Payer: Medicare Other | Admitting: Physical Therapy

## 2017-11-05 DIAGNOSIS — R278 Other lack of coordination: Secondary | ICD-10-CM

## 2017-11-05 DIAGNOSIS — M6281 Muscle weakness (generalized): Secondary | ICD-10-CM

## 2017-11-05 DIAGNOSIS — R2681 Unsteadiness on feet: Secondary | ICD-10-CM

## 2017-11-05 DIAGNOSIS — R262 Difficulty in walking, not elsewhere classified: Secondary | ICD-10-CM

## 2017-11-05 DIAGNOSIS — H543 Unqualified visual loss, both eyes: Secondary | ICD-10-CM

## 2017-11-05 NOTE — Therapy (Signed)
[place] [facility] MAIN [facility] SERVICES [address] [place], [place], [address] Phone: [phone]   Fax:  [phone]  Occupational Therapy Treatment  Patient Details  Name: Haley Lucas MRN: [id number] Date of Birth: June 23, 1950 Referring Provider: Marshell Garfinkel    Encounter Date: 11/05/2017  OT End of Session - 11/05/17 1636    Visit Number  25    Number of Visits  48    Date for OT Re-Evaluation  12/10/17    Authorization Type  Visit 5 of 10 for progress report period starting 10/15/2017    OT Start Time  1345    OT Stop Time  1423    OT Time Calculation (min)  38 min    Activity Tolerance  Patient tolerated treatment well    Behavior During Therapy  [facility] for tasks assessed/performed       Past Medical History:  Diagnosis Date  . High cholesterol   . Hypertension   . Thyroid disease     History reviewed. No pertinent surgical history.  There were no vitals filed for this visit.  Subjective Assessment - 11/05/17 1635    Subjective   Pt. reports she feels good today.    Pertinent History  Pt is a 67 y.o. 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 Pt. received inpatient rehabilitation services followed by home health services. Pt. had surgery to repair a right drooping eyelid on February 19th., 2019. Pt. is ready for outpatient OT services.    Patient Stated Goals  Be independent    Currently in Pain?  Yes    Pain Score  7     Pain Location  Shoulder    Pain Orientation  Left;Right    Pain Descriptors / Indicators  Aching    Pain Onset  More than a month ago      OT TREATMENT    Therapeutic Exercise:  Pt.worked on AROM in all joint ranges of the LUEin sitting, for scapular elevation, depression, abduction, rotation, retraction, internal, and external rotationsupine and sitting.AROM with the left shoulder in supine for shoulder flexion, abduction, and protraction in supine. Pt. tolerated 1.5# dowel ex  insupinefor shoulder flexion, chest press, and circular motion in supinefollowed byreps insittng. Pt. worked on the [employer] for 6 min. with constant monitoring of the BUEs. Pt. worked on level 1.0 increasing to 1.5. Pt. worked on reaching with her LUE at various heights, and angles to challenge her reach.                          OT Education - 11/05/17 1636    Education provided  Yes    Education Details  ROM, and strengthening exercises    Person(s) Educated  Patient    Methods  Explanation;Demonstration;Tactile cues;Verbal cues    Comprehension  Verbalized understanding;Returned demonstration          OT Long Term Goals - 10/08/17 1744      OT LONG TERM GOAL #1   Title  Pt. will increase UE strength by 2 mm grades to assist with ADLs, and IADL    Baseline  10/08/2017: Pt. is progressing with ROM, strengthening N/A secondary to bilateral shoulder pain    Time  12    Period  Weeks    Status  On-going    Target Date  12/10/17      OT LONG TERM GOAL #2   Title  Pt. will improve right hand [facility] skills by 3 sec. to be able to manipulate ADL items.    Baseline  10/08/2017:  Right: 21 sec., Left 25 sec.    Time  12    Period  Weeks    Status  On-going    Target Date  12/10/17      OT LONG TERM GOAL #3   Title  Pt. will demonstrate visual compensatory strategies 100% of the time during ADLs, and IADLs.    Baseline  09/17/2017: Pt. is improving    Time  12    Period  Weeks    Status  On-going    Target Date  12/10/17      OT LONG TERM GOAL #5   Title  Pt. will complete IADL, home management tasks with Supervision.     Baseline  10/08/2017: MinA    Time  12    Period  Weeks    Status  On-going    Target Date  12/10/17      OT LONG TERM GOAL #6   Title  Pt. will increase left shoulder ROM to be able to independently retrieve items from the cabinetry/colsets.    Baseline  10/08/2017: Pt. sitting AROM shoulder flexion: 108, abduction: 82, supine shoulder  flexion: 125    Time  12    Period  Weeks    Status  On-going    Target Date  12/10/17            Plan - 11/05/17 1638    Clinical Impression Statement  Pt. continues to plan on having an injection in her shoulders at her next MD visit on August 16th. Pt. has made progress with ROM. Pt. continues to work on improving BUE functioning., and is improving tolerance for ADL, and IADLs.    Occupational Profile and client history currently impacting functional performance  Pt. 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    Occupational performance deficits (Please refer to evaluation for details):  ADL's;IADL's    Rehab Potential  Good    Current Impairments/barriers affecting progress:  Positive indicators: age, family support, motivation, Negative indicators: multiple comorbidities.    OT Frequency  2x / week    OT Duration  12 weeks    OT Treatment/Interventions  Self-care/ADL training;Neuromuscular education;Therapeutic activities;Cognitive remediation/compensation;Passive range of motion;DME and/or AE instruction;Patient/family education;Energy conservation;Therapeutic exercise;Manual Therapy    Clinical Decision Making  Several treatment options, min-mod task modification necessary    Consulted and Agree with Plan of Care  Patient       Patient will benefit from skilled therapeutic intervention in order to improve the following deficits and impairments:  Pain, Impaired UE functional use, Decreased knowledge of precautions, Decreased cognition, Impaired tone, Decreased strength, Decreased endurance, Decreased activity tolerance, Decreased knowledge of use of DME, Decreased balance  Visit Diagnosis: Muscle weakness (generalized)  Other lack of coordination    Problem List There are no active problems to display for this patient.   Olegario MessierElaine Maximillian Habibi, MS, OTR/L 11/05/2017, 4:45 PM  [place] [facility] [address] [place], [place], [id number] Phone: [phone]   Fax:  [phone]  Name: Haley Lucas MRN: [id number] Date of Birth: 08/18/1950

## 2017-11-05 NOTE — Therapy (Signed)
[place] MAIN [facility] SERVICES [address] [place], [place], [address] Phone: [phone]   Fax:  [phone]  Physical Therapy Treatment  Patient Details  Name: Haley Lucas MRN: [id number] Date of Birth: [birth year] Referring Provider: [name]    Encounter Date: 11/05/2017  PT End of Session - 11/05/17 1311    Visit Number  29    Number of Visits  49    Date for PT Re-Evaluation  12/11/17    PT Start Time  0102    PT Stop Time  0145    PT Time Calculation (min)  43 min    Equipment Utilized During Treatment  Gait belt    Activity Tolerance  Patient tolerated treatment well    Behavior During Therapy  [facility] for tasks assessed/performed       Past Medical History:  Diagnosis Date  . High cholesterol   . Hypertension   . Thyroid disease     History reviewed. No pertinent surgical history.  There were no vitals filed for this visit.  Subjective Assessment - 11/05/17 1310    Subjective  Patient reports that she is not able to lie on her left side. She continues to have shoulder pain. She reports that her balance is a little bit better     Pertinent History  She had a hughes flap to her eye right eye feb 18th at [facility]. She was in the hospital for a month from the first surgey and was discharged Dec 11th. She had PT and OT for  2 weeks and then HHPT PT, OT and ST.  Tha ended christmas. She was not using a Assistive devie prior to surgery. She has weakness in her arms and she is not able to walk her normal distances.     Limitations  Standing    How long can you stand comfortably?  10 mins    Patient Stated Goals  to be able to walk and stand for longer periods of time, and improve balance.     Currently in Pain?  Yes    Pain Score  7     Pain Location  Shoulder    Pain Orientation  Right;Left    Pain Descriptors / Indicators  Aching    Pain Type  Chronic pain    Pain Onset  More than a month ago    Pain Onset  In the past 7  days       Neuromuscular training:  Toe tapping 6 inch stool without UE assist from foam x 20 , cues for good postureues   Tandem gait in // bars x 4 laps ; CGA use due to increase postural sway but no LOB.  Side stepping on blue foam balance beam x 5 lengths of the parallel bars ; Patient needs occasional verbal cueingand CGAto maintain center of gravity during all dynamic standing balance activities.Patient required UE support for side stepping.  Standing on 1/2 foam with flat side down and head turns x 20 left and right  Standing on foam NBOS sorting balls/ sorting shapes reaching  X 2 mins , cue to look up   Resisted walking: 12.5# forward/backward, side/side (x4 way) x2 laps each direction with CGA for safety and cues to improve step length for better balance challenge; Required cues to slow down eccentric return for better balance control;  Standing on 1/2 bolster (Flat side up): Heel/toe rock x15 with finger tip hold for balance with cues  to keep knee straight for better ankle strategies; Feet in neutral, BUE wand flexion x15reps with min A for safety and cues to improve weight shift for better stance control; patient was able to utilize ankle strategies well with less loss of balance; Tandem stance on 1/2 bolster 10 sec hold unsupported x2 reps each foot in front with min A for safety and cues to improve upper weight shift and trunk control for better balance in narrow base of support;  Rockerboard:  Forward/backward, side/side teeter x 2 min each with rail assist for safety and cues to improve weight shift for better dynamic balance;  Patient tolerated session well; denies any painbut does report some fatigue at end of session                        PT Education - 11/05/17 1310    Education provided  Yes    Education Details  HEP    Person(s) Educated  Patient    Methods  Explanation    Comprehension  Verbalized understanding        PT Short Term Goals - 09/23/17 1322      PT SHORT TERM GOAL #1   Title  Patient will be independent in home exercise program to improve strength/mobility for better functional independence with ADLs.    Baseline  Patient is doing well with her exercises    Time  4    Period  Weeks    Status  Achieved      PT SHORT TERM GOAL #2   Title  Patient (> 22 years old) will complete five times sit to stand test in < 15 seconds indicating an increased LE strength and improved balance.    Baseline  22.19 sec 07/31/17; 08/21/17: 19.5s    Time  4    Period  Weeks    Status  On-going        PT Long Term Goals - 10/22/17 1442      PT LONG TERM GOAL #1   Title  Patient will increase six minute walk test distance to >1000 for progression to community ambulator and improve gait ability    Baseline  07/31/17 1085 feet; 08/21/17: 1005',     Time  8    Period  Weeks    Status  Achieved      PT LONG TERM GOAL #2   Title  Patient will increase BLE gross strength to 4+/5 as to improve functional strength for independent gait, increased standing tolerance and increased ADL ability.    Baseline  3+/5 BLE hips, 08/21/17: hip flex/ext/abd/add 4/5, hip IR/ER 4+/5, Knee ext: 5/5, Knee flex: 4/4, 09/23/17 knee flex 4+/5, ext 5/5    Time  8    Period  Weeks    Status  Achieved      PT LONG TERM GOAL #3   Title  Patient will ascend/descend 4 stairs without rail assist independently without loss of balance to improve ability to get in/out of home.     Baseline  Definite need of railings and slow and guarded; 08/21/17: Need for rail, decreased speed, guarded, 09/23/17 Need for rail, decreased speed, guarded    Time  8    Period  Weeks    Status  Partially Met    Target Date  12/11/17      PT LONG TERM GOAL #4   Title  Patient will reduce timed up and go to <11 seconds to reduce fall  risk and demonstrate improved transfer/gait ability.    Baseline  19.87 sec 07/31/17; 08/21/17: 11.8 sec, 09/23/17=12.79 sec, 12/20 sec  10/22/17    Time  8    Period  Weeks    Status  Partially Met    Target Date  12/11/17      PT LONG TERM GOAL #5   Title  patient will be able to ascend and descend the steps and carry items without railing.     Time  8    Period  Weeks    Status  New    Target Date  12/11/17            Plan - 11/05/17 1311    Clinical Impression Statement  Patient requires verbal and tactile cueing during tandem balance with eyes closed and required CGA during all dynamic standing balance activities. Patient reported fatigued more frequently today due to constant shoulder pain. Patient has also reported improved balance wiht improvement. Patient will continue to benefit from skilled therapy in order to decrease risk for falls and improve    Rehab Potential  Good    PT Frequency  2x / week    PT Duration  8 weeks    PT Treatment/Interventions  Gait training;Therapeutic exercise;Therapeutic activities;Stair training;Balance training;Neuromuscular re-education;Patient/family education;Manual techniques;Aquatic Therapy;Moist Heat;Electrical Stimulation    PT Next Visit Plan  balance and therapeutic exercise for hip weakness    Consulted and Agree with Plan of Care  Patient;Family member/caregiver       Patient will benefit from skilled therapeutic intervention in order to improve the following deficits and impairments:  Abnormal gait, Decreased balance, Decreased endurance, Decreased mobility, Difficulty walking, Decreased knowledge of precautions, Decreased activity tolerance, Decreased coordination, Decreased safety awareness, Decreased strength, Impaired flexibility, Postural dysfunction  Visit Diagnosis: Muscle weakness (generalized)  Other lack of coordination  Unsteadiness on feet  Difficulty in walking, not elsewhere classified  Low vision, both eyes     Problem List There are no active problems to display for this patient.   [address], [place] DPT 11/05/2017, 1:13  PM  [place] MAIN [facility] SERVICES [address] [place], [place], [address] Phone: [phone]   Fax:  [phone]  Name: Haley Lucas MRN: [id number] Date of Birth: [birth year]

## 2017-11-07 ENCOUNTER — Encounter: Payer: Self-pay | Admitting: Occupational Therapy

## 2017-11-07 ENCOUNTER — Ambulatory Visit: Payer: Medicare Other | Admitting: Physical Therapy

## 2017-11-07 ENCOUNTER — Encounter: Payer: Self-pay | Admitting: Physical Therapy

## 2017-11-07 ENCOUNTER — Ambulatory Visit: Payer: Medicare Other | Admitting: Occupational Therapy

## 2017-11-07 ENCOUNTER — Other Ambulatory Visit: Payer: Self-pay

## 2017-11-07 DIAGNOSIS — H543 Unqualified visual loss, both eyes: Secondary | ICD-10-CM

## 2017-11-07 DIAGNOSIS — M6281 Muscle weakness (generalized): Secondary | ICD-10-CM | POA: Diagnosis not present

## 2017-11-07 DIAGNOSIS — R2681 Unsteadiness on feet: Secondary | ICD-10-CM

## 2017-11-07 DIAGNOSIS — R278 Other lack of coordination: Secondary | ICD-10-CM

## 2017-11-07 DIAGNOSIS — R262 Difficulty in walking, not elsewhere classified: Secondary | ICD-10-CM

## 2017-11-07 NOTE — Therapy (Signed)
[place] MAIN [facility] SERVICES [address] [place], [place], [address] Phone: [phone]   Fax:  [phone]  Physical Therapy Treatment Physical Therapy Progress Note   Dates of reporting period  09/23/17 to  11/07/17  Patient Details  Name: Haley Lucas MRN: [id number] Date of Birth: Feb 09, 1951 Referring Provider: [name]    Encounter Date: 11/07/2017  PT End of Session - 11/07/17 1524    Visit Number  30    Number of Visits  49    Date for PT Re-Evaluation  12/11/17    PT Start Time  0315    PT Stop Time  0400    PT Time Calculation (min)  45 min    Equipment Utilized During Treatment  Gait belt    Activity Tolerance  Patient tolerated treatment well    Behavior During Therapy  [facility] for tasks assessed/performed       Past Medical History:  Diagnosis Date  . High cholesterol   . Hypertension   . Thyroid disease     History reviewed. No pertinent surgical history.  There were no vitals filed for this visit.  Subjective Assessment - 11/07/17 1523    Subjective  Patient reports that she is not able to lie on her left side. She continues to have shoulder pain. She reports that her balance is a little bit better     Pertinent History  She had a hughes flap to her eye right eye feb 18th at [facility]. She was in the hospital for a month from the first surgey and was discharged Dec 11th. She had PT and OT for  2 weeks and then HHPT PT, OT and ST.  Tha ended christmas. She was not using a Assistive devie prior to surgery. She has weakness in her arms and she is not able to walk her normal distances.     Limitations  Standing    How long can you stand comfortably?  10 mins    Patient Stated Goals  to be able to walk and stand for longer periods of time, and improve balance.     Currently in Pain?  Yes    Pain Score  6     Pain Location  Shoulder    Pain Orientation  Right;Left    Pain Descriptors / Indicators  Aching    Pain Type   Chronic pain    Pain Onset  More than a month ago    Pain Onset  In the past 7 days       Neuromuscular training: 1/2 foam flat side up , stepping to purple foam and back to 1/2 foam  ; cues for keeping balance and using ankle and hips to maintain center of gravity  1/2 foam flat side down , catching ball; cues for keeping balance and using ankle and hips to maintain center of gravity  1/2 foam flat side down trunk rotation  standing on level surfaces; cues to maintain balance and posture correction  1/2 foam flat side and head turns , cues to try not to use UE and to keep correct head position  1/2 foam flat side and bouncing ball , cues to try not to use UE and to keep correct head position  Tandem walking on foam balance beam in parallel bars x 10 feet x 3 with cues to look up  Tilt board fwd/bwd, trunk rotation  side to side left and right; cues for posture correction  TM walking side stepping left and right x . 4 miles / hour; cues for posture corretion  Patient needs occasional verbal cueing to improve posture and cueing to correctly perform exercises slowly, holding at end of range to increase motor firing of desired muscle to encourage fatigue.                        PT Education - 11/07/17 1524    Education provided  Yes    Education Details  HEP    Person(s) Educated  Patient    Methods  Explanation;Tactile cues    Comprehension  Verbalized understanding;Returned demonstration;Verbal cues required       PT Short Term Goals - 09/23/17 1322      PT SHORT TERM GOAL #1   Title  Patient will be independent in home exercise program to improve strength/mobility for better functional independence with ADLs.    Baseline  Patient is doing well with her exercises    Time  4    Period  Weeks    Status  Achieved      PT SHORT TERM GOAL #2   Title  Patient (> 62 years old) will complete five times sit to stand test in < 15 seconds indicating an  increased LE strength and improved balance.    Baseline  22.19 sec 07/31/17; 08/21/17: 19.5s    Time  4    Period  Weeks    Status  On-going        PT Long Term Goals - 11/07/17 1525      PT LONG TERM GOAL #1   Title  Patient will increase six minute walk test distance to >1000 for progression to community ambulator and improve gait ability    Baseline  07/31/17 1085 feet; 08/21/17: 1005',     Time  8    Period  Weeks    Status  Achieved      PT LONG TERM GOAL #2   Title  Patient will increase BLE gross strength to 4+/5 as to improve functional strength for independent gait, increased standing tolerance and increased ADL ability.    Baseline  3+/5 BLE hips, 08/21/17: hip flex/ext/abd/add 4/5, hip IR/ER 4+/5, Knee ext: 5/5, Knee flex: 4/4, 09/23/17 knee flex 4+/5, ext 5/5    Time  8    Period  Weeks    Status  Achieved      PT LONG TERM GOAL #3   Title  Patient will ascend/descend 4 stairs without rail assist independently without loss of balance to improve ability to get in/out of home.     Baseline  Definite need of railings and slow and guarded; 08/21/17: Need for rail, decreased speed, guarded, 09/23/17 Need for rail, decreased speed, guarded    Time  8    Period  Weeks    Status  Partially Met    Target Date  12/11/17      PT LONG TERM GOAL #4   Title  Patient will reduce timed up and go to <11 seconds to reduce fall risk and demonstrate improved transfer/gait ability.    Baseline  19.87 sec 07/31/17; 08/21/17: 11.8 sec, 09/23/17=12.79 sec, 12/20 sec 10/22/17: 11/07/17=    Time  8    Period  Weeks    Status  Partially Met    Target Date  12/11/17      PT LONG TERM GOAL #5   Title  patient will be able to ascend and  descend the steps and carry items without railing.     Time  8    Period  Weeks    Status  Partially Met    Target Date  12/11/17            Plan - 11/07/17 1528    Clinical Impression Statement Patient's condition has the potential to improve in response to  therapy. Maximum improvement is yet to be obtained. The anticipated improvement is attainable and reasonable in a generally predictable time. Start date of reporting period 09/23/17 end date of reporting period  11/07/17. Patient reports that she is walking better and less loss of her balance.  Pt presents with unsteadiness on uneven surfaces and fatigues with therapeutic exercises. Patient needs assist with uneven surfaces and narrow base of support and needs CGA assist with xxx activities. Patient demonstrates difficulty with dynamic standing balance and reaching out of base if support and increased challenges for UE. Patient tolerated all interventions well this date and will benefit from continued skilled PT interventions to improve strength and balance and decrease risk of falling    Rehab Potential  Good    PT Frequency  2x / week    PT Duration  8 weeks    PT Treatment/Interventions  Gait training;Therapeutic exercise;Therapeutic activities;Stair training;Balance training;Neuromuscular re-education;Patient/family education;Manual techniques;Aquatic Therapy;Moist Heat;Electrical Stimulation    PT Next Visit Plan  balance and therapeutic exercise for hip weakness    Consulted and Agree with Plan of Care  Patient;Family member/caregiver       Patient will benefit from skilled therapeutic intervention in order to improve the following deficits and impairments:  Abnormal gait, Decreased balance, Decreased endurance, Decreased mobility, Difficulty walking, Decreased knowledge of precautions, Decreased activity tolerance, Decreased coordination, Decreased safety awareness, Decreased strength, Impaired flexibility, Postural dysfunction  Visit Diagnosis: Muscle weakness (generalized)  Other lack of coordination  Unsteadiness on feet  Difficulty in walking, not elsewhere classified  Low vision, both eyes     Problem List There are no active problems to display for this patient.   Arelia Sneddon S PT DPT 11/07/2017, 3:28 PM  [place] MAIN [facility] SERVICES [address] [place], [place], [address] Phone: [phone]   Fax:  [phone]  Name: BRINSLEY WENCE MRN: [id number] Date of Birth: August 12, 1950

## 2017-11-07 NOTE — Therapy (Signed)
[place] [facility] MAIN [facility] SERVICES [address] [place], [place], [address] Phone: [phone]   Fax:  [phone]  Occupational Therapy Treatment  Patient Details  Name: Haley Lucas MRN: [id number] Date of Birth: 24-Apr-1950 Referring Provider: Marshell Garfinkel    Encounter Date: 11/07/2017  OT End of Session - 11/07/17 1830    Visit Number  26    Number of Visits  48    Date for OT Re-Evaluation  12/10/17    Authorization Type  Visit 6 of 10 for progress report period starting 10/15/2017    OT Start Time  1600    OT Stop Time  1640    OT Time Calculation (min)  40 min    Activity Tolerance  Patient tolerated treatment well    Behavior During Therapy  [facility] for tasks assessed/performed       Past Medical History:  Diagnosis Date  . High cholesterol   . Hypertension   . Thyroid disease     History reviewed. No pertinent surgical history.  There were no vitals filed for this visit.  Subjective Assessment - 11/07/17 1829    Subjective   Pt. reports doing okay today    Pertinent History  Pt is a 67 y.o. 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 Pt. received inpatient rehabilitation services followed by home health services. Pt. had surgery to repair a right drooping eyelid on February 19th., 2019. Pt. is ready for outpatient OT services.    Patient Stated Goals  Be independent    Currently in Pain?  Yes    Pain Score  6     Pain Location  Shoulder    Pain Orientation  Right;Left    Pain Descriptors / Indicators  Aching    Pain Type  Chronic pain    Pain Onset  More than a month ago       OT TREATMENT  Therapeutic Exercise:  Pt.worked on AROM in all joint ranges of the LUEin sitting, for scapular elevation, depression, abduction, rotation, retraction, internal, and external rotationsupine and sitting.AROM with the left shoulder in supine for shoulder flexion, abduction, and protraction in supine. Pt.  tolerated1.5# dowelexinsupinefor shoulder flexion, chest press, and circular motionin supinefollowed byreps insittng. 2# dumbbell ex. for elbow flexion and extension, orearm supination/pronation, wrist flexion/extension, and radial deviation. Pt. requires rest breaks and verbal cues for proper technique. Pt. worked on reaching with her LUE at various heights, and angles to challenge her reach using resistive clips.                            OT Education - 11/07/17 1830    Education provided  Yes    Education Details  ROM, and strengthening exercises    Person(s) Educated  Patient    Methods  Explanation;Demonstration;Tactile cues;Verbal cues    Comprehension  Verbalized understanding;Returned demonstration          OT Long Term Goals - 10/08/17 1744      OT LONG TERM GOAL #1   Title  Pt. will increase UE strength by 2 mm grades to assist with ADLs, and IADL    Baseline  10/08/2017: Pt. is progressing with ROM, strengthening N/A secondary to bilateral shoulder pain    Time  12    Period  Weeks    Status  On-going    Target Date  12/10/17  OT LONG TERM GOAL #2   Title  Pt. will improve right hand [facility] skills by 3 sec. to be able to manipulate ADL items.    Baseline  10/08/2017:  Right: 21 sec., Left 25 sec.    Time  12    Period  Weeks    Status  On-going    Target Date  12/10/17      OT LONG TERM GOAL #3   Title  Pt. will demonstrate visual compensatory strategies 100% of the time during ADLs, and IADLs.    Baseline  09/17/2017: Pt. is improving    Time  12    Period  Weeks    Status  On-going    Target Date  12/10/17      OT LONG TERM GOAL #5   Title  Pt. will complete IADL, home management tasks with Supervision.     Baseline  10/08/2017: MinA    Time  12    Period  Weeks    Status  On-going    Target Date  12/10/17      OT LONG TERM GOAL #6   Title  Pt. will increase left shoulder ROM to be able to independently retrieve items  from the cabinetry/colsets.    Baseline  10/08/2017: Pt. sitting AROM shoulder flexion: 108, abduction: 82, supine shoulder flexion: 125    Time  12    Period  Weeks    Status  On-going    Target Date  12/10/17            Plan - 11/07/17 1831    Clinical Impression Statement  Pt. reports that she is looking into buying a house with her daughter. Pt. reports they are looking for homes in [place], [place], and [place]. Pt. is making progress with ROM overall. Pt. continues to have discomfort in her bilateral shoulders.     Occupational Profile and client history currently impacting functional performance  Pt. 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    Occupational performance deficits (Please refer to evaluation for details):  ADL's;IADL's    Rehab Potential  Good    Current Impairments/barriers affecting progress:  Positive indicators: age, family support, motivation, Negative indicators: multiple comorbidities.    OT Frequency  2x / week    OT Duration  12 weeks    OT Treatment/Interventions  Self-care/ADL training;Neuromuscular education;Therapeutic activities;Cognitive remediation/compensation;Passive range of motion;DME and/or AE instruction;Patient/family education;Energy conservation;Therapeutic exercise;Manual Therapy    Clinical Decision Making  Several treatment options, min-mod task modification necessary    Consulted and Agree with Plan of Care  Patient       Patient will benefit from skilled therapeutic intervention in order to improve the following deficits and impairments:  Pain, Impaired UE functional use, Decreased knowledge of precautions, Decreased cognition, Impaired tone, Decreased strength, Decreased endurance, Decreased activity tolerance, Decreased knowledge of use of DME, Decreased balance  Visit Diagnosis: Muscle weakness (generalized)  Other lack of coordination    Problem List There are no active problems to display for this  patient.   Haley MessierElaine Kyrstal Monterrosa, MS, OTR/L 11/07/2017, 6:36 PM  [place] [facility] MAIN [facility] [address] [place], [place], [address] Phone: [phone]   Fax:  [phone]  Name: Lavella LemonsHyun S Kohl MRN: [id number] Date of Birth: 10/20/1950

## 2017-11-12 ENCOUNTER — Encounter: Payer: Self-pay | Admitting: Occupational Therapy

## 2017-11-12 ENCOUNTER — Encounter: Payer: Self-pay | Admitting: Physical Therapy

## 2017-11-12 ENCOUNTER — Ambulatory Visit: Payer: Medicare Other | Admitting: Occupational Therapy

## 2017-11-12 ENCOUNTER — Ambulatory Visit: Payer: Medicare Other | Admitting: Physical Therapy

## 2017-11-12 DIAGNOSIS — M6281 Muscle weakness (generalized): Secondary | ICD-10-CM | POA: Diagnosis not present

## 2017-11-12 DIAGNOSIS — R2681 Unsteadiness on feet: Secondary | ICD-10-CM

## 2017-11-12 DIAGNOSIS — R278 Other lack of coordination: Secondary | ICD-10-CM

## 2017-11-12 DIAGNOSIS — H543 Unqualified visual loss, both eyes: Secondary | ICD-10-CM

## 2017-11-12 DIAGNOSIS — R262 Difficulty in walking, not elsewhere classified: Secondary | ICD-10-CM

## 2017-11-12 NOTE — Therapy (Signed)
[place] MAIN [facility] SERVICES [address] [place], [place], [address] Phone: [phone]   Fax:  [phone]  Physical Therapy Treatment  Patient Details  Name: Haley Lucas MRN: [id number] Date of Birth: 02/22/1951 Referring Provider: [name]    Encounter Date: 11/12/2017  PT End of Session - 11/12/17 1346    Visit Number  31    Number of Visits  49    Date for PT Re-Evaluation  12/11/17    PT Start Time  0145    PT Stop Time  0230    PT Time Calculation (min)  45 min    Equipment Utilized During Treatment  Gait belt    Activity Tolerance  Patient tolerated treatment well    Behavior During Therapy  [facility] for tasks assessed/performed       Past Medical History:  Diagnosis Date  . High cholesterol   . Hypertension   . Thyroid disease     History reviewed. No pertinent surgical history.  There were no vitals filed for this visit.  Subjective Assessment - 11/12/17 1345    Subjective  Patient reports that she is not able to lie on her left side. She continues to have shoulder pain. She reports that her balance is a little bit better     Pertinent History  She had a hughes flap to her eye right eye feb 18th at [facility]. She was in the hospital for a month from the first surgey and was discharged Dec 11th. She had PT and OT for  2 weeks and then HHPT PT, OT and ST.  Tha ended christmas. She was not using a Assistive devie prior to surgery. She has weakness in her arms and she is not able to walk her normal distances.     Limitations  Standing    How long can you stand comfortably?  10 mins    Patient Stated Goals  to be able to walk and stand for longer periods of time, and improve balance.     Currently in Pain?  Yes    Pain Score  6     Pain Location  Shoulder    Pain Orientation  Right;Left    Pain Descriptors / Indicators  Aching    Pain Onset  More than a month ago    Pain Onset  In the past 7 days       Neuromuscular  training:  Toe tapping 6 inch stool without UE assistfrom foam x 20 , cues for good postureues Tandem gait in // bars x 4 laps;CGA use due to increase postural sway but no LOB. Side stepping on blue foam balance beam x 5 lengths of the parallel bars;Patient needs occasional verbal cueingand CGAto maintain center of gravity during all dynamic standing balance activities.Patient required UE support for side stepping. Standing on 1/2 foam with flat side down and head turns x 20 left and right Resisted walking: 22.5# forward/backward, side/side  x3 laps each direction with CGA for safety and cues to improve step length for better balance challenge; Required cues to slow down eccentric return for better balance control;  Leg press with 100 lbs x 20 x 2; heel raises with 75 lbs 20 x 2    Patient tolerated session well; denies any painbut does report some fatigue at end of session  PT Education - 11/12/17 1346    Education provided  Yes    Education Details  HEP    Person(s) Educated  Patient    Methods  Explanation    Comprehension  Verbalized understanding;Returned demonstration;Verbal cues required       PT Short Term Goals - 09/23/17 1322      PT SHORT TERM GOAL #1   Title  Patient will be independent in home exercise program to improve strength/mobility for better functional independence with ADLs.    Baseline  Patient is doing well with her exercises    Time  4    Period  Weeks    Status  Achieved      PT SHORT TERM GOAL #2   Title  Patient (> 15 years old) will complete five times sit to stand test in < 15 seconds indicating an increased LE strength and improved balance.    Baseline  22.19 sec 07/31/17; 08/21/17: 19.5s    Time  4    Period  Weeks    Status  On-going        PT Long Term Goals - 11/07/17 1525      PT LONG TERM GOAL #1   Title  Patient will increase six minute walk test distance to >1000 for progression  to community ambulator and improve gait ability    Baseline  07/31/17 1085 feet; 08/21/17: 1005',     Time  8    Period  Weeks    Status  Achieved      PT LONG TERM GOAL #2   Title  Patient will increase BLE gross strength to 4+/5 as to improve functional strength for independent gait, increased standing tolerance and increased ADL ability.    Baseline  3+/5 BLE hips, 08/21/17: hip flex/ext/abd/add 4/5, hip IR/ER 4+/5, Knee ext: 5/5, Knee flex: 4/4, 09/23/17 knee flex 4+/5, ext 5/5    Time  8    Period  Weeks    Status  Achieved      PT LONG TERM GOAL #3   Title  Patient will ascend/descend 4 stairs without rail assist independently without loss of balance to improve ability to get in/out of home.     Baseline  Definite need of railings and slow and guarded; 08/21/17: Need for rail, decreased speed, guarded, 09/23/17 Need for rail, decreased speed, guarded    Time  8    Period  Weeks    Status  Partially Met    Target Date  12/11/17      PT LONG TERM GOAL #4   Title  Patient will reduce timed up and go to <11 seconds to reduce fall risk and demonstrate improved transfer/gait ability.    Baseline  19.87 sec 07/31/17; 08/21/17: 11.8 sec, 09/23/17=12.79 sec, 12/20 sec 10/22/17: 11/07/17=    Time  8    Period  Weeks    Status  Partially Met    Target Date  12/11/17      PT LONG TERM GOAL #5   Title  patient will be able to ascend and descend the steps and carry items without railing.     Time  8    Period  Weeks    Status  Partially Met    Target Date  12/11/17            Plan - 11/12/17 1348    Clinical Impression Statement  Patient instructed in intermediate balance/strengthening exercise. Patient fatigues quickly requiring short rest breaks in between  intermediate exercise. Patient instructed in advanced strengthening with increased repetition/resistance. Patient requires CGA for intermediate balance exercise especially with less rail assist. Patient would benefit from additional skilled  PT intervention to improve balance/gait safety and reduce fall risk.    Rehab Potential  Good    PT Frequency  2x / week    PT Duration  8 weeks    PT Treatment/Interventions  Gait training;Therapeutic exercise;Therapeutic activities;Stair training;Balance training;Neuromuscular re-education;Patient/family education;Manual techniques;Aquatic Therapy;Moist Heat;Electrical Stimulation    PT Next Visit Plan  balance and therapeutic exercise for hip weakness    Consulted and Agree with Plan of Care  Patient;Family member/caregiver       Patient will benefit from skilled therapeutic intervention in order to improve the following deficits and impairments:  Abnormal gait, Decreased balance, Decreased endurance, Decreased mobility, Difficulty walking, Decreased knowledge of precautions, Decreased activity tolerance, Decreased coordination, Decreased safety awareness, Decreased strength, Impaired flexibility, Postural dysfunction  Visit Diagnosis: Muscle weakness (generalized)  Other lack of coordination  Unsteadiness on feet  Difficulty in walking, not elsewhere classified  Low vision, both eyes     Problem List There are no active problems to display for this patient.   Arelia Sneddon S,PT DPT 11/12/2017, 1:49 PM  [place] MAIN [facility] SERVICES [address] [place], [place], [address] Phone: [phone]   Fax:  [phone]  Name: Haley Lucas MRN: [id number] Date of Birth: 1950-05-08

## 2017-11-12 NOTE — Therapy (Signed)
[place] [facility] [address] [place], [place], [address] Phone: [phone]   Fax:  [phone]  Occupational Therapy Treatment  Patient Details  Name: Haley Lucas MRN: [id number] Date of Birth: 02/20/1951 Referring Provider: Marshell GarfinkelO, SARAH J    Encounter Date: 11/12/2017  OT End of Session - 11/12/17 1653    Visit Number  27    Number of Visits  48    Date for OT Re-Evaluation  12/10/17    Authorization Type  Visit 7 of 10 for progress report period starting 10/15/2017    OT Start Time  1300    OT Stop Time  1345    OT Time Calculation (min)  45 min    Activity Tolerance  Patient tolerated treatment well    Behavior During Therapy  [employer] [facility] [employer] for tasks assessed/performed       Past Medical History:  Diagnosis Date  . High cholesterol   . Hypertension   . Thyroid disease     History reviewed. No pertinent surgical history.  There were no vitals filed for this visit.  Subjective Assessment - 11/12/17 1652    Subjective   Pt. reports doing better today.    Pertinent History  Pt is a 67 y.o. female who [facility]. Pt. received inpatient rehabilitation services followed by home health services. Pt. had surgery to repair a right drooping eyelid on February 19th., 2019. Pt. is ready for outpatient OT services.    Patient Stated Goals  Be independent    Currently in Pain?  Yes    Pain Score  7     Pain Location  Shoulder    Pain Orientation  Right;Left    Pain Descriptors / Indicators  Aching    Pain Onset  More than a month ago       OT TREATMENT  Therapeutic Exercise:  Pt.worked on AROM in all joint ranges of the LUEin sitting, for scapular elevation, depression, abduction, rotation, retraction, internal, and external rotationsupine and sitting.AROM with the left shoulder in supine for shoulder flexion, abduction, and protraction in supine. Pt.tolerated1.5#  dowelexinsupinefor shoulder flexion, chest press, and circular motionin supinefollowed byreps in sitting. Pt. Worked on bilateral elbow flexion extension with red theraband for 2 sets of 10 reps. Pt. Left shoulder ROM was 114 in sitting prior to there. Ex, improved to 124 following there. Ex.                       OT Education - 11/12/17 1308    Education provided  Yes    Education Details  ROM, and strengthening exercises    Person(s) Educated  Patient    Methods  Explanation;Demonstration;Tactile cues;Verbal cues    Comprehension  Verbalized understanding;Returned demonstration          OT Long Term Goals - 10/08/17 1744      OT LONG TERM GOAL #1   Title  Pt. will increase UE strength by 2 mm grades to assist with ADLs, and IADL    Baseline  10/08/2017: Pt. is progressing with ROM, strengthening N/A secondary to bilateral shoulder pain    Time  12    Period  Weeks    Status  On-going    Target Date  12/10/17      OT LONG TERM GOAL #2   Title  Pt. will improve right hand [facility] skills by 3 sec. to be able  to manipulate ADL items.    Baseline  10/08/2017:  Right: 21 sec., Left 25 sec.    Time  12    Period  Weeks    Status  On-going    Target Date  12/10/17      OT LONG TERM GOAL #3   Title  Pt. will demonstrate visual compensatory strategies 100% of the time during ADLs, and IADLs.    Baseline  09/17/2017: Pt. is improving    Time  12    Period  Weeks    Status  On-going    Target Date  12/10/17      OT LONG TERM GOAL #5   Title  Pt. will complete IADL, home management tasks with Supervision.     Baseline  10/08/2017: MinA    Time  12    Period  Weeks    Status  On-going    Target Date  12/10/17      OT LONG TERM GOAL #6   Title  Pt. will increase left shoulder ROM to be able to independently retrieve items from the cabinetry/colsets.    Baseline  10/08/2017: Pt. sitting AROM shoulder flexion: 108, abduction: 82, supine shoulder flexion: 125     Time  12    Period  Weeks    Status  On-going    Target Date  12/10/17            Plan - 11/12/17 1653    Clinical Impression Statement  Pt. reports that she has stopped doing wevrything that she used to enjoy doing before the surgery. Pt. reports that the right side of her face, and mouth are sensitive. Pt. presents with low vision. Reviewed pt. routines, and hobbies.Pt. is making progress with her LUE ROM. Pt. continues to work on improving UE functioning during ADLs, and IADL tasks.    Occupational Profile and client history currently impacting functional performance  Pt. 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    Occupational performance deficits (Please refer to evaluation for details):  ADL's;IADL's    Rehab Potential  Good    Current Impairments/barriers affecting progress:  Positive indicators: age, family support, motivation, Negative indicators: multiple comorbidities.    OT Frequency  2x / week    OT Duration  12 weeks    OT Treatment/Interventions  Self-care/ADL training;Neuromuscular education;Therapeutic activities;Cognitive remediation/compensation;Passive range of motion;DME and/or AE instruction;Patient/family education;Energy conservation;Therapeutic exercise;Manual Therapy    Clinical Decision Making  Several treatment options, min-mod task modification necessary    Consulted and Agree with Plan of Care  Patient       Patient will benefit from skilled therapeutic intervention in order to improve the following deficits and impairments:  Pain, Impaired UE functional use, Decreased knowledge of precautions, Decreased cognition, Impaired tone, Decreased strength, Decreased endurance, Decreased activity tolerance, Decreased knowledge of use of DME, Decreased balance  Visit Diagnosis: Muscle weakness (generalized)    Problem List There are no active problems to display for this patient.   [name], MS, OTR/L 11/12/2017, 4:59  PM  [place] [facility] MAIN [facility] SERVICES [address] [place], [place], [address] Phone: [phone]   Fax:  [phone]  Name: Haley Lucas MRN: [id number] Date of Birth: 1950-04-19

## 2017-11-14 ENCOUNTER — Ambulatory Visit: Payer: Medicare Other | Admitting: Occupational Therapy

## 2017-11-14 ENCOUNTER — Encounter: Payer: Self-pay | Admitting: Occupational Therapy

## 2017-11-14 ENCOUNTER — Encounter: Payer: Self-pay | Admitting: Physical Therapy

## 2017-11-14 ENCOUNTER — Ambulatory Visit: Payer: Medicare Other | Attending: Family Medicine | Admitting: Physical Therapy

## 2017-11-14 DIAGNOSIS — R2681 Unsteadiness on feet: Secondary | ICD-10-CM | POA: Diagnosis present

## 2017-11-14 DIAGNOSIS — M6281 Muscle weakness (generalized): Secondary | ICD-10-CM

## 2017-11-14 DIAGNOSIS — H543 Unqualified visual loss, both eyes: Secondary | ICD-10-CM

## 2017-11-14 DIAGNOSIS — R278 Other lack of coordination: Secondary | ICD-10-CM

## 2017-11-14 DIAGNOSIS — R262 Difficulty in walking, not elsewhere classified: Secondary | ICD-10-CM | POA: Diagnosis present

## 2017-11-14 NOTE — Therapy (Signed)
[place] MAIN [facility] SERVICES [address] [place], [place], [address] Phone: [phone]   Fax:  [phone]  Occupational Therapy Progress Note  Dates of reporting period  10/15/2017  to   11/14/2017  Patient Details  Name: Haley Lucas MRN: [id number] Date of Birth: 28-Mar-1951 Referring Provider: [name]    Encounter Date: 11/14/2017  OT End of Session - 11/14/17 1622    Visit Number  28    Number of Visits  33    Date for OT Re-Evaluation  12/10/17    Authorization Type  (Progress report completed on 11/14/2017, Start visit 1/10 next visit) Visit 8 of 10 for progress report period starting 10/15/2017    OT Start Time  1600    OT Stop Time  1645    OT Time Calculation (min)  45 min    Activity Tolerance  Patient tolerated treatment well    Behavior During Therapy  [facility] for tasks assessed/performed       Past Medical History:  Diagnosis Date  . High cholesterol   . Hypertension   . Thyroid disease     History reviewed. No pertinent surgical history.  There were no vitals filed for this visit.  Subjective Assessment - 11/14/17 1609    Subjective   Pt. reports doing better today.    Pertinent History  Pt is a 67 y.o. 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 Pt. received inpatient rehabilitation services followed by home health services. Pt. had surgery to repair a right drooping eyelid on February 19th., 2019. Pt. is ready for outpatient OT services.    Patient Stated Goals  Be independent    Currently in Pain?  Yes    Pain Score  7     Pain Orientation  Right;Left    Pain Descriptors / Indicators  Aching    Pain Type  Chronic pain    Pain Onset  More than a month ago       OT TREATMENT  Therapeutic Exercise:  Pt.worked on AROM in all joint ranges of the LUEin sitting, for scapular elevation, depression, abduction, rotation, retraction, internal, and external rotationsupine and  sitting.AROM with the left shoulder in supine for shoulder flexion, abduction, and protraction in supine. Pt.tolerated1.5# dowelexinsupinefor shoulder flexion, chest press, and circular motionin supinefollowed byreps in sitting. Pt. Worked on bilateral elbow flexion extension with red theraband for 2 sets of 10 reps. Pt. Left shoulder ROM was 113 in sitting prior to there. Ex, improved to 125 following there. Ex.   Self-care:   Pt. Worked on reaching various shelf heights in the [employer]. Pt. Education was provided about work simplification strategies with the kitchen for placement of commonly used items.                        OT Education - 11/14/17 1622    Education provided  Yes    Education Details  ROM, and strengthening exercises    Person(s) Educated  Patient    Methods  Explanation;Demonstration;Tactile cues;Verbal cues    Comprehension  Verbalized understanding;Returned demonstration          OT Long Term Goals - 11/14/17 1630      OT LONG TERM GOAL #1   Title  Pt. will increase UE strength by 2 mm grades to assist with ADLs, and IADL    Baseline  11/14/2017: Pt. is progressing with ROM,  and light strengthening.    Time  12    Period  Weeks    Status  On-going    Target Date  12/10/17      OT LONG TERM GOAL #2   Title  Pt. will improve right hand [facility] skills by 3 sec. to be able to manipulate ADL items.    Baseline  Pt. is now able to manipulate ADL, and IADL items independently    Time  12    Period  Weeks    Status  Partially Met. Goal discontinued.     OT LONG TERM GOAL #3   Title  Pt. will demonstrate visual compensatory strategies 100% of the time during ADLs, and IADLs.    Baseline  11/14/2017: Pt. plans to return to the her eye physician next week, and have something removed from her eye following the surgery. Pt. continues to prsnet with low vision.    Time  12    Period  Weeks    Status  On-going    Target Date   12/10/17      OT LONG TERM GOAL #5   Title  Pt. will complete IADL, home management tasks with Supervision.     Baseline  11/14/2017: MinA    Time  12    Period  Weeks    Status  On-going    Target Date  12/10/17      OT LONG TERM GOAL #6   Title  Pt. will increase left shoulder ROM to be able to independently retrieve items from the cabinetry/closets.    Baseline  10/08/2017: Pt. sitting AROM left shoulder flexion: 113 in sitting before session, 125 at the end of the session.    Time  12    Period  Weeks    Status  On-going    Target Date  12/10/17            Plan - 11/14/17 1623    Clinical Impression Statement  Pt. has a follow-up appointment next Thursday with her eye doctor. Pt. reports they need to remove something following her surgery. Pt. reports that her left shoulder is much better. Pt. continues to make progress with her LUE ROM, strength, and tolerance for activity. Pt. continues to work on improving left UE ROM, and strength to be able to reach into cabinetry, and claosets, and perfrom ADL, and IADL functioning.    Occupational Profile and client history currently impacting functional performance  Pt. is married, has grown children, and was running a small business cooking. Goals were reviewed with the pt.   Occupational performance deficits (Please refer to evaluation for details):  ADL's;IADL's    Rehab Potential  Good    Current Impairments/barriers affecting progress:  Positive indicators: age, family support, motivation, Negative indicators: multiple comorbidities.    OT Frequency  2x / week    OT Duration  12 weeks    OT Treatment/Interventions  Self-care/ADL training;Neuromuscular education;Therapeutic activities;Cognitive remediation/compensation;Passive range of motion;DME and/or AE instruction;Patient/family education;Energy conservation;Therapeutic exercise;Manual Therapy    Clinical Decision Making  Several treatment options, min-mod task modification  necessary    Consulted and Agree with Plan of Care  Patient       Patient will benefit from skilled therapeutic intervention in order to improve the following deficits and impairments:  Pain, Impaired UE functional use, Decreased knowledge of precautions, Decreased cognition, Impaired tone, Decreased strength, Decreased endurance, Decreased activity tolerance, Decreased knowledge of use of DME, Decreased balance  Visit Diagnosis: Muscle  weakness (generalized)    Problem List There are no active problems to display for this patient.   Harrel Carina, MS, OTR/L 11/14/2017, 5:06 PM  [place] MAIN [facility] SERVICES [address] [place], [place], [address] Phone: [phone]   Fax:  [phone]  Name: Haley Lucas MRN: [id number] Date of Birth: 1951-02-16

## 2017-11-14 NOTE — Therapy (Signed)
[place] MAIN [facility] SERVICES [address] [place], [place], [address] Phone: [phone]   Fax:  [phone]  Physical Therapy Treatment  Patient Details  Name: Haley Lucas MRN: [id number] Date of Birth: 1951-03-27 Referring Provider: [name]    Encounter Date: 11/14/2017  PT End of Session - 11/14/17 1512    Visit Number  32    Number of Visits  49    Date for PT Re-Evaluation  12/11/17    PT Start Time  0320    PT Stop Time  0400    PT Time Calculation (min)  40 min    Equipment Utilized During Treatment  Gait belt    Activity Tolerance  Patient tolerated treatment well    Behavior During Therapy  [facility] for tasks assessed/performed       Past Medical History:  Diagnosis Date  . High cholesterol   . Hypertension   . Thyroid disease     History reviewed. No pertinent surgical history.  There were no vitals filed for this visit.  Subjective Assessment - 11/14/17 1605    Subjective  Patient reports that she is not able to lie on her left side. She continues to have shoulder pain. She reports that her balance is a little bit better     Pertinent History  She had a hughes flap to her eye right eye feb 18th at [facility]. She was in the hospital for a month from the first surgey and was discharged Dec 11th. She had PT and OT for  2 weeks and then HHPT PT, OT and ST.  Tha ended christmas. She was not using a Assistive devie prior to surgery. She has weakness in her arms and she is not able to walk her normal distances.     Limitations  Standing    How long can you stand comfortably?  10 mins    Patient Stated Goals  to be able to walk and stand for longer periods of time, and improve balance.     Currently in Pain?  No/denies    Pain Score  7     Pain Location  Shoulder    Pain Orientation  Right;Left    Pain Descriptors / Indicators  Aching    Pain Onset  More than a month ago    Aggravating Factors   sleeping    Pain Relieving  Factors  nothing    Effect of Pain on Daily Activities  difficult to do over head activities    Multiple Pain Sites  No    Pain Onset  In the past 7 days      Therapeutic exercise and neuromuscular training: 1/2 foam flat side up and balance with head turns left and right feet apart and feet together,cues for better posture Side stepping on blue balance beam left and right x 10 lengths, cues for going slowly and she ups from floor to 6 inch stool x 20 bilateral marching in parallel bars x 20, cues for bigger steps needs UE support with LOB backwards  tandem standing on 1/2 foam  , cues for better posture  standing hip abd with YTB x 20  , cues to keep her shoulders upright  side stepping left and right YTB in parallel bars 10 feet x 3 step  Tilt board fwd/bwd, side to side left and right, needs UE support Stepping over bolster left and right and fwd/bwd, needs UE support with LOB backwards Tm  walking 1. 5 m/hour x 5 mins, cues for long steps and standing posture correction  CGA and Min to mod verbal cues used throughout with increased in postural sway and LOB most seen with narrow base of support and while on uneven surfaces. Continues to have balance deficits typical with diagnosis. Patient performs intermediate level exercises without pain behaviors and needs verbal cuing for postural alignment and head positioning                      PT Education - 11/14/17 1512    Education provided  Yes    Education Details  HEP    Person(s) Educated  Patient    Methods  Explanation    Comprehension  Verbalized understanding       PT Short Term Goals - 09/23/17 1322      PT SHORT TERM GOAL #1   Title  Patient will be independent in home exercise program to improve strength/mobility for better functional independence with ADLs.    Baseline  Patient is doing well with her exercises    Time  4    Period  Weeks    Status  Achieved      PT SHORT TERM GOAL #2   Title  Patient  (> 5 years old) will complete five times sit to stand test in < 15 seconds indicating an increased LE strength and improved balance.    Baseline  22.19 sec 07/31/17; 08/21/17: 19.5s    Time  4    Period  Weeks    Status  On-going        PT Long Term Goals - 11/07/17 1525      PT LONG TERM GOAL #1   Title  Patient will increase six minute walk test distance to >1000 for progression to community ambulator and improve gait ability    Baseline  07/31/17 1085 feet; 08/21/17: 1005',     Time  8    Period  Weeks    Status  Achieved      PT LONG TERM GOAL #2   Title  Patient will increase BLE gross strength to 4+/5 as to improve functional strength for independent gait, increased standing tolerance and increased ADL ability.    Baseline  3+/5 BLE hips, 08/21/17: hip flex/ext/abd/add 4/5, hip IR/ER 4+/5, Knee ext: 5/5, Knee flex: 4/4, 09/23/17 knee flex 4+/5, ext 5/5    Time  8    Period  Weeks    Status  Achieved      PT LONG TERM GOAL #3   Title  Patient will ascend/descend 4 stairs without rail assist independently without loss of balance to improve ability to get in/out of home.     Baseline  Definite need of railings and slow and guarded; 08/21/17: Need for rail, decreased speed, guarded, 09/23/17 Need for rail, decreased speed, guarded    Time  8    Period  Weeks    Status  Partially Met    Target Date  12/11/17      PT LONG TERM GOAL #4   Title  Patient will reduce timed up and go to <11 seconds to reduce fall risk and demonstrate improved transfer/gait ability.    Baseline  19.87 sec 07/31/17; 08/21/17: 11.8 sec, 09/23/17=12.79 sec, 12/20 sec 10/22/17: 11/07/17=    Time  8    Period  Weeks    Status  Partially Met    Target Date  12/11/17  PT LONG TERM GOAL #5   Title  patient will be able to ascend and descend the steps and carry items without railing.     Time  8    Period  Weeks    Status  Partially Met    Target Date  12/11/17            Plan - 11/14/17 1515    Clinical  Impression Statement  Pt was able to progress dynamic balance exercises today, noting improved postural reactions with LOB and moving outside normal BOS.  Pt was able to perform all exercises on uneven surfaces with minimal LOB noted.  Single limb stability activities were performed today, with decrease control noted when attempting to perform tasks with unstable surface and reaching.   Pt would continue to benefit from skilled therapy services to address further balance impairments and decrease falls risk    Rehab Potential  Good    PT Frequency  2x / week    PT Duration  8 weeks    PT Treatment/Interventions  Gait training;Therapeutic exercise;Therapeutic activities;Stair training;Balance training;Neuromuscular re-education;Patient/family education;Manual techniques;Aquatic Therapy;Moist Heat;Electrical Stimulation    PT Next Visit Plan  balance and therapeutic exercise for hip weakness    Consulted and Agree with Plan of Care  Patient;Family member/caregiver       Patient will benefit from skilled therapeutic intervention in order to improve the following deficits and impairments:  Abnormal gait, Decreased balance, Decreased endurance, Decreased mobility, Difficulty walking, Decreased knowledge of precautions, Decreased activity tolerance, Decreased coordination, Decreased safety awareness, Decreased strength, Impaired flexibility, Postural dysfunction  Visit Diagnosis: Muscle weakness (generalized)  Other lack of coordination  Unsteadiness on feet  Difficulty in walking, not elsewhere classified  Low vision, both eyes     Problem List There are no active problems to display for this patient.   [address] [place] DPT 11/14/2017, 4:06 PM  [place] MAIN [facility] SERVICES [address] [place], [place], [address] Phone: [phone]   Fax:  [phone]  Name: Haley Lucas MRN: [id number] Date of Birth: [birth year]

## 2017-11-19 ENCOUNTER — Encounter: Payer: Self-pay | Admitting: Occupational Therapy

## 2017-11-19 ENCOUNTER — Ambulatory Visit: Payer: Medicare Other | Admitting: Physical Therapy

## 2017-11-19 ENCOUNTER — Encounter: Payer: Self-pay | Admitting: Physical Therapy

## 2017-11-19 ENCOUNTER — Ambulatory Visit: Payer: Medicare Other | Admitting: Occupational Therapy

## 2017-11-19 DIAGNOSIS — R278 Other lack of coordination: Secondary | ICD-10-CM

## 2017-11-19 DIAGNOSIS — R262 Difficulty in walking, not elsewhere classified: Secondary | ICD-10-CM

## 2017-11-19 DIAGNOSIS — M6281 Muscle weakness (generalized): Secondary | ICD-10-CM | POA: Diagnosis not present

## 2017-11-19 DIAGNOSIS — R2681 Unsteadiness on feet: Secondary | ICD-10-CM

## 2017-11-19 NOTE — Therapy (Signed)
[place] MAIN [facility] SERVICES [address] [place], [place], [address] Phone: [phone]   Fax:  [phone]  Physical Therapy Treatment  Patient Details  Name: Haley Lucas MRN: [id number] Date of Birth: [birth year] Referring Provider: [name]    Encounter Date: 11/19/2017  PT End of Session - 11/19/17 1532    Visit Number  33    Number of Visits  49    Date for PT Re-Evaluation  12/11/17    PT Start Time  0315    PT Stop Time  0400    PT Time Calculation (min)  45 min    Equipment Utilized During Treatment  Gait belt    Activity Tolerance  Patient tolerated treatment well    Behavior During Therapy  [facility] for tasks assessed/performed       Past Medical History:  Diagnosis Date  . High cholesterol   . Hypertension   . Thyroid disease     History reviewed. No pertinent surgical history.  There were no vitals filed for this visit.  Subjective Assessment - 11/19/17 1531    Subjective  Patient reports that she is not able to lie on her left side. She continues to have shoulder pain. She reports that her balance is a little bit better     Pertinent History  She had a hughes flap to her eye right eye feb 18th at [facility]. She was in the hospital for a month from the first surgey and was discharged Dec 11th. She had PT and OT for  2 weeks and then HHPT PT, OT and ST.  Tha ended christmas. She was not using a Assistive devie prior to surgery. She has weakness in her arms and she is not able to walk her normal distances.     Limitations  Standing    How long can you stand comfortably?  10 mins    Patient Stated Goals  to be able to walk and stand for longer periods of time, and improve balance.     Currently in Pain?  Yes    Pain Score  7     Pain Location  Shoulder    Pain Orientation  Right;Left    Pain Descriptors / Indicators  Aching    Pain Type  Chronic pain    Pain Onset  More than a month ago    Pain Frequency  Constant     Aggravating Factors   sleeping on her side    Pain Relieving Factors  nothing    Effect of Pain on Daily Activities  difficult to do them    Multiple Pain Sites  No    Pain Onset  In the past 7 days       Neuromuscular training: Tandem standing on 1/2 foam and lateral tapping left and right to stepping stone x 10 with UE support intermiteent 50%  Toe tapping 6 inch stool without UE assistfrom foam x 20 , cues for good postureues, 25%  Loss of balance and use of UE  Tandem gait in // bars x 4 laps;CGA use due to increase postural sway but no LOB 10% UE support due to loss of balance  Side stepping on blue foam balance beam x 5 lengths of the parallel bars;Patient needs occasional verbal cueingand CGAto maintain center of gravity during all dynamic standing balance activities.Patient required UE support 30% for side stepping.  Standing on 1/2 foam with flat side down and head turns  x 20 left and right, 5 % UE support for loss of balance  Resisted walking: 22.5# forward/backward, side/side x3laps each direction with CGA for safety and cues to improve step length for better balance challenge; Required cues to slow down eccentric return for better balance control;  Leg press with 90 lbs x 20 x 2; heel raises with 60 lbs 20 x 2   Patient tolerated session well; denies any painbut does report some fatigue at end of session                        PT Education - 11/19/17 1532    Education provided  Yes    Education Details  HEP    Person(s) Educated  Patient    Methods  Explanation;Demonstration    Comprehension  Verbalized understanding;Returned demonstration       PT Short Term Goals - 09/23/17 1322      PT SHORT TERM GOAL #1   Title  Patient will be independent in home exercise program to improve strength/mobility for better functional independence with ADLs.    Baseline  Patient is doing well with her exercises    Time  4    Period  Weeks     Status  Achieved      PT SHORT TERM GOAL #2   Title  Patient (> 74 years old) will complete five times sit to stand test in < 15 seconds indicating an increased LE strength and improved balance.    Baseline  22.19 sec 07/31/17; 08/21/17: 19.5s    Time  4    Period  Weeks    Status  On-going        PT Long Term Goals - 11/07/17 1525      PT LONG TERM GOAL #1   Title  Patient will increase six minute walk test distance to >1000 for progression to community ambulator and improve gait ability    Baseline  07/31/17 1085 feet; 08/21/17: 1005',     Time  8    Period  Weeks    Status  Achieved      PT LONG TERM GOAL #2   Title  Patient will increase BLE gross strength to 4+/5 as to improve functional strength for independent gait, increased standing tolerance and increased ADL ability.    Baseline  3+/5 BLE hips, 08/21/17: hip flex/ext/abd/add 4/5, hip IR/ER 4+/5, Knee ext: 5/5, Knee flex: 4/4, 09/23/17 knee flex 4+/5, ext 5/5    Time  8    Period  Weeks    Status  Achieved      PT LONG TERM GOAL #3   Title  Patient will ascend/descend 4 stairs without rail assist independently without loss of balance to improve ability to get in/out of home.     Baseline  Definite need of railings and slow and guarded; 08/21/17: Need for rail, decreased speed, guarded, 09/23/17 Need for rail, decreased speed, guarded    Time  8    Period  Weeks    Status  Partially Met    Target Date  12/11/17      PT LONG TERM GOAL #4   Title  Patient will reduce timed up and go to <11 seconds to reduce fall risk and demonstrate improved transfer/gait ability.    Baseline  19.87 sec 07/31/17; 08/21/17: 11.8 sec, 09/23/17=12.79 sec, 12/20 sec 10/22/17: 11/07/17=    Time  8    Period  Weeks    Status  Partially Met    Target Date  12/11/17      PT LONG TERM GOAL #5   Title  patient will be able to ascend and descend the steps and carry items without railing.     Time  8    Period  Weeks    Status  Partially Met    Target  Date  12/11/17            Plan - 11/19/17 1533    Clinical Impression Statement  Pt presents with unsteadiness on uneven surfaces and fatigues with therapeutic exercises. Patient needs assist with uneven surfaces and narrow base of support and needs CGA assist with xxx activities. Patient demonstrates difficulty with dynamic standing balance and reaching out of base if support and increased challenges for UE. Patient tolerated all interventions well this date and will benefit from continued skilled PT interventions to improve strength and balance and decrease risk of falling    Rehab Potential  Good    PT Frequency  2x / week    PT Duration  8 weeks    PT Treatment/Interventions  Gait training;Therapeutic exercise;Therapeutic activities;Stair training;Balance training;Neuromuscular re-education;Patient/family education;Manual techniques;Aquatic Therapy;Moist Heat;Electrical Stimulation    PT Next Visit Plan  balance and therapeutic exercise for hip weakness    Consulted and Agree with Plan of Care  Patient;Family member/caregiver       Patient will benefit from skilled therapeutic intervention in order to improve the following deficits and impairments:  Abnormal gait, Decreased balance, Decreased endurance, Decreased mobility, Difficulty walking, Decreased knowledge of precautions, Decreased activity tolerance, Decreased coordination, Decreased safety awareness, Decreased strength, Impaired flexibility, Postural dysfunction  Visit Diagnosis: Muscle weakness (generalized)  Other lack of coordination  Unsteadiness on feet  Difficulty in walking, not elsewhere classified     Problem List There are no active problems to display for this patient.   [address], [place] DPT 11/19/2017, 3:36 PM  [place] MAIN [facility] SERVICES [address] [place], [place], [address] Phone: [phone]   Fax:  [phone]  Name: Haley Lucas MRN:  [id number] Date of Birth: Apr 07, 1951

## 2017-11-19 NOTE — Therapy (Signed)
[place] MAIN [facility] SERVICES [address] [place], [place], [address] Phone: [phone]   Fax:  [phone]  Occupational Therapy Treatment  Patient Details  Name: Haley Lucas MRN: [id number] Date of Birth: [birth year] Referring Provider: [name]    Encounter Date: 11/19/2017  OT End of Session - 11/19/17 1537    Visit Number  29    Number of Visits  53    Date for OT Re-Evaluation  12/10/17    Authorization Type  (Progress report completed on 11/14/2017, Start visit 1/10 next visit) Visit 8 of 10 for progress report period starting 10/15/2017    OT Start Time  1430    OT Stop Time  1515    OT Time Calculation (min)  45 min    Activity Tolerance  Patient tolerated treatment well    Behavior During Therapy  [facility] for tasks assessed/performed       Past Medical History:  Diagnosis Date  . High cholesterol   . Hypertension   . Thyroid disease     History reviewed. No pertinent surgical history.  There were no vitals filed for this visit.  Subjective Assessment - 11/19/17 1534    Subjective   Pt reports that she has an eye doctor for her R eye this Thursday and possible injections for her shoulder pain next Frday the 19th.    Patient is accompained by:  Family member    Pertinent History  Pt is a 68 y.o. 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 Pt. received inpatient rehabilitation services followed by home health services. Pt. had surgery to repair a right drooping eyelid on February 19th., 2019. Pt. is ready for outpatient OT services.    Patient Stated Goals  Be independent    Currently in Pain?  Yes    Pain Score  7     Pain Location  Shoulder    Pain Orientation  Right;Left    Pain Descriptors / Indicators  Aching    Pain Radiating Towards  NA    Pain Onset  More than a month ago    Pain Frequency  Constant    Aggravating Factors   sleeping on her side or putting arms together with elbows bent    Pain Relieving Factors  nothing    Effect of Pain on Daily Activities  needs rest breaks    Multiple Pain Sites  No       Therapeutic  exercise:     Pt seen for BUE exercises after stretching.  She participated in Insurance risk surveyor for 8 minutes at Level 4 alternating every 2 minutes for forward and  Backwards with good stamina.       Pt participated in finger ladder and able to reach to 20 on right and 18 for left with forward flexion and to 16 on R and L when standing perpendicular to finger ladder with no pain.      Good progress with being able to catch, throw and bounce large green therapy ball while sitting with no increased pain.  3 sets of 10 for 2# dumb bells for chest press, elbow flexion and extension to shoulder height.                       OT Education - 11/19/17 1537    Education provided  Yes    Education Details  ROM, and strengthening exercises    Person(s)  Educated  Patient    Methods  Explanation;Demonstration;Tactile cues;Verbal cues    Comprehension  Verbalized understanding;Returned demonstration          OT Long Term Goals - 11/14/17 1630      OT LONG TERM GOAL #1   Title  Pt. will increase UE strength by 2 mm grades to assist with ADLs, and IADL    Baseline  11/14/2017: Pt. is progressing with ROM,  and light strengthening.    Time  12    Period  Weeks    Status  On-going    Target Date  12/10/17      OT LONG TERM GOAL #2   Title  Pt. will improve right hand [facility] skills by 3 sec. to be able to manipulate ADL items.    Baseline  Pt. is now able to manipulate ADL, and IADL items independently    Time  12    Period  Weeks    Status  Partially Met      OT LONG TERM GOAL #3   Title  Pt. will demonstrate visual compensatory strategies 100% of the time during ADLs, and IADLs.    Baseline  11/14/2017: Pt. plans to return to the her eye physician next week, and have something removed from her eye following the surgery. Pt. continues to prsnet with low  vision.    Time  12    Period  Weeks    Status  On-going    Target Date  12/10/17      OT LONG TERM GOAL #5   Title  Pt. will complete IADL, home management tasks with Supervision.     Baseline  11/14/2017: MinA    Time  12    Period  Weeks    Status  On-going    Target Date  12/10/17      OT LONG TERM GOAL #6   Title  Pt. will increase left shoulder ROM to be able to independently retrieve items from the cabinetry/closets.    Baseline  10/08/2017: Pt. sitting AROM left shoulder flexion: 113 in sitting before session, 125 at the end of the session.    Time  12    Period  Weeks    Status  On-going    Target Date  12/10/17              Patient will benefit from skilled therapeutic intervention in order to improve the following deficits and impairments:     Visit Diagnosis: Muscle weakness (generalized)  Other lack of coordination    Problem List There are no active problems to display for this patient.   Chrys Racer, OTR/L ascom [phone] 11/19/17, 3:47 PM  [place] MAIN [facility] SERVICES [address] [place], [place], [address] Phone: [phone]   Fax:  [phone]  Name: Haley Lucas MRN: [id number] Date of Birth: [birth year]

## 2017-11-21 ENCOUNTER — Encounter: Payer: Medicare Other | Admitting: Occupational Therapy

## 2017-11-21 ENCOUNTER — Ambulatory Visit: Payer: Medicare Other | Admitting: Physical Therapy

## 2017-11-26 ENCOUNTER — Ambulatory Visit: Payer: Medicare Other | Admitting: Occupational Therapy

## 2017-11-26 ENCOUNTER — Ambulatory Visit: Payer: Medicare Other | Admitting: Physical Therapy

## 2017-11-28 ENCOUNTER — Encounter: Payer: Self-pay | Admitting: Physical Therapy

## 2017-11-28 ENCOUNTER — Ambulatory Visit: Payer: Medicare Other | Admitting: Occupational Therapy

## 2017-11-28 ENCOUNTER — Ambulatory Visit: Payer: Medicare Other | Admitting: Physical Therapy

## 2017-11-28 DIAGNOSIS — R262 Difficulty in walking, not elsewhere classified: Secondary | ICD-10-CM

## 2017-11-28 DIAGNOSIS — M6281 Muscle weakness (generalized): Secondary | ICD-10-CM | POA: Diagnosis not present

## 2017-11-28 DIAGNOSIS — R278 Other lack of coordination: Secondary | ICD-10-CM

## 2017-11-28 DIAGNOSIS — R2681 Unsteadiness on feet: Secondary | ICD-10-CM

## 2017-11-28 DIAGNOSIS — H543 Unqualified visual loss, both eyes: Secondary | ICD-10-CM

## 2017-11-28 NOTE — Therapy (Signed)
[place] MAIN [facility] SERVICES [address] [place], [place], [address] Phone: [phone]   Fax:  [phone]  Occupational Therapy Treatment  Patient Details  Name: Haley Lucas MRN: [id number] Date of Birth: Sep 06, 1950 Referring Provider: Janalyn Lucas    Encounter Date: 11/28/2017  OT End of Session - 11/28/17 1827    Visit Number  30    Number of Visits  11    Date for OT Re-Evaluation  12/10/17    Authorization Type  Visit 2 of 10 for progress report period starting 11/19/2017    OT Start Time  1433    OT Stop Time  1513    OT Time Calculation (min)  40 min    Activity Tolerance  Patient tolerated treatment well    Behavior During Therapy  [facility] for tasks assessed/performed       Past Medical History:  Diagnosis Date  . High cholesterol   . Hypertension   . Thyroid disease     No past surgical history on file.  There were no vitals filed for this visit.  Subjective Assessment - 11/28/17 1826    Subjective   Pt. reports that she is doing well today.    Patient is accompained by:  Family member    Pertinent History  Pt is a 67 y.o. 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 Pt. received inpatient rehabilitation services followed by home health services. Pt. had surgery to repair a right drooping eyelid on February 19th., 2019. Pt. is ready for outpatient OT services.    Patient Stated Goals  Be independent    Currently in Pain?  Yes    Pain Score  7     Pain Location  Shoulder    Pain Orientation  Right;Left    Pain Descriptors / Indicators  Aching      OT TREATMENT    Therapeutic Exercise:  Pt.worked on AROM in all joint ranges of the LUEin sitting, for scapular elevation, depression, abduction, rotation, retraction, internal, and external rotationsupine and sitting.AROM with the left shoulder in supine for shoulder flexion, abduction, and protraction in supine. Pt.tolerated2#  dowelexinsupinefor shoulder flexion, chest press, and circular motionin supinefollowed byreps in sitting. Pt. left shoulder ROM was 112 in sitting prior to there. ex, improved to 124 following there. ex.Pt. worked on the [employer] for 8 min. with constant monitoring of the BUEs. Pt. worked on functional reaching with the LUE placing hanging hangers in the closet.                         OT Education - 11/28/17 1827    Education provided  Yes    Education Details  ROM, and strengthening exercises    Person(s) Educated  Patient    Methods  Explanation;Demonstration;Tactile cues;Verbal cues    Comprehension  Verbalized understanding;Returned demonstration          OT Long Term Goals - 11/14/17 1630      OT LONG TERM GOAL #1   Title  Pt. will increase UE strength by 2 mm grades to assist with ADLs, and IADL    Baseline  11/14/2017: Pt. is progressing with ROM,  and light strengthening.    Time  12    Period  Weeks    Status  On-going    Target Date  12/10/17      OT LONG TERM GOAL #2   Title  Pt. will improve right hand [facility] skills by 3 sec. to be able to manipulate ADL items.    Baseline  Pt. is now able to manipulate ADL, and IADL items independently    Time  12    Period  Weeks    Status  Partially Met      OT LONG TERM GOAL #3   Title  Pt. will demonstrate visual compensatory strategies 100% of the time during ADLs, and IADLs.    Baseline  11/14/2017: Pt. plans to return to the her eye physician next week, and have something removed from her eye following the surgery. Pt. continues to prsnet with low vision.    Time  12    Period  Weeks    Status  On-going    Target Date  12/10/17      OT LONG TERM GOAL #5   Title  Pt. will complete IADL, home management tasks with Supervision.     Baseline  11/14/2017: MinA    Time  12    Period  Weeks    Status  On-going    Target Date  12/10/17      OT LONG TERM GOAL #6   Title  Pt. will increase left  shoulder ROM to be able to independently retrieve items from the cabinetry/closets.    Baseline  10/08/2017: Pt. sitting AROM left shoulder flexion: 113 in sitting before session, 125 at the end of the session.    Time  12    Period  Weeks    Status  On-going    Target Date  12/10/17            Plan - 11/28/17 1828    Clinical Impression Statement  Pt. reports that she is now able to blink, and close her right eye now following her appointment with her eye surgeon. Pt. has a follow-up appointment with her physician tomorrow, and is planning to have injections in her left shoulder. Pt. is making steady progress, however continues to work on improving left UE ROM, and functional reaching during ADLs, and IADLs.    Occupational Profile and client history currently impacting functional performance  Pt. is married, has grown children, and was running a small business cooking.    Occupational performance deficits (Please refer to evaluation for details):  ADL's;IADL's    Rehab Potential  Good    Current Impairments/barriers affecting progress:  Positive indicators: age, family support, motivation, Negative indicators: multiple comorbidities.    OT Frequency  2x / week    OT Duration  12 weeks    OT Treatment/Interventions  Self-care/ADL training;Neuromuscular education;Therapeutic activities;Cognitive remediation/compensation;Passive range of motion;DME and/or AE instruction;Patient/family education;Energy conservation;Therapeutic exercise;Manual Therapy    Clinical Decision Making  Several treatment options, min-mod task modification necessary    Consulted and Agree with Plan of Care  Patient       Patient will benefit from skilled therapeutic intervention in order to improve the following deficits and impairments:  Pain, Impaired UE functional use, Decreased knowledge of precautions, Decreased cognition, Impaired tone, Decreased strength, Decreased endurance, Decreased activity tolerance,  Decreased knowledge of use of DME, Decreased balance  Visit Diagnosis: Muscle weakness (generalized)  Other lack of coordination    Problem List There are no active problems to display for this patient.   Haley Carina, MS, OTR/L 11/28/2017, 6:35 PM  [place] MAIN [facility] SERVICES [address] [place], [place], [address] Phone: [phone]   Fax:  [phone]  Name: KEILA TURAN MRN: [id number] Date of Birth: 02-28-1951

## 2017-11-28 NOTE — Therapy (Signed)
[place] MAIN [facility] SERVICES [address] [place], [place], [address] Phone: [phone]   Fax:  [phone]  Physical Therapy Treatment  Patient Details  Name: Haley Lucas MRN: [id number] Date of Birth: 07-31-1950 Referring Provider: [name]    Encounter Date: 11/28/2017  PT End of Session - 11/28/17 1531    Visit Number  36    Number of Visits  49    Date for PT Re-Evaluation  12/11/17    PT Start Time  0315    PT Stop Time  0400    PT Time Calculation (min)  45 min    Equipment Utilized During Treatment  Gait belt    Activity Tolerance  Patient tolerated treatment well    Behavior During Therapy  [facility] for tasks assessed/performed       Past Medical History:  Diagnosis Date  . High cholesterol   . Hypertension   . Thyroid disease     History reviewed. No pertinent surgical history.  There were no vitals filed for this visit.  Subjective Assessment - 11/28/17 1530    Subjective  Patient reports that she is not able to lie on her left side. She continues to have shoulder pain. She reports that her balance is a little bit better     Pertinent History  She had a hughes flap to her eye right eye feb 18th at [facility]. She was in the hospital for a month from the first surgey and was discharged Dec 11th. She had PT and OT for  2 weeks and then HHPT PT, OT and ST.  Tha ended christmas. She was not using a Assistive devie prior to surgery. She has weakness in her arms and she is not able to walk her normal distances.     Limitations  Standing    How long can you stand comfortably?  10 mins    Patient Stated Goals  to be able to walk and stand for longer periods of time, and improve balance.     Pain Score  7     Pain Location  Shoulder    Pain Orientation  Right;Left    Pain Descriptors / Indicators  Aching    Pain Type  Chronic pain    Pain Onset  More than a month ago    Pain Onset  In the past 7 days       Neuromuscular  training:    Toe tapping 6 inch stool without UE assistfrom foam x 20 , cues for good postureues  Tandem gait in // bars x 4 laps;CGA use due to increase postural sway but no LOB.  Side stepping on blue foam balance beam x 5 lengths of the parallel bars;Patient needs occasional verbal cueingand CGAto maintain center of gravity during all dynamic standing balance activities.Patient required UE support for side stepping.  Standing on 1/2 foam with flat side down and head turns x 20 left and right  Resisted walking: 22.5# forward/backward, side/side over step  x3laps each direction with CGA for safety and cues to improve step length for better balance challenge; Required cues to slow down eccentric return for better balance control;  Leg press with 100 lbs x 20 x 2; heel raises with 75 lbs 20 x 2   Patient tolerated session well; denies any painbut does report some fatigue at end of session  PT Education - 11/28/17 1530    Education provided  Yes    Education Details  HEP    Person(s) Educated  Patient    Methods  Explanation    Comprehension  Verbalized understanding       PT Short Term Goals - 09/23/17 1322      PT SHORT TERM GOAL #1   Title  Patient will be independent in home exercise program to improve strength/mobility for better functional independence with ADLs.    Baseline  Patient is doing well with her exercises    Time  4    Period  Weeks    Status  Achieved      PT SHORT TERM GOAL #2   Title  Patient (> 35 years old) will complete five times sit to stand test in < 15 seconds indicating an increased LE strength and improved balance.    Baseline  22.19 sec 07/31/17; 08/21/17: 19.5s    Time  4    Period  Weeks    Status  On-going        PT Long Term Goals - 11/07/17 1525      PT LONG TERM GOAL #1   Title  Patient will increase six minute walk test distance to >1000 for progression to community  ambulator and improve gait ability    Baseline  07/31/17 1085 feet; 08/21/17: 1005',     Time  8    Period  Weeks    Status  Achieved      PT LONG TERM GOAL #2   Title  Patient will increase BLE gross strength to 4+/5 as to improve functional strength for independent gait, increased standing tolerance and increased ADL ability.    Baseline  3+/5 BLE hips, 08/21/17: hip flex/ext/abd/add 4/5, hip IR/ER 4+/5, Knee ext: 5/5, Knee flex: 4/4, 09/23/17 knee flex 4+/5, ext 5/5    Time  8    Period  Weeks    Status  Achieved      PT LONG TERM GOAL #3   Title  Patient will ascend/descend 4 stairs without rail assist independently without loss of balance to improve ability to get in/out of home.     Baseline  Definite need of railings and slow and guarded; 08/21/17: Need for rail, decreased speed, guarded, 09/23/17 Need for rail, decreased speed, guarded    Time  8    Period  Weeks    Status  Partially Met    Target Date  12/11/17      PT LONG TERM GOAL #4   Title  Patient will reduce timed up and go to <11 seconds to reduce fall risk and demonstrate improved transfer/gait ability.    Baseline  19.87 sec 07/31/17; 08/21/17: 11.8 sec, 09/23/17=12.79 sec, 12/20 sec 10/22/17: 11/07/17=    Time  8    Period  Weeks    Status  Partially Met    Target Date  12/11/17      PT LONG TERM GOAL #5   Title  patient will be able to ascend and descend the steps and carry items without railing.     Time  8    Period  Weeks    Status  Partially Met    Target Date  12/11/17            Plan - 11/28/17 1533    Clinical Impression Statement  Patient requires verbal and tactile cueing during tandem balance with eyes closed and required CGA during all dynamic  standing balance activities. Patient reported fatigued more frequently today due to constant shoulder pain. Patient has also reported improved balance wiht improvement. Patient will continue to benefit from skilled therapy in order to decrease risk for falls and  improve    Rehab Potential  Good    PT Frequency  2x / week    PT Duration  8 weeks    PT Treatment/Interventions  Gait training;Therapeutic exercise;Therapeutic activities;Stair training;Balance training;Neuromuscular re-education;Patient/family education;Manual techniques;Aquatic Therapy;Moist Heat;Electrical Stimulation    PT Next Visit Plan  balance and therapeutic exercise for hip weakness    Consulted and Agree with Plan of Care  Patient;Family member/caregiver       Patient will benefit from skilled therapeutic intervention in order to improve the following deficits and impairments:  Abnormal gait, Decreased balance, Decreased endurance, Decreased mobility, Difficulty walking, Decreased knowledge of precautions, Decreased activity tolerance, Decreased coordination, Decreased safety awareness, Decreased strength, Impaired flexibility, Postural dysfunction  Visit Diagnosis: Muscle weakness (generalized)  Other lack of coordination  Unsteadiness on feet  Difficulty in walking, not elsewhere classified  Low vision, both eyes     Problem List There are no active problems to display for this patient.   [address], [place] DPT 11/28/2017, 3:37 PM  [place] MAIN [facility] SERVICES [address] [place], [place], [address] Phone: [phone]   Fax:  [phone]  Name: Haley Lucas MRN: [id number] Date of Birth: [birth year]

## 2017-12-03 ENCOUNTER — Ambulatory Visit: Payer: Medicare Other | Admitting: Physical Therapy

## 2017-12-03 ENCOUNTER — Encounter: Payer: Self-pay | Admitting: Physical Therapy

## 2017-12-03 ENCOUNTER — Encounter: Payer: Self-pay | Admitting: Occupational Therapy

## 2017-12-03 ENCOUNTER — Ambulatory Visit: Payer: Medicare Other | Admitting: Occupational Therapy

## 2017-12-03 ENCOUNTER — Ambulatory Visit: Payer: Medicare Other

## 2017-12-03 DIAGNOSIS — R2681 Unsteadiness on feet: Secondary | ICD-10-CM

## 2017-12-03 DIAGNOSIS — R278 Other lack of coordination: Secondary | ICD-10-CM

## 2017-12-03 DIAGNOSIS — H543 Unqualified visual loss, both eyes: Secondary | ICD-10-CM

## 2017-12-03 DIAGNOSIS — M6281 Muscle weakness (generalized): Secondary | ICD-10-CM | POA: Diagnosis not present

## 2017-12-03 DIAGNOSIS — R262 Difficulty in walking, not elsewhere classified: Secondary | ICD-10-CM

## 2017-12-03 NOTE — Therapy (Signed)
[place] MAIN [facility] SERVICES [address] [place], [place], [address] Phone: [phone]   Fax:  [phone]  Occupational Therapy Treatment  Patient Details  Name: Haley Lucas MRN: [id number] Date of Birth: [birth year] Referring Provider: [name]    Encounter Date: 12/03/2017  OT End of Session - 12/03/17 1558    Visit Number  31    Number of Visits  9    Date for OT Re-Evaluation  12/10/17    Authorization Type  Visit 3 of 10 for progress report period starting 11/19/2017    OT Start Time  1445    OT Stop Time  1530    OT Time Calculation (min)  45 min    Activity Tolerance  Patient tolerated treatment well    Behavior During Therapy  [facility] for tasks assessed/performed       Past Medical History:  Diagnosis Date  . High cholesterol   . Hypertension   . Thyroid disease     History reviewed. No pertinent surgical history.  There were no vitals filed for this visit.  Subjective Assessment - 12/03/17 1556    Subjective   Pt. reports that she is planning to move to [facility] when her daughter buys a home.    Patient is accompained by:  Family member    Pertinent History  Pt is a 67 y.o. 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 Pt. received inpatient rehabilitation services followed by home health services. Pt. had surgery to repair a right drooping eyelid on February 19th., 2019. Pt. is ready for outpatient OT services.    Patient Stated Goals  Be independent    Currently in Pain?  Yes    Pain Score  7     Pain Location  Shoulder    Pain Orientation  Right;Left    Pain Descriptors / Indicators  Aching    Pain Type  Chronic pain    Pain Onset  More than a month ago      OT TREATMENT    Therapeutic Exercise:  Pt.worked on AROM in all joint ranges of the LUEin sitting, for scapular elevation, depression, abduction, rotation, retraction, internal, and external rotationsupine and sitting.AROM  with the left shoulder in supine for shoulder flexion, abduction, and protraction in supine. Pt.tolerated2# dowelexinsupinefor shoulder flexion, chest press, and circular motionin supinefollowed byreps in sitting. 2# dumbbell ex. for elbow flexion and extension, forearm supination/pronation, and wrist flexion/extension. Pt. requires rest breaks and verbal cues for proper technique. Pt. left shoulder ROM was 110in sitting prior to there. ex, improved to 122fllowing there. ex.Pt. worked on the [employer] 8 min. with constant monitoring of the BUEs. Pt. worked on functional reaching with the LUE placing hanging hangers in the closet.                         OT Education - 12/03/17 1556    Education provided  Yes    Education Details  ROM, and strengthening exercises    Person(s) Educated  Patient    Methods  Explanation;Demonstration;Tactile cues;Verbal cues    Comprehension  Verbalized understanding;Returned demonstration          OT Long Term Goals - 11/14/17 1630      OT LONG TERM GOAL #1   Title  Pt. will increase UE strength by 2 mm grades to assist with ADLs, and IADL    Baseline  11/14/2017:  Pt. is progressing with ROM,  and light strengthening.    Time  12    Period  Weeks    Status  On-going    Target Date  12/10/17      OT LONG TERM GOAL #2   Title  Pt. will improve right hand [facility] skills by 3 sec. to be able to manipulate ADL items.    Baseline  Pt. is now able to manipulate ADL, and IADL items independently    Time  12    Period  Weeks    Status  Partially Met      OT LONG TERM GOAL #3   Title  Pt. will demonstrate visual compensatory strategies 100% of the time during ADLs, and IADLs.    Baseline  11/14/2017: Pt. plans to return to the her eye physician next week, and have something removed from her eye following the surgery. Pt. continues to prsnet with low vision.    Time  12    Period  Weeks    Status  On-going    Target Date   12/10/17      OT LONG TERM GOAL #5   Title  Pt. will complete IADL, home management tasks with Supervision.     Baseline  11/14/2017: MinA    Time  12    Period  Weeks    Status  On-going    Target Date  12/10/17      OT LONG TERM GOAL #6   Title  Pt. will increase left shoulder ROM to be able to independently retrieve items from the cabinetry/closets.    Baseline  10/08/2017: Pt. sitting AROM left shoulder flexion: 113 in sitting before session, 125 at the end of the session.    Time  12    Period  Weeks    Status  On-going    Target Date  12/10/17            Plan - 12/03/17 1558    Clinical Impression Statement  Pt. had an appointment last week with her physician. Pt. plans to return in two weeks for an injection. Pt. continues to work on improving LUE shoulder ROM, and UE strengthening. Pt. is responding well to the session with increased ROM from the beginning to the end of the session, increasing AROM from 110 to 124. Pt. continues to work on improving UE functioning for ADLs, and IADLs.      Occupational Profile and client history currently impacting functional performance  Pt. is married, has grown children, and was running a small business cooking.    Occupational performance deficits (Please refer to evaluation for details):  ADL's;IADL's    Rehab Potential  Good    Current Impairments/barriers affecting progress:  Positive indicators: age, family support, motivation, Negative indicators: multiple comorbidities.    OT Frequency  2x / week    OT Duration  12 weeks    OT Treatment/Interventions  Self-care/ADL training;Neuromuscular education;Therapeutic activities;Cognitive remediation/compensation;Passive range of motion;DME and/or AE instruction;Patient/family education;Energy conservation;Therapeutic exercise;Manual Therapy    Clinical Decision Making  Several treatment options, min-mod task modification necessary    Consulted and Agree with Plan of Care  Patient        Patient will benefit from skilled therapeutic intervention in order to improve the following deficits and impairments:  Pain, Impaired UE functional use, Decreased knowledge of precautions, Decreased cognition, Impaired tone, Decreased strength, Decreased endurance, Decreased activity tolerance, Decreased knowledge of use of DME, Decreased balance  Visit Diagnosis: Muscle  weakness (generalized)    Problem List There are no active problems to display for this patient.   Harrel Carina, MS, OTR/L 12/03/2017, 4:32 PM  [place] MAIN [facility] SERVICES [address] [place], [place], [address] Phone: [phone]   Fax:  [phone]  Name: Haley Lucas MRN: [id number] Date of Birth: 1951/03/11

## 2017-12-03 NOTE — Therapy (Signed)
[place] MAIN [facility] SERVICES [address] [place], [place], [address] Phone: [phone]   Fax:  [phone]  Physical Therapy Treatment  Patient Details  Name: Haley Lucas MRN: [id number] Date of Birth: [birth year] Referring Provider: [name]    Encounter Date: 12/03/2017  PT End of Session - 12/03/17 1557    Visit Number  37    Number of Visits  49    Date for PT Re-Evaluation  12/11/17    PT Start Time  0345    PT Stop Time  0430    PT Time Calculation (min)  45 min    Equipment Utilized During Treatment  Gait belt    Activity Tolerance  Patient tolerated treatment well    Behavior During Therapy  [facility] for tasks assessed/performed       Past Medical History:  Diagnosis Date  . High cholesterol   . Hypertension   . Thyroid disease     History reviewed. No pertinent surgical history.  There were no vitals filed for this visit.  Subjective Assessment - 12/03/17 1555    Subjective  Patient reports that she is not able to lie on her left side. She continues to have shoulder pain. She reports that her balance is a little bit better     Pertinent History  She had a hughes flap to her eye right eye feb 18th at [facility]. She was in the hospital for a month from the first surgey and was discharged Dec 11th. She had PT and OT for  2 weeks and then HHPT PT, OT and ST.  Tha ended christmas. She was not using a Assistive devie prior to surgery. She has weakness in her arms and she is not able to walk her normal distances.     Limitations  Standing    How long can you stand comfortably?  10 mins    Patient Stated Goals  to be able to walk and stand for longer periods of time, and improve balance.     Currently in Pain?  Yes    Pain Score  7     Pain Location  Shoulder    Pain Orientation  Right;Left    Pain Descriptors / Indicators  Aching    Pain Onset  More than a month ago    Pain Onset  In the past 7 days       Neuromuscular  training:  Bosu ball lunge from foam x 10 reps  Tandem standing on 1/2 foam, (flat side down) and lateral tapping left and right to stepping stone x 10 with UE support intermiteent 50%  Toe tapping 6 inch stool without UE assistfrom foam x 20 , cues for good postureues, 20%  Loss of balance and use of UE  Tandem gait in // bars x 4 laps;CGA use due to increase postural sway but no LOB 10% UE support due to loss of balance  Side stepping on blue foam balance beam x 5 lengths of the parallel bars;Patient needs occasional verbal cueingand CGAto maintain center of gravity during all dynamic standing balance activities.Patient required UE support 30% for side stepping.  Standing on 1/2 foam with flat side down and head turns, trunk rotation x 20 left and right, 5 % UE support for loss of balance  Resisted walking: 22.5# forward/backward, side/side x3laps each direction with CGA for safety and cues to improve step length for better balance challenge; Required cues to slow down  eccentric return for better balance control;  Leg press with 90 lbs x 20 x 2; heel raises with 60 lbs 20 x 2   Patient tolerated session well; denies any painbut does report some fatigue at end of session                        PT Education - 12/03/17 1557    Education provided  Yes    Education Details  HEP    Person(s) Educated  Patient    Methods  Explanation    Comprehension  Verbalized understanding       PT Short Term Goals - 09/23/17 1322      PT SHORT TERM GOAL #1   Title  Patient will be independent in home exercise program to improve strength/mobility for better functional independence with ADLs.    Baseline  Patient is doing well with her exercises    Time  4    Period  Weeks    Status  Achieved      PT SHORT TERM GOAL #2   Title  Patient (> 31 years old) will complete five times sit to stand test in < 15 seconds indicating an increased LE strength and  improved balance.    Baseline  22.19 sec 07/31/17; 08/21/17: 19.5s    Time  4    Period  Weeks    Status  On-going        PT Long Term Goals - 11/07/17 1525      PT LONG TERM GOAL #1   Title  Patient will increase six minute walk test distance to >1000 for progression to community ambulator and improve gait ability    Baseline  07/31/17 1085 feet; 08/21/17: 1005',     Time  8    Period  Weeks    Status  Achieved      PT LONG TERM GOAL #2   Title  Patient will increase BLE gross strength to 4+/5 as to improve functional strength for independent gait, increased standing tolerance and increased ADL ability.    Baseline  3+/5 BLE hips, 08/21/17: hip flex/ext/abd/add 4/5, hip IR/ER 4+/5, Knee ext: 5/5, Knee flex: 4/4, 09/23/17 knee flex 4+/5, ext 5/5    Time  8    Period  Weeks    Status  Achieved      PT LONG TERM GOAL #3   Title  Patient will ascend/descend 4 stairs without rail assist independently without loss of balance to improve ability to get in/out of home.     Baseline  Definite need of railings and slow and guarded; 08/21/17: Need for rail, decreased speed, guarded, 09/23/17 Need for rail, decreased speed, guarded    Time  8    Period  Weeks    Status  Partially Met    Target Date  12/11/17      PT LONG TERM GOAL #4   Title  Patient will reduce timed up and go to <11 seconds to reduce fall risk and demonstrate improved transfer/gait ability.    Baseline  19.87 sec 07/31/17; 08/21/17: 11.8 sec, 09/23/17=12.79 sec, 12/20 sec 10/22/17: 11/07/17=    Time  8    Period  Weeks    Status  Partially Met    Target Date  12/11/17      PT LONG TERM GOAL #5   Title  patient will be able to ascend and descend the steps and carry items without railing.  Time  8    Period  Weeks    Status  Partially Met    Target Date  12/11/17            Plan - 12/03/17 1558    Clinical Impression Statement  Patient instructed in intermediate balance/strengthening exercise. Patient fatigues quickly  requiring short rest breaks in between intermediate exercise. Patient instructed in advanced strengthening with increased repetition/resistance. Patient requires CGA for intermediate balance exercise especially with less rail assist. Patient would benefit from additional skilled PT intervention to improve balance/gait safety and reduce fall risk.    Rehab Potential  Good    PT Frequency  2x / week    PT Duration  8 weeks    PT Treatment/Interventions  Gait training;Therapeutic exercise;Therapeutic activities;Stair training;Balance training;Neuromuscular re-education;Patient/family education;Manual techniques;Aquatic Therapy;Moist Heat;Electrical Stimulation    PT Next Visit Plan  balance and therapeutic exercise for hip weakness    Consulted and Agree with Plan of Care  Patient;Family member/caregiver       Patient will benefit from skilled therapeutic intervention in order to improve the following deficits and impairments:  Abnormal gait, Decreased balance, Decreased endurance, Decreased mobility, Difficulty walking, Decreased knowledge of precautions, Decreased activity tolerance, Decreased coordination, Decreased safety awareness, Decreased strength, Impaired flexibility, Postural dysfunction  Visit Diagnosis: Muscle weakness (generalized)  Other lack of coordination  Unsteadiness on feet  Difficulty in walking, not elsewhere classified  Low vision, both eyes     Problem List There are no active problems to display for this patient.   [address] [place] DPT 12/03/2017, 4:06 PM  [place] MAIN [facility] SERVICES [address] [place], [place], [address] Phone: [phone]   Fax:  [phone]  Name: Haley Lucas MRN: [id number] Date of Birth: 01-11-1951

## 2017-12-05 ENCOUNTER — Ambulatory Visit: Payer: Medicare Other

## 2017-12-05 ENCOUNTER — Ambulatory Visit: Payer: Medicare Other | Admitting: Occupational Therapy

## 2017-12-05 ENCOUNTER — Encounter: Payer: Self-pay | Admitting: Occupational Therapy

## 2017-12-05 ENCOUNTER — Encounter: Payer: Self-pay | Admitting: Physical Therapy

## 2017-12-05 DIAGNOSIS — M6281 Muscle weakness (generalized): Secondary | ICD-10-CM | POA: Diagnosis not present

## 2017-12-05 DIAGNOSIS — R278 Other lack of coordination: Secondary | ICD-10-CM

## 2017-12-05 DIAGNOSIS — R2681 Unsteadiness on feet: Secondary | ICD-10-CM

## 2017-12-05 DIAGNOSIS — R262 Difficulty in walking, not elsewhere classified: Secondary | ICD-10-CM

## 2017-12-05 NOTE — Therapy (Signed)
[place] MAIN [facility] SERVICES [address] [place], [place], [address] Phone: [phone]   Fax:  [phone]  Occupational Therapy Treatment  Patient Details  Name: Haley Lucas MRN: [id number] Date of Birth: [birth year] Referring Provider: Janalyn Lucas    Encounter Date: 12/05/2017  OT End of Session - 12/05/17 1447    Visit Number  32    Number of Visits  44    Date for OT Re-Evaluation  12/10/17    Authorization Type  Visit 4 of 10 for progress report period starting 11/19/2017    OT Start Time  1433    OT Stop Time  1515    OT Time Calculation (min)  42 min    Activity Tolerance  Patient tolerated treatment well    Behavior During Therapy  [facility] for tasks assessed/performed       Past Medical History:  Diagnosis Date  . High cholesterol   . Hypertension   . Thyroid disease     History reviewed. No pertinent surgical history.  There were no vitals filed for this visit.  Subjective Assessment - 12/05/17 1445    Subjective   Pt. reports that she is planning to move to [facility] when her daughter buys a home.    Pertinent History  Pt is a 67 y.o. 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 Pt. received inpatient rehabilitation services followed by home health services. Pt. had surgery to repair a right drooping eyelid on February 19th., 2019. Pt. is ready for outpatient OT services.    Patient Stated Goals  Be independent    Currently in Pain?  Yes    Pain Score  7     Pain Orientation  Right;Left      OT TREATMENT    Therapeutic Exercise:  Pt.worked on AROM in all joint ranges of the LUEin sitting, for scapular elevation, depression, abduction, rotation, retraction, internal, and external rotationsupine and sitting.AROM with the left shoulder in supine for shoulder flexion, abduction, and protraction in supine. Pt.tolerated2# dowelexinsupinefor shoulder flexion, chest press, and circular motionin  supinefollowed byreps in sitting. 2# dumbbell ex. for elbow flexion and extension, forearm supination/pronation, and wrist flexion/extension. Pt. requires rest breaks and verbal cues for proper technique. Pt.left shoulder ROM was 110in sitting prior to there.ex, improved to 110fllowing there.ex.Pt.worked on the Sci[employer] for 8 min.with constant monitoring of the BUEs.Pt. worked on functional reaching with the LUE placing hanging hangers in the closet. Pt. About engaging her LUE during ADL tasks at home.                          OT Education - 12/05/17 1446    Education provided  Yes    Education Details  ROM, and strengthening exercises    Person(s) Educated  Patient    Methods  Explanation;Demonstration;Tactile cues;Verbal cues    Comprehension  Verbalized understanding;Returned demonstration          OT Long Term Goals - 11/14/17 1630      OT LONG TERM GOAL #1   Title  Pt. will increase UE strength by 2 mm grades to assist with ADLs, and IADL    Baseline  11/14/2017: Pt. is progressing with ROM,  and light strengthening.    Time  12    Period  Weeks    Status  On-going    Target Date  12/10/17      OT  LONG TERM GOAL #2   Title  Pt. will improve right hand [facility] skills by 3 sec. to be able to manipulate ADL items.    Baseline  Pt. is now able to manipulate ADL, and IADL items independently    Time  12    Period  Weeks    Status  Partially Met      OT LONG TERM GOAL #3   Title  Pt. will demonstrate visual compensatory strategies 100% of the time during ADLs, and IADLs.    Baseline  11/14/2017: Pt. plans to return to the her eye physician next week, and have something removed from her eye following the surgery. Pt. continues to prsnet with low vision.    Time  12    Period  Weeks    Status  On-going    Target Date  12/10/17      OT LONG TERM GOAL #5   Title  Pt. will complete IADL, home management tasks with Supervision.     Baseline  11/14/2017:  MinA    Time  12    Period  Weeks    Status  On-going    Target Date  12/10/17      OT LONG TERM GOAL #6   Title  Pt. will increase left shoulder ROM to be able to independently retrieve items from the cabinetry/closets.    Baseline  10/08/2017: Pt. sitting AROM left shoulder flexion: 113 in sitting before session, 125 at the end of the session.    Time  12    Period  Weeks    Status  On-going    Target Date  12/10/17            Plan - 12/05/17 1447    Clinical Impression Statement Pt. plans to have an injection next week in her left shoulder. Pt. continues to work on improving LUE ROM, and functional reaching during ADLs, and IADLs.     Occupational Profile and client history currently impacting functional performance  Pt. is married, has grown children, and was running a small business cooking.    Occupational performance deficits (Please refer to evaluation for details):  ADL's;IADL's    Rehab Potential  Good    Current Impairments/barriers affecting progress:  Positive indicators: age, family support, motivation, Negative indicators: multiple comorbidities.    OT Frequency  2x / week    OT Duration  12 weeks    OT Treatment/Interventions  Self-care/ADL training;Neuromuscular education;Therapeutic activities;Cognitive remediation/compensation;Passive range of motion;DME and/or AE instruction;Patient/family education;Energy conservation;Therapeutic exercise;Manual Therapy    Clinical Decision Making  Several treatment options, min-mod task modification necessary    Consulted and Agree with Plan of Care  Patient       Patient will benefit from skilled therapeutic intervention in order to improve the following deficits and impairments:  Pain, Impaired UE functional use, Decreased knowledge of precautions, Decreased cognition, Impaired tone, Decreased strength, Decreased endurance, Decreased activity tolerance, Decreased knowledge of use of DME, Decreased balance  Visit  Diagnosis: Muscle weakness (generalized)  Other lack of coordination    Problem List There are no active problems to display for this patient.   Haley Carina, MS, OTR/L 12/05/2017, 2:52 PM  [place] MAIN [facility] SERVICES [address] [place], [place], [address] Phone: [phone]   Fax:  [phone]  Name: DEVANI ODONNEL MRN: [id number] Date of Birth: Jan 04, 1951

## 2017-12-05 NOTE — Therapy (Signed)
[place] MAIN [facility] SERVICES [address] [place], [place], [address] Phone: [phone]   Fax:  [phone]  Physical Therapy Treatment  Patient Details  Name: Haley Lucas MRN: [id number] Date of Birth: 1951-04-14 Referring Provider: [name]    Encounter Date: 12/05/2017  PT End of Session - 12/05/17 1521    Visit Number  38    Number of Visits  49    Date for PT Re-Evaluation  12/11/17    PT Start Time  3500    PT Stop Time  1600    PT Time Calculation (min)  45 min    Equipment Utilized During Treatment  Gait belt    Activity Tolerance  Patient tolerated treatment well    Behavior During Therapy  [facility] for tasks assessed/performed       Past Medical History:  Diagnosis Date  . High cholesterol   . Hypertension   . Thyroid disease     History reviewed. No pertinent surgical history.  There were no vitals filed for this visit.  Subjective Assessment - 12/05/17 1518    Subjective  Patient reports no pain today. Reports balance is getting better. No falls since seen last.     Pertinent History  She had a hughes flap to her eye right eye feb 18th at [facility]. She was in the hospital for a month from the first surgey and was discharged Dec 11th. She had PT and OT for  2 weeks and then HHPT PT, OT and ST.  Tha ended christmas. She was not using a Assistive devie prior to surgery. She has weakness in her arms and she is not able to walk her normal distances.     Limitations  Standing    How long can you stand comfortably?  10 mins    Patient Stated Goals  to be able to walk and stand for longer periods of time, and improve balance.     Currently in Pain?  Yes    Pain Score  7     Pain Location  Shoulder    Pain Orientation  Right;Left    Pain Descriptors / Indicators  Aching    Pain Type  Chronic pain    Pain Onset  More than a month ago    Pain Frequency  Constant        Neuromuscular training:   Tandem gait on blue foam  balance beam 4x length of bars; occasional verbal cueing and CGA for maintaining center of mass. Occasional finger tip support to maintain COM.       Standing on 1/2 foam with flat side down and head turns x 20 left and right   Resisted walking: 22.5# forward/backward, side/side over step   x3 laps each direction with CGA for safety and cues to improve step length for better balance challenge; Required cues to slow down eccentric return for better balance control;   Bilateral:  Leg press with 90 lbs x 20 x 2; Single leg: leg press : 60 lb 15x 2 sets     Figure 4: tap 1,2,3,4 based on what PT verbalized  Ambulating in hall with upright ball tosses  86 ft CGA  Ambulating in hallway ball bounces 86 ft with CGA 3 LOB  Ambulate in hallway with horizontal head turns for card noting 86 ft x 2 trials with CGA  Patient tolerated session well; denies any pain but does report some fatigue at end of session  Patient's RLE fatigues quicker than LLE.                     PT Education - 12/05/17 1521    Education provided  Yes    Education Details  exercise technique, stability     Person(s) Educated  Patient    Methods  Explanation;Demonstration;Verbal cues    Comprehension  Verbalized understanding;Returned demonstration       PT Short Term Goals - 09/23/17 1322      PT SHORT TERM GOAL #1   Title  Patient will be independent in home exercise program to improve strength/mobility for better functional independence with ADLs.    Baseline  Patient is doing well with her exercises    Time  4    Period  Weeks    Status  Achieved      PT SHORT TERM GOAL #2   Title  Patient (> 24 years old) will complete five times sit to stand test in < 15 seconds indicating an increased LE strength and improved balance.    Baseline  22.19 sec 07/31/17; 08/21/17: 19.5s    Time  4    Period  Weeks    Status  On-going        PT Long Term Goals - 11/07/17 1525      PT LONG TERM GOAL  #1   Title  Patient will increase six minute walk test distance to >1000 for progression to community ambulator and improve gait ability    Baseline  07/31/17 1085 feet; 08/21/17: 1005',     Time  8    Period  Weeks    Status  Achieved      PT LONG TERM GOAL #2   Title  Patient will increase BLE gross strength to 4+/5 as to improve functional strength for independent gait, increased standing tolerance and increased ADL ability.    Baseline  3+/5 BLE hips, 08/21/17: hip flex/ext/abd/add 4/5, hip IR/ER 4+/5, Knee ext: 5/5, Knee flex: 4/4, 09/23/17 knee flex 4+/5, ext 5/5    Time  8    Period  Weeks    Status  Achieved      PT LONG TERM GOAL #3   Title  Patient will ascend/descend 4 stairs without rail assist independently without loss of balance to improve ability to get in/out of home.     Baseline  Definite need of railings and slow and guarded; 08/21/17: Need for rail, decreased speed, guarded, 09/23/17 Need for rail, decreased speed, guarded    Time  8    Period  Weeks    Status  Partially Met    Target Date  12/11/17      PT LONG TERM GOAL #4   Title  Patient will reduce timed up and go to <11 seconds to reduce fall risk and demonstrate improved transfer/gait ability.    Baseline  19.87 sec 07/31/17; 08/21/17: 11.8 sec, 09/23/17=12.79 sec, 12/20 sec 10/22/17: 11/07/17=    Time  8    Period  Weeks    Status  Partially Met    Target Date  12/11/17      PT LONG TERM GOAL #5   Title  patient will be able to ascend and descend the steps and carry items without railing.     Time  8    Period  Weeks    Status  Partially Met    Target Date  12/11/17  Plan - 12/05/17 1529    Clinical Impression Statement  Patient requires occasional tactile cueing for positioning of feet for leg press however demonstrates good muscle control with proper recruitment and limited compensatory patterning. Patient instructed on intermediate strengthening and balance interventions with occasional short  breaks for fatigue. Patient would benefit from additional skilled PT intervention to improve balance/gait safety and reduce fall risk.    Rehab Potential  Good    PT Frequency  2x / week    PT Duration  8 weeks    PT Treatment/Interventions  Gait training;Therapeutic exercise;Therapeutic activities;Stair training;Balance training;Neuromuscular re-education;Patient/family education;Manual techniques;Aquatic Therapy;Moist Heat;Electrical Stimulation    PT Next Visit Plan  balance and therapeutic exercise for hip weakness    Consulted and Agree with Plan of Care  Patient;Family member/caregiver       Patient will benefit from skilled therapeutic intervention in order to improve the following deficits and impairments:  Abnormal gait, Decreased balance, Decreased endurance, Decreased mobility, Difficulty walking, Decreased knowledge of precautions, Decreased activity tolerance, Decreased coordination, Decreased safety awareness, Decreased strength, Impaired flexibility, Postural dysfunction  Visit Diagnosis: Muscle weakness (generalized)  Other lack of coordination  Unsteadiness on feet  Difficulty in walking, not elsewhere classified     Problem List There are no active problems to display for this patient.  Janna Arch, PT, DPT   12/05/2017, 4:07 PM  [place] MAIN [facility] SERVICES [address] [place], [place], [address] Phone: [phone]   Fax:  [phone]  Name: Haley Lucas MRN: [id number] Date of Birth: 1950-11-05

## 2017-12-10 ENCOUNTER — Ambulatory Visit: Payer: Medicare Other | Admitting: Occupational Therapy

## 2017-12-10 ENCOUNTER — Ambulatory Visit: Payer: Medicare Other | Admitting: Physical Therapy

## 2017-12-12 ENCOUNTER — Encounter: Payer: Self-pay | Admitting: Physical Therapy

## 2017-12-12 ENCOUNTER — Ambulatory Visit: Payer: Medicare Other | Admitting: Physical Therapy

## 2017-12-12 ENCOUNTER — Ambulatory Visit: Payer: Medicare Other | Admitting: Occupational Therapy

## 2017-12-12 DIAGNOSIS — M6281 Muscle weakness (generalized): Secondary | ICD-10-CM

## 2017-12-12 DIAGNOSIS — R278 Other lack of coordination: Secondary | ICD-10-CM

## 2017-12-12 DIAGNOSIS — H543 Unqualified visual loss, both eyes: Secondary | ICD-10-CM

## 2017-12-12 DIAGNOSIS — R262 Difficulty in walking, not elsewhere classified: Secondary | ICD-10-CM

## 2017-12-12 DIAGNOSIS — R2681 Unsteadiness on feet: Secondary | ICD-10-CM

## 2017-12-12 NOTE — Therapy (Signed)
[place] MAIN [facility] SERVICES [address] [place], [place], [address] Phone: [phone]   Fax:  [phone]  Physical Therapy Treatment  Patient Details  Name: Haley Lucas MRN: [id number] Date of Birth: December 20, 1950 Referring Provider: [name]    Encounter Date: 12/12/2017  PT End of Session - 12/12/17 1528    Visit Number  39    Number of Visits  49    Date for PT Re-Evaluation  02/05/18    PT Start Time  0320    PT Stop Time  0400    PT Time Calculation (min)  40 min    Equipment Utilized During Treatment  Gait belt    Activity Tolerance  Patient tolerated treatment well    Behavior During Therapy  [facility] for tasks assessed/performed       Past Medical History:  Diagnosis Date  . High cholesterol   . Hypertension   . Thyroid disease     History reviewed. No pertinent surgical history.  There were no vitals filed for this visit.  Subjective Assessment - 12/12/17 1526    Subjective  Patient reports no pain today. Reports balance is getting better. No falls since seen last.     Pertinent History  She had a hughes flap to her eye right eye feb 18th at [facility]. She was in the hospital for a month from the first surgey and was discharged Dec 11th. She had PT and OT for  2 weeks and then HHPT PT, OT and ST.  Tha ended christmas. She was not using a Assistive devie prior to surgery. She has weakness in her arms and she is not able to walk her normal distances.     Limitations  Standing    How long can you stand comfortably?  10 mins    Patient Stated Goals  to be able to walk and stand for longer periods of time, and improve balance.     Currently in Pain?  Yes    Pain Score  7     Pain Location  Shoulder    Pain Orientation  Right;Left    Pain Descriptors / Indicators  Aching    Pain Onset  More than a month ago    Pain Frequency  Constant    Aggravating Factors   sleeping     Pain Relieving Factors  nothing    Effect of Pain  on Daily Activities  she has difficulty raising her UE's    Pain Onset  In the past 7 days       Patient performs outcome measures including: 6 MW test, TUG and her goals are reviewed including strength testing and ascending/descending steps.   She improved with her 6 MW test, reached her goal for TUG and a new goal for DGI was made.   Neuromuscular training:  Balloon tapping on foam to mirror and in parallel bars tapping to a second person with CGA  Fast ambulation with stop and turn , gait speed changes, turning head left and right, side stepping in hall way , and ambulation with head turns.                      PT Education - 12/12/17 1527    Education provided  Yes    Education Details  HEP, saftey with mobitiy    Person(s) Educated  Patient    Methods  Explanation    Comprehension  Verbalized understanding;Returned  demonstration       PT Short Term Goals - 09/23/17 1322      PT SHORT TERM GOAL #1   Title  Patient will be independent in home exercise program to improve strength/mobility for better functional independence with ADLs.    Baseline  Patient is doing well with her exercises    Time  4    Period  Weeks    Status  Achieved      PT SHORT TERM GOAL #2   Title  Patient (> 28 years old) will complete five times sit to stand test in < 15 seconds indicating an increased LE strength and improved balance.    Baseline  22.19 sec 07/31/17; 08/21/17: 19.5s    Time  4    Period  Weeks    Status  On-going        PT Long Term Goals - 12/12/17 1530      PT LONG TERM GOAL #1   Title  Patient will increase six minute walk test distance to >1000 for progression to community ambulator and improve gait ability    Baseline  07/31/17 1085 feet; 08/21/17: 1005', 12/12/17 1175 feet    Time  8    Period  Weeks    Status  Achieved    Target Date  02/06/18      PT LONG TERM GOAL #2   Title  Patient will increase BLE gross strength to 4+/5 as to improve functional  strength for independent gait, increased standing tolerance and increased ADL ability.    Baseline  3+/5 BLE hips, 08/21/17: hip flex/ext/abd/add 4/5, hip IR/ER 4+/5, Knee ext: 5/5, Knee flex: 4/4, 09/23/17 knee flex 4+/5, ext 5/5    Time  8    Period  Weeks    Status  Achieved    Target Date  --      PT LONG TERM GOAL #3   Title  Patient will ascend/descend 4 stairs without rail assist independently without loss of balance to improve ability to get in/out of home.     Baseline  Definite need of railings and slow and guarded; 08/21/17: Need for rail, decreased speed, guarded, 09/23/17 Need for rail, decreased speed, guard:ed:: 12/12/17  Patient takes one step at a time and very guarded and slow uses hands on legs to stabilize.    Time  8    Period  Weeks    Status  Partially Met    Target Date  02/06/18      PT LONG TERM GOAL #4   Title  Patient will reduce timed up and go to <11 seconds to reduce fall risk and demonstrate improved transfer/gait ability.    Baseline  19.87 sec 07/31/17; 08/21/17: 11.8 sec, 09/23/17=12.79 sec, 12/20 sec 10/22/17: 12/08/17=10.50 sec    Time  8    Period  Weeks    Status  Achieved    Target Date  12/12/17      PT LONG TERM GOAL #5   Title  patient will be able to ascend and descend the steps and carry items without railing.     Baseline  unable     Time  8    Period  Weeks    Status  Partially Met    Target Date  02/06/18      Additional Long Term Goals   Additional Long Term Goals  --   Patient will improve her DGI by 3 points to improve functional ambulation.  PT LONG TERM GOAL #6   Title  Patient will improve her DGI by 3 points to improve functional ambulation.     Baseline  DGI 14/24    Time  8    Status  New    Target Date  02/06/18            Plan - 12/12/17 1643    Clinical Impression Statement  Patient's goals were re-assessed today with progression of scores on 6MW, TUG , and new goal for DGI was made. Patient fatigues with ambulation  and has decreased gait speed with head turns, stepping over obstacles and turns. Patient's condition has the potential to improve in response to therapy. Frequent cueing for swinging arms allowed patient improved stability with standing tasks. Maximum improvement is yet to be obtained. The anticipated improvement is attainable and reasonable in a generally predictable time. Pt will benefit from additional skilled PT intervention for improvements in balance, strength, and gait safety    Rehab Potential  Good    PT Frequency  2x / week    PT Duration  8 weeks    PT Treatment/Interventions  Gait training;Therapeutic exercise;Therapeutic activities;Stair training;Balance training;Neuromuscular re-education;Patient/family education;Manual techniques;Aquatic Therapy;Moist Heat;Electrical Stimulation    PT Next Visit Plan  balance and therapeutic exercise for hip weakness    Consulted and Agree with Plan of Care  Patient;Family member/caregiver       Patient will benefit from skilled therapeutic intervention in order to improve the following deficits and impairments:  Abnormal gait, Decreased balance, Decreased endurance, Decreased mobility, Difficulty walking, Decreased knowledge of precautions, Decreased activity tolerance, Decreased coordination, Decreased safety awareness, Decreased strength, Impaired flexibility, Postural dysfunction  Visit Diagnosis: Muscle weakness (generalized)  Other lack of coordination  Unsteadiness on feet  Difficulty in walking, not elsewhere classified  Low vision, both eyes     Problem List There are no active problems to display for this patient.   [address], [place] DPT 12/12/2017, 4:47 PM  [place] MAIN [facility] SERVICES [address] [place], [place], [address] Phone: [phone]   Fax:  [phone]  Name: Haley Lucas MRN: [id number] Date of Birth: 19-Mar-1951

## 2017-12-12 NOTE — Therapy (Signed)
[place] MAIN [facility] SERVICES [address] [place], [place], [address] Phone: [phone]   Fax:  [phone]  Occupational Therapy Treatment/Recertification/Discharge Summary  Patient Details  Name: Haley Lucas MRN: [id number] Date of Birth: 1951/03/13 Referring Provider: [name]    Encounter Date: 12/12/2017  OT End of Session - 12/12/17 1758    Visit Number  34    Number of Visits  18    Date for OT Re-Evaluation  12/12/17    Authorization Type  Visit 6 of 10 for progress report period starting 11/19/2017    OT Start Time  1433    OT Stop Time  1515    OT Time Calculation (min)  42 min    Activity Tolerance  Patient tolerated treatment well    Behavior During Therapy  [facility] for tasks assessed/performed       Past Medical History:  Diagnosis Date  . High cholesterol   . Hypertension   . Thyroid disease     No past surgical history on file.  There were no vitals filed for this visit.  Subjective Assessment - 12/12/17 1757    Subjective   Pt. reports no changes since the last session.    Patient is accompained by:  Family member    Pertinent History  Pt is a 67 y.o. 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 Pt. received inpatient rehabilitation services followed by home health services. Pt. had surgery to repair a right drooping eyelid on February 19th., 2019. Pt. is ready for outpatient OT services.    Patient Stated Goals  Be independent    Currently in Pain?  Yes    Pain Score  7     Pain Location  Shoulder    Pain Orientation  Left;Right    Pain Descriptors / Indicators  Aching    Pain Type  Chronic pain       OT TREATMENT    Measurements were obtained, and goals were reviewed with the pt.  There. Ex:  Pt. Performed LUE AAROM at the wall. Pt. performed 2# dowel ex. For UE strengthening secondary to weakness. Bilateral shoulder flexion, chest press, circular patterns, and elbow  flexion/extension were performed. 2# dumbbell ex. for elbow flexion and extension, forearm supination/pronation, wrist flexion/extension, and radial deviation. Pt. requires rest breaks and verbal cues for proper technique.      [facility] OT Assessment - 12/12/17 2127      Coordination   Right 9 Hole Peg Test  19    Left 9 Hole Peg Test  21      Hand Function   Right Hand Grip (lbs)  36#    Right Hand Lateral Pinch  12 lbs    Right Hand 3 Point Pinch  10 lbs    Left Hand Grip (lbs)  34#    Left Hand Lateral Pinch  12 lbs    Left 3 point pinch  9 lbs                       OT Education - 12/12/17 1758    Education provided  Yes    Education Details  ROM, and strengthening exercises    Person(s) Educated  Patient    Methods  Explanation;Demonstration;Tactile cues;Verbal cues    Comprehension  Verbalized understanding;Returned demonstration          OT Long Term Goals - 12/12/17 1452  OT LONG TERM GOAL #1   Title  Pt. will increase UE strength by 2 mm grades to assist with ADLs, and IADL    Baseline  left shoulder flexion 4-/5, bilateral elbow flexion, extension 5/5, wrist extension 5/5    Time  12    Period  Weeks    Status  Partially Met      OT LONG TERM GOAL #2   Title  Pt. will improve right hand [facility] skills by 3 sec. to be able to manipulate ADL items.    Baseline  Pt. is now able to manipulate ADL, and IADL items independently    Time  12    Period  Weeks    Status  Achieved      OT LONG TERM GOAL #3   Title  --    Time  --    Period  --    Status  --      OT LONG TERM GOAL #5   Title  Pt. will complete IADL, home management tasks with Supervision.     Baseline  MinA. Independent light meal prep    Time  12    Period  Weeks    Status  Partially Met      OT LONG TERM GOAL #6   Title  Pt. will increase left shoulder ROM to be able to independently retrieve items from the cabinetry/closets.    Baseline  Shoulder flexion 124 at the end of  the session, abduction: 89.  Pt. is now able to reach into cabinetry.    Time  12    Period  Weeks    Status  Partially Met            Plan - 12/12/17 1759    Clinical Impression Statement  Pt. plans to have a follow-up appointment regarding an injection in her bilateral shoulders tomorrow. Pt. has made progress overall since the initial eval with LUE ROM, grip strength, pinch strength, and fine motor coordination skills. Pt. continues to have discomfort, and decreased ROM with internal rotation when reaching to her back with the left UE. Goals were reviewed with the pt., and she is now ready to discharge from OT services.    Occupational Profile and client history currently impacting functional performance  Pt. is married, has grown children, and was running a small business cooking.    Occupational performance deficits (Please refer to evaluation for details):  ADL's;IADL's    Rehab Potential  Good    Current Impairments/barriers affecting progress:  Positive indicators: age, family support, motivation, Negative indicators: multiple comorbidities.    OT Frequency  2x / week    OT Duration  12 weeks    OT Treatment/Interventions  Self-care/ADL training;Neuromuscular education;Therapeutic activities;Cognitive remediation/compensation;Passive range of motion;DME and/or AE instruction;Patient/family education;Energy conservation;Therapeutic exercise;Manual Therapy    Clinical Decision Making  Several treatment options, min-mod task modification necessary    Consulted and Agree with Plan of Care  Patient       Patient will benefit from skilled therapeutic intervention in order to improve the following deficits and impairments:  Pain, Impaired UE functional use, Decreased knowledge of precautions, Decreased cognition, Impaired tone, Decreased strength, Decreased endurance, Decreased activity tolerance, Decreased knowledge of use of DME, Decreased balance  Visit Diagnosis: Muscle weakness  (generalized)  Other lack of coordination    Problem List There are no active problems to display for this patient.   Harrel Carina, MS, OTR/L 12/12/2017, 9:29 PM  [place]  [place] MAIN [facility] SERVICES [place], [place], [address] Phone: [phone]   Fax:  [phone]  Name: TAIWANA WILLISON MRN: [id number] Date of Birth: 1950-04-27

## 2017-12-12 NOTE — Addendum Note (Signed)
Addended by: Avon GullyJAGENTENFL, Dorothey Oetken M on: 12/12/2017 09:35 PM   Modules accepted: Orders

## 2017-12-17 ENCOUNTER — Ambulatory Visit: Payer: Medicare Other | Attending: Family Medicine | Admitting: Physical Therapy

## 2017-12-17 ENCOUNTER — Encounter: Payer: Medicare Other | Admitting: Occupational Therapy

## 2017-12-17 ENCOUNTER — Encounter: Payer: Self-pay | Admitting: Physical Therapy

## 2017-12-17 DIAGNOSIS — M6281 Muscle weakness (generalized): Secondary | ICD-10-CM | POA: Diagnosis not present

## 2017-12-17 DIAGNOSIS — R278 Other lack of coordination: Secondary | ICD-10-CM | POA: Insufficient documentation

## 2017-12-17 DIAGNOSIS — R262 Difficulty in walking, not elsewhere classified: Secondary | ICD-10-CM | POA: Diagnosis present

## 2017-12-17 DIAGNOSIS — H543 Unqualified visual loss, both eyes: Secondary | ICD-10-CM | POA: Insufficient documentation

## 2017-12-17 DIAGNOSIS — R2681 Unsteadiness on feet: Secondary | ICD-10-CM | POA: Diagnosis present

## 2017-12-17 NOTE — Therapy (Signed)
[place] MAIN [facility] SERVICES [address] [place], [place], [address] Phone: [phone]   Fax:  [phone]  Physical Therapy Treatment/ Physical Therapy Progress Note   Dates of reporting period 11/07/17   to  12/17/17 Patient Details  Name: Haley Lucas MRN: [id number] Date of Birth: [birth year] Referring Provider: [name]    Encounter Date: 12/17/2017  PT End of Session - 12/17/17 1459    Visit Number  40    Number of Visits  49    Date for PT Re-Evaluation  02/05/18    Authorization Type  10/10    PT Start Time  0245    PT Stop Time  0330    PT Time Calculation (min)  45 min    Equipment Utilized During Treatment  Gait belt    Activity Tolerance  Patient tolerated treatment well    Behavior During Therapy  [facility] for tasks assessed/performed       Past Medical History:  Diagnosis Date  . High cholesterol   . Hypertension   . Thyroid disease     History reviewed. No pertinent surgical history.  There were no vitals filed for this visit.  Subjective Assessment - 12/17/17 1457    Subjective  Patient reports no pain today. Reports balance is getting better. No falls since seen last. Her shoulder L is getting a little bit better.    Pertinent History  She had a hughes flap to her eye right eye feb 18th at [facility]. She was in the hospital for a month from the first surgey and was discharged Dec 11th. She had PT and OT for  2 weeks and then HHPT PT, OT and ST.  Tha ended christmas. She was not using a Assistive devie prior to surgery. She has weakness in her arms and she is not able to walk her normal distances.     Limitations  Standing    How long can you stand comfortably?  10 mins    Patient Stated Goals  to be able to walk and stand for longer periods of time, and improve balance.     Currently in Pain?  Yes    Pain Score  5     Pain Orientation  Left    Pain Descriptors / Indicators  Aching    Pain Type  Chronic pain     Pain Onset  More than a month ago    Multiple Pain Sites  No       Airex pad ball tosses to self x2 min, CGA for safety, demonstrated difficulty with catching the ball on its return and required VCs to control the speed of toss to control the return speed Airex pad, balloon passes x2 min, supervision for safety with varying directions and speed of balloon, VCs for utilizing both hands and minimizing UE support Agility ladder Step out, out, in, in forwards x2 laps, backwards x2 laps, CGA for safety, VCs to take big enough steps and to try to increase speed to work on coordination  Side stepping x1 lap each direction, CGA for safety, VCs for taking a big enough step to get both feet into the square   Gait out in hallway, CGA for all activities with VCs for maintaining gait speed and step length with activities: Horizontal head turns x160 ft, calling out cards as walking to give point of focus Direction changes x 100 ft, some difficulty with changing directions quickly and beginning to walk  backwards Speed changes x160 ft, some difficulty with slower gait speed and maintaining balance but good increase in gait speed    Airex pad  Hold ball and trunk rotation   x2 min, CGA for safety, demonstrated difficulty with trunk rotation and keeping arms extended  Airex pad, balloon tapping to mirror  x2 min, supervision for safety with varying directions and speed of balloon, VCs for utilizing both hands and minimizing UE support Agility ladder  Step over hurdle fwd/ bwd, side to side , CGA for safety, VCs to take big enough steps and to try to increase speed to work on coordination  Side stepping x10 on blue balance CGA for safety, VCs for taking a big enough step     CGA and Min verbal cues used throughout with increased in postural sway and LOB most seen with narrow base of support and while on uneven surfaces. Continues to have balance deficits typical with diagnosis. Patient performs advanced level  exercises without pain behaviors and needs verbal cuing for postural alignment and head positioning                      PT Education - 12/17/17 1459    Education provided  Yes    Education Details  balance, safety, HEP    Person(s) Educated  Patient    Methods  Explanation;Tactile cues;Verbal cues    Comprehension  Verbalized understanding;Verbal cues required;Returned demonstration       PT Short Term Goals - 09/23/17 1322      PT SHORT TERM GOAL #1   Title  Patient will be independent in home exercise program to improve strength/mobility for better functional independence with ADLs.    Baseline  Patient is doing well with her exercises    Time  4    Period  Weeks    Status  Achieved      PT SHORT TERM GOAL #2   Title  Patient (> 66 years old) will complete five times sit to stand test in < 15 seconds indicating an increased LE strength and improved balance.    Baseline  22.19 sec 07/31/17; 08/21/17: 19.5s    Time  4    Period  Weeks    Status  On-going        PT Long Term Goals - 12/12/17 1530      PT LONG TERM GOAL #1   Title  Patient will increase six minute walk test distance to >1000 for progression to community ambulator and improve gait ability    Baseline  07/31/17 1085 feet; 08/21/17: 1005', 12/12/17 1175 feet    Time  8    Period  Weeks    Status  Achieved    Target Date  02/06/18      PT LONG TERM GOAL #2   Title  Patient will increase BLE gross strength to 4+/5 as to improve functional strength for independent gait, increased standing tolerance and increased ADL ability.    Baseline  3+/5 BLE hips, 08/21/17: hip flex/ext/abd/add 4/5, hip IR/ER 4+/5, Knee ext: 5/5, Knee flex: 4/4, 09/23/17 knee flex 4+/5, ext 5/5    Time  8    Period  Weeks    Status  Achieved    Target Date  --      PT LONG TERM GOAL #3   Title  Patient will ascend/descend 4 stairs without rail assist independently without loss of balance to improve ability to get in/out of  home.  Baseline  Definite need of railings and slow and guarded; 08/21/17: Need for rail, decreased speed, guarded, 09/23/17 Need for rail, decreased speed, guard:ed:: 12/12/17  Patient takes one step at a time and very guarded and slow uses hands on legs to stabilize.    Time  8    Period  Weeks    Status  Partially Met    Target Date  02/06/18      PT LONG TERM GOAL #4   Title  Patient will reduce timed up and go to <11 seconds to reduce fall risk and demonstrate improved transfer/gait ability.    Baseline  19.87 sec 07/31/17; 08/21/17: 11.8 sec, 09/23/17=12.79 sec, 12/20 sec 10/22/17: 12/08/17=10.50 sec    Time  8    Period  Weeks    Status  Achieved    Target Date  12/12/17      PT LONG TERM GOAL #5   Title  patient will be able to ascend and descend the steps and carry items without railing.     Baseline  unable     Time  8    Period  Weeks    Status  Partially Met    Target Date  02/06/18      Additional Long Term Goals   Additional Long Term Goals  --   Patient will improve her DGI by 3 points to improve functional ambulation.      PT LONG TERM GOAL #6   Title  Patient will improve her DGI by 3 points to improve functional ambulation.     Baseline  DGI 14/24    Time  8    Status  New    Target Date  02/06/18            Plan - 12/17/17 1502    Clinical Impression Statement  Patient's goals were re-assessed today with progression of scores on 6MW, TUG , and new goal for DGI was made. Patient fatigues with ambulation and has decreased gait speed with head turns, stepping over obstacles and turns. Patient's condition has the potential to improve in response to therapy. Frequent cueing for swinging arms allowed patient improved stability with standing tasks. Maximum improvement is yet to be obtained. The anticipated improvement is attainable and reasonable in a generally predictable time. Pt will benefit from additional skilled PT intervention for improvements in balance,  strength, and gait safety    Rehab Potential  Good    PT Frequency  2x / week    PT Duration  8 weeks    PT Treatment/Interventions  Gait training;Therapeutic exercise;Therapeutic activities;Stair training;Balance training;Neuromuscular re-education;Patient/family education;Manual techniques;Aquatic Therapy;Moist Heat;Electrical Stimulation    PT Next Visit Plan  balance and therapeutic exercise for hip weakness    Consulted and Agree with Plan of Care  Patient;Family member/caregiver       Patient will benefit from skilled therapeutic intervention in order to improve the following deficits and impairments:  Abnormal gait, Decreased balance, Decreased endurance, Decreased mobility, Difficulty walking, Decreased knowledge of precautions, Decreased activity tolerance, Decreased coordination, Decreased safety awareness, Decreased strength, Impaired flexibility, Postural dysfunction  Visit Diagnosis: Muscle weakness (generalized)  Unsteadiness on feet  Other lack of coordination  Difficulty in walking, not elsewhere classified     Problem List There are no active problems to display for this patient.   [address] [place] DPT 12/17/2017, 3:03 PM  [place] MAIN [facility] SERVICES [address] [place], [place], [address] Phone: [phone]  Fax:  [phone]  Name: STORMEE DUDA MRN: [id number] Date of Birth: 12-29-1950

## 2017-12-19 ENCOUNTER — Ambulatory Visit: Payer: Medicare Other | Admitting: Physical Therapy

## 2017-12-19 ENCOUNTER — Encounter: Payer: Medicare Other | Admitting: Occupational Therapy

## 2017-12-24 ENCOUNTER — Ambulatory Visit: Payer: Medicare Other | Admitting: Physical Therapy

## 2017-12-24 ENCOUNTER — Encounter: Payer: Medicare Other | Admitting: Occupational Therapy

## 2017-12-24 ENCOUNTER — Encounter: Payer: Self-pay | Admitting: Physical Therapy

## 2017-12-24 DIAGNOSIS — R2681 Unsteadiness on feet: Secondary | ICD-10-CM

## 2017-12-24 DIAGNOSIS — R262 Difficulty in walking, not elsewhere classified: Secondary | ICD-10-CM

## 2017-12-24 DIAGNOSIS — R278 Other lack of coordination: Secondary | ICD-10-CM

## 2017-12-24 DIAGNOSIS — M6281 Muscle weakness (generalized): Secondary | ICD-10-CM | POA: Diagnosis not present

## 2017-12-24 NOTE — Therapy (Signed)
[place] MAIN [facility] SERVICES [address] [place], [place], [address] Phone: [phone]   Fax:  [phone]  Physical Therapy Treatment  Patient Details  Name: Haley Lucas MRN: [id number] Date of Birth: 1950/07/25 Referring Provider: [name]    Encounter Date: 12/24/2017  PT End of Session - 12/24/17 1553    Visit Number  41    Number of Visits  49    Date for PT Re-Evaluation  02/05/18    Authorization Type  1/10    PT Start Time  0347    PT Stop Time  0425    PT Time Calculation (min)  38 min    Equipment Utilized During Treatment  Gait belt    Activity Tolerance  Patient tolerated treatment well    Behavior During Therapy  [facility] for tasks assessed/performed       Past Medical History:  Diagnosis Date  . High cholesterol   . Hypertension   . Thyroid disease     History reviewed. No pertinent surgical history.  There were no vitals filed for this visit.  Subjective Assessment - 12/24/17 1552    Subjective  .Her shoulder L is getting a little bit better.    Pertinent History  She had a hughes flap to her eye right eye feb 18th at [facility]. She was in the hospital for a month from the first surgey and was discharged Dec 11th. She had PT and OT for  2 weeks and then HHPT PT, OT and ST.  Tha ended christmas. She was not using a Assistive devie prior to surgery. She has weakness in her arms and she is not able to walk her normal distances.     Limitations  Standing    How long can you stand comfortably?  10 mins    Patient Stated Goals  to be able to walk and stand for longer periods of time, and improve balance.     Currently in Pain?  Yes    Pain Score  6     Pain Location  Shoulder    Pain Orientation  Left;Right    Pain Descriptors / Indicators  Aching    Pain Type  Chronic pain    Pain Onset  More than a month ago    Multiple Pain Sites  No    Pain Onset  In the past 7 days       Neuromuscular training: Standing  on 1/2 foam and balloon tapping to mirror  Toe tapping 6 inch stool without UE assistfrom foam x 20 , cues for good postureues Tandem gait in // bars x 4 laps;CGA use due to increase postural sway but no LOB. Side stepping on blue foam balance beam x 5 lengths of the parallel bars;Patient needs occasional verbal cueingand CGAto maintain center of gravity during all dynamic standing balance activities.Patient required UE support for side stepping. Standing on 1/2 foam with flat side down and head turns x 20 left and right Resisted walking: 22.5# forward/backward, side/side x3laps each direction with CGA for safety and cues to improve step length for better balance challenge; Required cues to slow down eccentric return for better balance control; 1/2 kneeling and 4 lbs rod trunk rotation left and right x 10 Kneeling and diagonal core strengthening with Y theraball D1 and D2 x 10 x 2  Leg press with 100 lbs x 20 x 2; heel raises with 75 lbs 20 x 2   Patient  tolerated session well; denies any painbut does report some fatigue at end of session                        PT Education - 12/24/17 1553    Education provided  Yes    Education Details  Balance, HEP, safety    Person(s) Educated  Patient    Methods  Explanation;Demonstration    Comprehension  Verbalized understanding;Returned demonstration;Need further instruction       PT Short Term Goals - 09/23/17 1322      PT SHORT TERM GOAL #1   Title  Patient will be independent in home exercise program to improve strength/mobility for better functional independence with ADLs.    Baseline  Patient is doing well with her exercises    Time  4    Period  Weeks    Status  Achieved      PT SHORT TERM GOAL #2   Title  Patient (> 4 years old) will complete five times sit to stand test in < 15 seconds indicating an increased LE strength and improved balance.    Baseline  22.19 sec 07/31/17; 08/21/17: 19.5s     Time  4    Period  Weeks    Status  On-going        PT Long Term Goals - 12/12/17 1530      PT LONG TERM GOAL #1   Title  Patient will increase six minute walk test distance to >1000 for progression to community ambulator and improve gait ability    Baseline  07/31/17 1085 feet; 08/21/17: 1005', 12/12/17 1175 feet    Time  8    Period  Weeks    Status  Achieved    Target Date  02/06/18      PT LONG TERM GOAL #2   Title  Patient will increase BLE gross strength to 4+/5 as to improve functional strength for independent gait, increased standing tolerance and increased ADL ability.    Baseline  3+/5 BLE hips, 08/21/17: hip flex/ext/abd/add 4/5, hip IR/ER 4+/5, Knee ext: 5/5, Knee flex: 4/4, 09/23/17 knee flex 4+/5, ext 5/5    Time  8    Period  Weeks    Status  Achieved    Target Date  --      PT LONG TERM GOAL #3   Title  Patient will ascend/descend 4 stairs without rail assist independently without loss of balance to improve ability to get in/out of home.     Baseline  Definite need of railings and slow and guarded; 08/21/17: Need for rail, decreased speed, guarded, 09/23/17 Need for rail, decreased speed, guard:ed:: 12/12/17  Patient takes one step at a time and very guarded and slow uses hands on legs to stabilize.    Time  8    Period  Weeks    Status  Partially Met    Target Date  02/06/18      PT LONG TERM GOAL #4   Title  Patient will reduce timed up and go to <11 seconds to reduce fall risk and demonstrate improved transfer/gait ability.    Baseline  19.87 sec 07/31/17; 08/21/17: 11.8 sec, 09/23/17=12.79 sec, 12/20 sec 10/22/17: 12/08/17=10.50 sec    Time  8    Period  Weeks    Status  Achieved    Target Date  12/12/17      PT LONG TERM GOAL #5   Title  patient will be able  to ascend and descend the steps and carry items without railing.     Baseline  unable     Time  8    Period  Weeks    Status  Partially Met    Target Date  02/06/18      Additional Long Term Goals   Additional  Long Term Goals  --   Patient will improve her DGI by 3 points to improve functional ambulation.      PT LONG TERM GOAL #6   Title  Patient will improve her DGI by 3 points to improve functional ambulation.     Baseline  DGI 14/24    Time  8    Status  New    Target Date  02/06/18            Plan - 12/24/17 1626    Clinical Impression Statement  Patient required min verbal cueing during matrix machine stepping, and required CGA during all dynamic standing balance activities. Patient required occasional rest breaks between exercises due to fatigue. Patient tolerated exercise well. Patient will continue to benefit from skilled therapy in order to improve dynamic standing balance activities and increase gait speed to reduce risk for falls    Rehab Potential  Good    PT Frequency  2x / week    PT Duration  8 weeks    PT Treatment/Interventions  Gait training;Therapeutic exercise;Therapeutic activities;Stair training;Balance training;Neuromuscular re-education;Patient/family education;Manual techniques;Aquatic Therapy;Moist Heat;Electrical Stimulation    PT Next Visit Plan  balance and therapeutic exercise for hip weakness    Consulted and Agree with Plan of Care  Patient;Family member/caregiver       Patient will benefit from skilled therapeutic intervention in order to improve the following deficits and impairments:  Abnormal gait, Decreased balance, Decreased endurance, Decreased mobility, Difficulty walking, Decreased knowledge of precautions, Decreased activity tolerance, Decreased coordination, Decreased safety awareness, Decreased strength, Impaired flexibility, Postural dysfunction  Visit Diagnosis: Muscle weakness (generalized)  Unsteadiness on feet  Other lack of coordination  Difficulty in walking, not elsewhere classified     Problem List There are no active problems to display for this patient.   [address] [place] DPT 12/24/2017, 4:27 PM  [place] MAIN [facility] SERVICES [address] [place], [place], [address] Phone: [phone]   Fax:  [phone]  Name: Haley Lucas MRN: [id number] Date of Birth: Sep 14, 1950

## 2017-12-26 ENCOUNTER — Encounter: Payer: Self-pay | Admitting: Physical Therapy

## 2017-12-26 ENCOUNTER — Ambulatory Visit: Payer: Medicare Other | Admitting: Physical Therapy

## 2017-12-26 ENCOUNTER — Encounter: Payer: Medicare Other | Admitting: Occupational Therapy

## 2017-12-26 DIAGNOSIS — H543 Unqualified visual loss, both eyes: Secondary | ICD-10-CM

## 2017-12-26 DIAGNOSIS — M6281 Muscle weakness (generalized): Secondary | ICD-10-CM | POA: Diagnosis not present

## 2017-12-26 DIAGNOSIS — R262 Difficulty in walking, not elsewhere classified: Secondary | ICD-10-CM

## 2017-12-26 DIAGNOSIS — R2681 Unsteadiness on feet: Secondary | ICD-10-CM

## 2017-12-26 DIAGNOSIS — R278 Other lack of coordination: Secondary | ICD-10-CM

## 2017-12-26 NOTE — Therapy (Signed)
[place] MAIN [facility] SERVICES [address] [place], [place], [address] Phone: [phone]   Fax:  [phone]  Physical Therapy Treatment  Patient Details  Name: Haley Lucas MRN: [id number] Date of Birth: [birth year] Referring Provider: [name]    Encounter Date: 12/26/2017  PT End of Session - 12/26/17 1519    Visit Number  42    Number of Visits  49    Date for PT Re-Evaluation  02/05/18    Authorization Type  2/10    PT Start Time  0245    PT Stop Time  0325    PT Time Calculation (min)  40 min    Equipment Utilized During Treatment  Gait belt    Activity Tolerance  Patient tolerated treatment well    Behavior During Therapy  [facility] for tasks assessed/performed       Past Medical History:  Diagnosis Date  . High cholesterol   . Hypertension   . Thyroid disease     History reviewed. No pertinent surgical history.  There were no vitals filed for this visit.  Subjective Assessment - 12/26/17 1518    Subjective  .Her shoulder L is getting a little bit better.    Pertinent History  She had a hughes flap to her eye right eye feb 18th at [facility]. She was in the hospital for a month from the first surgey and was discharged Dec 11th. She had PT and OT for  2 weeks and then HHPT PT, OT and ST.  Tha ended christmas. She was not using a Assistive devie prior to surgery. She has weakness in her arms and she is not able to walk her normal distances.     Limitations  Standing    How long can you stand comfortably?  10 mins    Patient Stated Goals  to be able to walk and stand for longer periods of time, and improve balance.     Currently in Pain?  Yes    Pain Score  6     Pain Location  Shoulder    Pain Orientation  Right;Left    Pain Descriptors / Indicators  Aching    Pain Onset  More than a month ago    Pain Frequency  Constant    Aggravating Factors   sleeping    Pain Relieving Factors  nothing    Effect of Pain on Daily  Activities  difficulty sleeping on her side    Multiple Pain Sites  No    Pain Onset  In the past 7 days            Neuromuscular Re-education  Airex balance with toe taps to 6" step alternating LE x 10 each, faded UE support, pt able to perform with 2 finger support at the end of the set; Static balance on 1/2 bolster (flat side up) with no UE support 30 s x 2; Standing on 1/2 bolster (Flat side up) heel/toe rock x15 with no UE hold for balance but intermittent assist from therapist to prevent falls especially with heel rocking and posterior LOB Rockerboard Lateral weight shift x10 reps each direction, no UE support, CGA for safety with VCs to control the board tap in each direction and not letting it hit too hard  AP weight shift x10 reps, no UE support, CGA for safety with VCs to utilize ankles to transfer weight over toes and back over heels without just leaning the trunk Airex  pad ball tosses to self x2 min, CGA for safety, demonstrated difficulty with catching the ball on its return and required VCs to control the speed of toss to control the return speed Airex pad, balloon passes x2 min, supervision for safety with varying directions and speed of balloon, VCs for utilizing both hands and minimizing UE support Agility ladder with Min assist TM walking bwd with min assist x 5 mins . 4 miles / hour Matrix side stepping and bwd walking x 5 mins  CGA and Min to mod verbal cues used throughout with increased in postural sway and LOB most seen with narrow base of support and while on uneven surfaces. Continues to have balance deficits typical with diagnosis. Patient performs intermediate level exercises without pain behaviors and needs verbal cuing for postural alignment and head positioning                  PT Education - 12/26/17 1519    Education provided  Yes    Education Details  Balance , safety, HEP    Person(s) Educated  Patient    Methods  Explanation     Comprehension  Verbalized understanding       PT Short Term Goals - 09/23/17 1322      PT SHORT TERM GOAL #1   Title  Patient will be independent in home exercise program to improve strength/mobility for better functional independence with ADLs.    Baseline  Patient is doing well with her exercises    Time  4    Period  Weeks    Status  Achieved      PT SHORT TERM GOAL #2   Title  Patient (> 27 years old) will complete five times sit to stand test in < 15 seconds indicating an increased LE strength and improved balance.    Baseline  22.19 sec 07/31/17; 08/21/17: 19.5s    Time  4    Period  Weeks    Status  On-going        PT Long Term Goals - 12/12/17 1530      PT LONG TERM GOAL #1   Title  Patient will increase six minute walk test distance to >1000 for progression to community ambulator and improve gait ability    Baseline  07/31/17 1085 feet; 08/21/17: 1005', 12/12/17 1175 feet    Time  8    Period  Weeks    Status  Achieved    Target Date  02/06/18      PT LONG TERM GOAL #2   Title  Patient will increase BLE gross strength to 4+/5 as to improve functional strength for independent gait, increased standing tolerance and increased ADL ability.    Baseline  3+/5 BLE hips, 08/21/17: hip flex/ext/abd/add 4/5, hip IR/ER 4+/5, Knee ext: 5/5, Knee flex: 4/4, 09/23/17 knee flex 4+/5, ext 5/5    Time  8    Period  Weeks    Status  Achieved    Target Date  --      PT LONG TERM GOAL #3   Title  Patient will ascend/descend 4 stairs without rail assist independently without loss of balance to improve ability to get in/out of home.     Baseline  Definite need of railings and slow and guarded; 08/21/17: Need for rail, decreased speed, guarded, 09/23/17 Need for rail, decreased speed, guard:ed:: 12/12/17  Patient takes one step at a time and very guarded and slow uses hands on legs to stabilize.  Time  8    Period  Weeks    Status  Partially Met    Target Date  02/06/18      PT LONG TERM GOAL  #4   Title  Patient will reduce timed up and go to <11 seconds to reduce fall risk and demonstrate improved transfer/gait ability.    Baseline  19.87 sec 07/31/17; 08/21/17: 11.8 sec, 09/23/17=12.79 sec, 12/20 sec 10/22/17: 12/08/17=10.50 sec    Time  8    Period  Weeks    Status  Achieved    Target Date  12/12/17      PT LONG TERM GOAL #5   Title  patient will be able to ascend and descend the steps and carry items without railing.     Baseline  unable     Time  8    Period  Weeks    Status  Partially Met    Target Date  02/06/18      Additional Long Term Goals   Additional Long Term Goals  --   Patient will improve her DGI by 3 points to improve functional ambulation.      PT LONG TERM GOAL #6   Title  Patient will improve her DGI by 3 points to improve functional ambulation.     Baseline  DGI 14/24    Time  8    Status  New    Target Date  02/06/18            Plan - 12/26/17 1524    Clinical Impression Statement  Pt presents with unsteadiness on uneven surfaces and fatigues with therapeutic exercises. Patient needs assist with beginning moderate balance activities and needs CGA assist with standing activities. Patient demonstrates difficulty with dynamic standing balance and with narrow  base of support and increased challenges for LE. Added backwards ambulation challenges and uneven walking surfaces with min assist.  Patient tolerated all interventions well this date and skilled PT will continue to improve mobility and strength.        Patient will benefit from skilled therapeutic intervention in order to improve the following deficits and impairments:     Visit Diagnosis: Muscle weakness (generalized)  Unsteadiness on feet  Other lack of coordination  Difficulty in walking, not elsewhere classified  Low vision, both eyes     Problem List There are no active problems to display for this patient.   [address] , [place] DPT 12/26/2017, 3:25 PM  [place] MAIN [facility] SERVICES [address] [place], [place], [address] Phone: [phone]   Fax:  [phone]  Name: Haley Lucas MRN: [id number] Date of Birth: Dec 29, 1950

## 2017-12-31 ENCOUNTER — Ambulatory Visit: Payer: Medicare Other

## 2017-12-31 ENCOUNTER — Encounter: Payer: Medicare Other | Admitting: Occupational Therapy

## 2017-12-31 DIAGNOSIS — R262 Difficulty in walking, not elsewhere classified: Secondary | ICD-10-CM

## 2017-12-31 DIAGNOSIS — R278 Other lack of coordination: Secondary | ICD-10-CM

## 2017-12-31 DIAGNOSIS — M6281 Muscle weakness (generalized): Secondary | ICD-10-CM | POA: Diagnosis not present

## 2017-12-31 DIAGNOSIS — R2681 Unsteadiness on feet: Secondary | ICD-10-CM

## 2017-12-31 NOTE — Therapy (Addendum)
[place] MAIN [facility] SERVICES [address] [place], [place], [address] Phone: [phone]   Fax:  [phone]  Physical Therapy Treatment  Patient Details  Name: Haley Lucas MRN: [id number] Date of Birth: 11-Mar-1951 Referring Provider: [name]    Encounter Date: 12/31/2017  PT End of Session - 12/31/17 1729    Visit Number  43    Number of Visits  49    Date for PT Re-Evaluation  02/05/18    Authorization Type  3/10    PT Start Time  1518    PT Stop Time  1559    PT Time Calculation (min)  41 min    Equipment Utilized During Treatment  Gait belt    Activity Tolerance  Patient tolerated treatment well    Behavior During Therapy  [facility] for tasks assessed/performed       Past Medical History:  Diagnosis Date  . High cholesterol   . Hypertension   . Thyroid disease     History reviewed. No pertinent surgical history.  There were no vitals filed for this visit.  Subjective Assessment - 12/31/17 1524    Subjective  Patient reports she is doing well today. States she has pain in both shoulders. States she does her HEP sometimes.     Pertinent History  She had a hughes flap to her eye right eye feb 18th at [facility]. She was in the hospital for a month from the first surgey and was discharged Dec 11th. She had PT and OT for  2 weeks and then HHPT PT, OT and ST.  Tha ended christmas. She was not using a Assistive devie prior to surgery. She has weakness in her arms and she is not able to walk her normal distances.     Limitations  Standing    How long can you stand comfortably?  10 mins    Patient Stated Goals  to be able to walk and stand for longer periods of time, and improve balance.     Currently in Pain?  Yes    Pain Score  7     Pain Location  Shoulder    Pain Orientation  Right;Left    Pain Descriptors / Indicators  Aching    Pain Type  Chronic pain    Pain Onset  More than a month ago    Pain Frequency  Constant    Multiple Pain Sites  No        Octane 63mnutes for cardiovascular support  Airex balance with toe taps to 6" step alternating LE x 10 each, faded UE support Airex pad vertical ball tosses to self x2 min, CGA for safety,   Airex pad, balloon taps without UE support and to promote reaching in/out BOS  Gait out in hallway, CGA for all activities with VCs for maintaining gait speed and step length with activities  Normal ambulation [id number] verbal cues to increase step length and step height.   Horizontal head turns x160 ft, with therapist calling out left/right  Vertical head turns [id number] therapist calling out up/down  Speed changes x160 ft, some difficulty with slower gait speed and maintaining balance but good increase in gait speed  Step over hurdle fwd/ bwd. CGA for safety. VCs to take large steps and to land with heel to simulate heel initial heel contact in gait pattern  Side steps over orange hurdle. CGA for safety and no UE support. Verbal cues to maintain  neutral foot position.   Runners step up 6in step. Stepping up and moving hip into flexion for dynamic balance. Patient required UE support occasionally to maintain stabilization.   Figure 4 with 1-2-3-4 step commands to promote motor coordination with corporation of cognitive task.                         PT Education - 12/31/17 1636    Education provided  Yes    Education Details  balance, safety, HEP    Person(s) Educated  Patient    Methods  Explanation    Comprehension  Verbalized understanding;Returned demonstration       PT Short Term Goals - 09/23/17 1322      PT SHORT TERM GOAL #1   Title  Patient will be independent in home exercise program to improve strength/mobility for better functional independence with ADLs.    Baseline  Patient is doing well with her exercises    Time  4    Period  Weeks    Status  Achieved      PT SHORT TERM GOAL #2   Title  Patient (> 14 years  old) will complete five times sit to stand test in < 15 seconds indicating an increased LE strength and improved balance.    Baseline  22.19 sec 07/31/17; 08/21/17: 19.5s    Time  4    Period  Weeks    Status  On-going        PT Long Term Goals - 12/12/17 1530      PT LONG TERM GOAL #1   Title  Patient will increase six minute walk test distance to >1000 for progression to community ambulator and improve gait ability    Baseline  07/31/17 1085 feet; 08/21/17: 1005', 12/12/17 1175 feet    Time  8    Period  Weeks    Status  Achieved    Target Date  02/06/18      PT LONG TERM GOAL #2   Title  Patient will increase BLE gross strength to 4+/5 as to improve functional strength for independent gait, increased standing tolerance and increased ADL ability.    Baseline  3+/5 BLE hips, 08/21/17: hip flex/ext/abd/add 4/5, hip IR/ER 4+/5, Knee ext: 5/5, Knee flex: 4/4, 09/23/17 knee flex 4+/5, ext 5/5    Time  8    Period  Weeks    Status  Achieved    Target Date  --      PT LONG TERM GOAL #3   Title  Patient will ascend/descend 4 stairs without rail assist independently without loss of balance to improve ability to get in/out of home.     Baseline  Definite need of railings and slow and guarded; 08/21/17: Need for rail, decreased speed, guarded, 09/23/17 Need for rail, decreased speed, guard:ed:: 12/12/17  Patient takes one step at a time and very guarded and slow uses hands on legs to stabilize.    Time  8    Period  Weeks    Status  Partially Met    Target Date  02/06/18      PT LONG TERM GOAL #4   Title  Patient will reduce timed up and go to <11 seconds to reduce fall risk and demonstrate improved transfer/gait ability.    Baseline  19.87 sec 07/31/17; 08/21/17: 11.8 sec, 09/23/17=12.79 sec, 12/20 sec 10/22/17: 12/08/17=10.50 sec    Time  8    Period  Weeks  Status  Achieved    Target Date  12/12/17      PT LONG TERM GOAL #5   Title  patient will be able to ascend and descend the steps and carry  items without railing.     Baseline  unable     Time  8    Period  Weeks    Status  Partially Met    Target Date  02/06/18      Additional Long Term Goals   Additional Long Term Goals  --   Patient will improve her DGI by 3 points to improve functional ambulation.      PT LONG TERM GOAL #6   Title  Patient will improve her DGI by 3 points to improve functional ambulation.     Baseline  DGI 14/24    Time  8    Status  New    Target Date  02/06/18            Plan - 01/01/18 0753    Clinical Impression Statement  Patient completed balloon taps and self ball tosses without LOB or needing UE for stabilization. Patient successfully completed figure 4 with commands with 100% accuracy. Patient's dynamic balance was challenged with addition of runners step up with needing to use UE frequently to maintain stabilization. Patient will continue to benefit from skilled physical therapy services to improve balance, gait safety, and reduce fall risk.     Rehab Potential  Good    PT Frequency  2x / week    PT Duration  8 weeks    PT Treatment/Interventions  Gait training;Therapeutic exercise;Therapeutic activities;Stair training;Balance training;Neuromuscular re-education;Patient/family education;Manual techniques;Aquatic Therapy;Moist Heat;Electrical Stimulation    PT Next Visit Plan  balance and therapeutic exercise for hip weakness    Consulted and Agree with Plan of Care  Patient       Patient will benefit from skilled therapeutic intervention in order to improve the following deficits and impairments:  Abnormal gait, Decreased balance, Decreased endurance, Decreased mobility, Difficulty walking, Decreased knowledge of precautions, Decreased activity tolerance, Decreased coordination, Decreased safety awareness, Decreased strength, Impaired flexibility, Postural dysfunction  Visit Diagnosis: Muscle weakness (generalized)  Unsteadiness on feet  Other lack of coordination  Difficulty in  walking, not elsewhere classified     Problem List There are no active problems to display for this patient.  Erick Blinks, SPT  This entire session was performed under direct supervision and direction of a licensed therapist/therapist assistant . I have personally read, edited and approve of the note as written.  Janna Arch, PT, DPT   01/01/2018, 8:03 AM  [place] MAIN [facility] SERVICES [address] [place], [place], [address] Phone: [phone]   Fax:  [phone]  Name: Haley Lucas MRN: [id number] Date of Birth: [birth year]

## 2018-01-02 ENCOUNTER — Encounter: Payer: Medicare Other | Admitting: Occupational Therapy

## 2018-01-02 ENCOUNTER — Encounter: Payer: Self-pay | Admitting: Physical Therapy

## 2018-01-02 ENCOUNTER — Ambulatory Visit: Payer: Medicare Other | Admitting: Physical Therapy

## 2018-01-02 DIAGNOSIS — H543 Unqualified visual loss, both eyes: Secondary | ICD-10-CM

## 2018-01-02 DIAGNOSIS — M6281 Muscle weakness (generalized): Secondary | ICD-10-CM | POA: Diagnosis not present

## 2018-01-02 DIAGNOSIS — R278 Other lack of coordination: Secondary | ICD-10-CM

## 2018-01-02 DIAGNOSIS — R262 Difficulty in walking, not elsewhere classified: Secondary | ICD-10-CM

## 2018-01-02 DIAGNOSIS — R2681 Unsteadiness on feet: Secondary | ICD-10-CM

## 2018-01-02 NOTE — Therapy (Signed)
[place] MAIN [facility] SERVICES [address] [place], [place], [address] Phone: [phone]   Fax:  [phone]  Physical Therapy Treatment  Patient Details  Name: Haley Lucas MRN: [id number] Date of Birth: 1951-01-08 Referring Provider: [name]    Encounter Date: 01/02/2018  PT End of Session - 01/02/18 1451    Visit Number  44    Number of Visits  49    Date for PT Re-Evaluation  02/05/18    Authorization Type  4/10    PT Start Time  0245    PT Stop Time  0330    PT Time Calculation (min)  45 min    Equipment Utilized During Treatment  Gait belt    Activity Tolerance  Patient tolerated treatment well    Behavior During Therapy  [facility] for tasks assessed/performed       Past Medical History:  Diagnosis Date  . High cholesterol   . Hypertension   . Thyroid disease     History reviewed. No pertinent surgical history.  There were no vitals filed for this visit.  Subjective Assessment - 01/02/18 1449    Subjective  Patient reports she is doing well today. States she has pain in both shoulders. States she does her HEP sometimes.     Pertinent History  She had a hughes flap to her eye right eye feb 18th at [facility]. She was in the hospital for a month from the first surgey and was discharged Dec 11th. She had PT and OT for  2 weeks and then HHPT PT, OT and ST.  Tha ended christmas. She was not using a Assistive devie prior to surgery. She has weakness in her arms and she is not able to walk her normal distances.     Limitations  Standing    How long can you stand comfortably?  10 mins    Patient Stated Goals  to be able to walk and stand for longer periods of time, and improve balance.     Currently in Pain?  Yes    Pain Score  6     Pain Location  Shoulder    Pain Orientation  Right;Left    Pain Descriptors / Indicators  Aching    Pain Type  Chronic pain    Pain Onset  More than a month ago    Aggravating Factors   sleeping on  her side    Pain Relieving Factors  nothing    Effect of Pain on Daily Activities  nothing    Multiple Pain Sites  No    Pain Onset  In the past 7 days         Tandem stance, eyes closed, x10 sec holds with each foot in rear x2 bouts each foot, supervision for safety, no UE support -VCs for proper technique and positioning for each exercise   TM walking backwards . 4 miles/ hour, increasing to . 7 miles / hour and 2 UE, 1 UE, no UE support   Step out, out, in, in forwards x2 laps, backwards x2 laps, CGA for safety, VCs to take big enough steps and to try to increase speed to work on coordination   Side stepping x1 lap each direction, CGA for safety, VCs for taking a big enough step to get both feet into the square   Gait out in hallway, CGA for all activities with VCs for maintaining gait speed and step length with activities: -Horizontal  head turns x 500  ft, calling out cards as walking to give point of focus -Vertical head turns x100 ft -Direction changes x100 ft, some difficulty with changing directions quickly and beginning to walk backwards -Speed changes x100 ft, some difficulty with slower gait speed and maintaining balance but good increase in gait speed     Airex pad  Hold ball and trunk rotation x 2 min, CGA for safety, demonstrated difficulty with trunk rotation and keeping arms extended  Airex pad, balloon tapping to mirror  X 2 min, supervision for safety with varying directions and speed of balloon, VCs for utilizing both hands and minimizing UE support   Star stepping right LE and left LE with CGA x 10 each foot tapping the edges of the lines  Step over hurdle fwd/ bwd, side to side , CGA for safety, VCs to take big enough steps and to try to increase speed to work on coordination   Side stepping x10 on blue balance CGA for safety, VCs for taking a big enough step                        PT Education - 01/02/18 1450    Education provided  Yes     Education Details  balance, safety, HEP    Person(s) Educated  Patient    Methods  Explanation;Demonstration;Tactile cues    Comprehension  Verbalized understanding;Returned demonstration       PT Short Term Goals - 09/23/17 1322      PT SHORT TERM GOAL #1   Title  Patient will be independent in home exercise program to improve strength/mobility for better functional independence with ADLs.    Baseline  Patient is doing well with her exercises    Time  4    Period  Weeks    Status  Achieved      PT SHORT TERM GOAL #2   Title  Patient (67 years old) will complete five times sit to stand test in < 15 seconds indicating an increased LE strength and improved balance.    Baseline  22.19 sec 07/31/17; 08/21/17: 19.5s    Time  4    Period  Weeks    Status  On-going        PT Long Term Goals - 12/12/17 1530      PT LONG TERM GOAL #1   Title  Patient will increase six minute walk test distance to >1000 for progression to community ambulator and improve gait ability    Baseline  07/31/17 1085 feet; 08/21/17: 1005', 12/12/17 1175 feet    Time  8    Period  Weeks    Status  Achieved    Target Date  02/06/18      PT LONG TERM GOAL #2   Title  Patient will increase BLE gross strength to 4+/5 as to improve functional strength for independent gait, increased standing tolerance and increased ADL ability.    Baseline  3+/5 BLE hips, 08/21/17: hip flex/ext/abd/add 4/5, hip IR/ER 4+/5, Knee ext: 5/5, Knee flex: 4/4, 09/23/17 knee flex 4+/5, ext 5/5    Time  8    Period  Weeks    Status  Achieved    Target Date  --      PT LONG TERM GOAL #3   Title  Patient will ascend/descend 4 stairs without rail assist independently without loss of balance to improve ability to get in/out of home.  Baseline  Definite need of railings and slow and guarded; 08/21/17: Need for rail, decreased speed, guarded, 09/23/17 Need for rail, decreased speed, guard:ed:: 12/12/17  Patient takes one step at a time and very  guarded and slow uses hands on legs to stabilize.    Time  8    Period  Weeks    Status  Partially Met    Target Date  02/06/18      PT LONG TERM GOAL #4   Title  Patient will reduce timed up and go to <11 seconds to reduce fall risk and demonstrate improved transfer/gait ability.    Baseline  19.87 sec 07/31/17; 08/21/17: 11.8 sec, 09/23/17=12.79 sec, 12/20 sec 10/22/17: 12/08/17=10.50 sec    Time  8    Period  Weeks    Status  Achieved    Target Date  12/12/17      PT LONG TERM GOAL #5   Title  patient will be able to ascend and descend the steps and carry items without railing.     Baseline  unable     Time  8    Period  Weeks    Status  Partially Met    Target Date  02/06/18      Additional Long Term Goals   Additional Long Term Goals  --   Patient will improve her DGI by 3 points to improve functional ambulation.      PT LONG TERM GOAL #6   Title  Patient will improve her DGI by 3 points to improve functional ambulation.     Baseline  DGI 14/24    Time  8    Status  New    Target Date  02/06/18            Plan - 01/02/18 1452    Clinical Impression Statement  Patient required min verbal cueing during matrix machine stepping, and required CGA during all dynamic standing balance activities. Patient required occasional rest breaks between exercises due to fatigue. Patient tolerated exercise well. Patient will continue to benefit from skilled therapy in order to improve dynamic standing balance activities and increase gait speed to reduce risk for falls    Rehab Potential  Good    PT Frequency  2x / week    PT Duration  8 weeks    PT Treatment/Interventions  Gait training;Therapeutic exercise;Therapeutic activities;Stair training;Balance training;Neuromuscular re-education;Patient/family education;Manual techniques;Aquatic Therapy;Moist Heat;Electrical Stimulation    PT Next Visit Plan  balance and therapeutic exercise for hip weakness    Consulted and Agree with Plan of Care   Patient       Patient will benefit from skilled therapeutic intervention in order to improve the following deficits and impairments:  Abnormal gait, Decreased balance, Decreased endurance, Decreased mobility, Difficulty walking, Decreased knowledge of precautions, Decreased activity tolerance, Decreased coordination, Decreased safety awareness, Decreased strength, Impaired flexibility, Postural dysfunction  Visit Diagnosis: Muscle weakness (generalized)  Unsteadiness on feet  Other lack of coordination  Difficulty in walking, not elsewhere classified  Low vision, both eyes     Problem List There are no active problems to display for this patient.   [address], [place] DPT 01/02/2018, 2:53 PM  [place] MAIN [facility] SERVICES [address] [place], [place], [address] Phone: [phone]   Fax:  [phone]  Name: THREASA KINCH MRN: [id number] Date of Birth: 1950-10-16

## 2018-01-07 ENCOUNTER — Encounter: Payer: Medicare Other | Admitting: Occupational Therapy

## 2018-01-07 ENCOUNTER — Encounter: Payer: Self-pay | Admitting: Physical Therapy

## 2018-01-07 ENCOUNTER — Ambulatory Visit: Payer: Medicare Other | Admitting: Physical Therapy

## 2018-01-07 DIAGNOSIS — R278 Other lack of coordination: Secondary | ICD-10-CM

## 2018-01-07 DIAGNOSIS — R262 Difficulty in walking, not elsewhere classified: Secondary | ICD-10-CM

## 2018-01-07 DIAGNOSIS — M6281 Muscle weakness (generalized): Secondary | ICD-10-CM | POA: Diagnosis not present

## 2018-01-07 DIAGNOSIS — R2681 Unsteadiness on feet: Secondary | ICD-10-CM

## 2018-01-07 NOTE — Therapy (Signed)
[place] MAIN [facility] SERVICES [address] [place], [place], [address] Phone: [phone]   Fax:  [phone]  Physical Therapy Treatment  Patient Details  Name: Haley Lucas MRN: [id number] Date of Birth: [birth year] Referring Provider: [name]    Encounter Date: 01/07/2018  PT End of Session - 01/07/18 1434    Visit Number  44    Number of Visits  49    Date for PT Re-Evaluation  02/05/18    Authorization Type  5/10    PT Start Time  0230    PT Stop Time  0310    PT Time Calculation (min)  40 min    Equipment Utilized During Treatment  Gait belt    Activity Tolerance  Patient tolerated treatment well    Behavior During Therapy  [facility] for tasks assessed/performed       Past Medical History:  Diagnosis Date  . High cholesterol   . Hypertension   . Thyroid disease     History reviewed. No pertinent surgical history.  There were no vitals filed for this visit.  Subjective Assessment - 01/07/18 1433    Subjective  Patient reports she is doing well today. States she has pain in both shoulders. States she does her HEP sometimes.     Pertinent History  She had a hughes flap to her eye right eye feb 18th at [facility]. She was in the hospital for a month from the first surgey and was discharged Dec 11th. She had PT and OT for  2 weeks and then HHPT PT, OT and ST.  Tha ended christmas. She was not using a Assistive devie prior to surgery. She has weakness in her arms and she is not able to walk her normal distances.     Limitations  Standing    How long can you stand comfortably?  10 mins    Patient Stated Goals  to be able to walk and stand for longer periods of time, and improve balance.     Currently in Pain?  Yes    Pain Score  7     Pain Onset  More than a month ago    Pain Onset  In the past 7 days        TREATMENT: Warm up on octane fitness level 4 x5  min (Unbilled):  Tm side stepping left and right with elevation 2 and .  4 miles/hour for 2 mins left and right Backward walking on TM . 5 elevation 2 x 4 mins Patient is able to turn from left to right while TM is moving at . 4 miles / hour  Instructed patient in advanced balance exercise: Standing on 2 disk;: Trunk rotation side to side with ball BUE hold and head turning with trunk x 2 mins Tapping balloon to mirror  x 2 mins  feet apart   tandem stance with BUE small red ball up/down x10 each foot in front with CGA for safety  Leg press 90 lbs x 20 x 2    Standing on dina disk:  Tandem stance on airex beam without rail assist 10 sec hold x4 each foot in front with CGA for safety and cues to improve upper trunk control for better balance control;  Standing on rockerboard Feet flat fwd/bwd rock ,side to side x 20 reps with CGA For safety; Patient able to keep balance well with minimal posterior loss of balance  Matrix 17. 5 fwd/bwd, side  to side with CGA and 5 reps   CGA and Min to mod verbal cues used throughout with increased in postural sway and LOB most seen with narrow base of support and while on uneven surfaces. Continues to have balance deficits typical with diagnosis. Patient performs advanced level exercises without pain behaviors and needs verbal cuing for postural alignment and head positioning                        PT Education - 01/07/18 1434    Education provided  Yes    Education Details  HEP, balance , saftey    Person(s) Educated  Patient    Methods  Explanation    Comprehension  Verbalized understanding;Returned demonstration       PT Short Term Goals - 09/23/17 1322      PT SHORT TERM GOAL #1   Title  Patient will be independent in home exercise program to improve strength/mobility for better functional independence with ADLs.    Baseline  Patient is doing well with her exercises    Time  4    Period  Weeks    Status  Achieved      PT SHORT TERM GOAL #2   Title  Patient (> 50 years old) will complete  five times sit to stand test in < 15 seconds indicating an increased LE strength and improved balance.    Baseline  22.19 sec 07/31/17; 08/21/17: 19.5s    Time  4    Period  Weeks    Status  On-going        PT Long Term Goals - 12/12/17 1530      PT LONG TERM GOAL #1   Title  Patient will increase six minute walk test distance to >1000 for progression to community ambulator and improve gait ability    Baseline  07/31/17 1085 feet; 08/21/17: 1005', 12/12/17 1175 feet    Time  8    Period  Weeks    Status  Achieved    Target Date  02/06/18      PT LONG TERM GOAL #2   Title  Patient will increase BLE gross strength to 4+/5 as to improve functional strength for independent gait, increased standing tolerance and increased ADL ability.    Baseline  3+/5 BLE hips, 08/21/17: hip flex/ext/abd/add 4/5, hip IR/ER 4+/5, Knee ext: 5/5, Knee flex: 4/4, 09/23/17 knee flex 4+/5, ext 5/5    Time  8    Period  Weeks    Status  Achieved    Target Date  --      PT LONG TERM GOAL #3   Title  Patient will ascend/descend 4 stairs without rail assist independently without loss of balance to improve ability to get in/out of home.     Baseline  Definite need of railings and slow and guarded; 08/21/17: Need for rail, decreased speed, guarded, 09/23/17 Need for rail, decreased speed, guard:ed:: 12/12/17  Patient takes one step at a time and very guarded and slow uses hands on legs to stabilize.    Time  8    Period  Weeks    Status  Partially Met    Target Date  02/06/18      PT LONG TERM GOAL #4   Title  Patient will reduce timed up and go to <11 seconds to reduce fall risk and demonstrate improved transfer/gait ability.    Baseline  19.87 sec 07/31/17; 08/21/17: 11.8 sec, 09/23/17=12.79 sec,  12/20 sec 10/22/17: 12/08/17=10.50 sec    Time  8    Period  Weeks    Status  Achieved    Target Date  12/12/17      PT LONG TERM GOAL #5   Title  patient will be able to ascend and descend the steps and carry items without  railing.     Baseline  unable     Time  8    Period  Weeks    Status  Partially Met    Target Date  02/06/18      Additional Long Term Goals   Additional Long Term Goals  --   Patient will improve her DGI by 3 points to improve functional ambulation.      PT LONG TERM GOAL #6   Title  Patient will improve her DGI by 3 points to improve functional ambulation.     Baseline  DGI 14/24    Time  8    Status  New    Target Date  02/06/18            Plan - 01/07/18 1437    Clinical Impression Statement  Patient demonstrates wide base of support and decreased dynamic standing balance with advanced challenges though improvements seen in her ability to maintain center of gravity during intermediate challenged dynamic standing balance activities. Patient will continue to benefit from skilled therapy in order to improve dynamic standing balance and endurance.    Rehab Potential  Good    PT Frequency  2x / week    PT Duration  8 weeks    PT Treatment/Interventions  Gait training;Therapeutic exercise;Therapeutic activities;Stair training;Balance training;Neuromuscular re-education;Patient/family education;Manual techniques;Aquatic Therapy;Moist Heat;Electrical Stimulation    PT Next Visit Plan  balance and therapeutic exercise for hip weakness    Consulted and Agree with Plan of Care  Patient       Patient will benefit from skilled therapeutic intervention in order to improve the following deficits and impairments:  Abnormal gait, Decreased balance, Decreased endurance, Decreased mobility, Difficulty walking, Decreased knowledge of precautions, Decreased activity tolerance, Decreased coordination, Decreased safety awareness, Decreased strength, Impaired flexibility, Postural dysfunction  Visit Diagnosis: Muscle weakness (generalized)  Unsteadiness on feet  Other lack of coordination  Difficulty in walking, not elsewhere classified     Problem List There are no active problems to  display for this patient.   [address] [place] DPT 01/07/2018, 2:38 PM  [place] MAIN [facility] SERVICES [address] [place], [place], [address] Phone: [phone]   Fax:  [phone]  Name: Haley Lucas MRN: [id number] Date of Birth: 11-27-1950

## 2018-01-09 ENCOUNTER — Encounter: Payer: Self-pay | Admitting: Physical Therapy

## 2018-01-09 ENCOUNTER — Ambulatory Visit: Payer: Medicare Other | Admitting: Physical Therapy

## 2018-01-09 ENCOUNTER — Encounter: Payer: Medicare Other | Admitting: Occupational Therapy

## 2018-01-09 DIAGNOSIS — H543 Unqualified visual loss, both eyes: Secondary | ICD-10-CM

## 2018-01-09 DIAGNOSIS — M6281 Muscle weakness (generalized): Secondary | ICD-10-CM | POA: Diagnosis not present

## 2018-01-09 DIAGNOSIS — R262 Difficulty in walking, not elsewhere classified: Secondary | ICD-10-CM

## 2018-01-09 DIAGNOSIS — R278 Other lack of coordination: Secondary | ICD-10-CM

## 2018-01-09 DIAGNOSIS — R2681 Unsteadiness on feet: Secondary | ICD-10-CM

## 2018-01-09 NOTE — Therapy (Signed)
[place] MAIN [facility] SERVICES [address] [place], [place], [address] Phone: [phone]   Fax:  [phone]  Physical Therapy Treatment  Patient Details  Name: Haley Lucas MRN: [id number] Date of Birth: 14-Jan-1951 Referring Provider (PT): RO, [place] J    Encounter Date: 01/09/2018  PT End of Session - 01/09/18 1543    Visit Number  45    Number of Visits  49    Date for PT Re-Evaluation  02/05/18    Authorization Type  6/10    PT Start Time  0335    PT Stop Time  0415    PT Time Calculation (min)  40 min    Equipment Utilized During Treatment  Gait belt    Activity Tolerance  Patient tolerated treatment well    Behavior During Therapy  [facility] for tasks assessed/performed       Past Medical History:  Diagnosis Date  . High cholesterol   . Hypertension   . Thyroid disease     History reviewed. No pertinent surgical history.  There were no vitals filed for this visit.    TREATMENT: Warm up onoctane fitnesslevel 4 x5  min (Unbilled):  TM side stepping left and right with elevation 2 and . 6 miles/hour for 2 mins left and right Backward walking on TM . 6 speed ,  elevation 2 x 4 mins Patient is able to turn from left to right while TM is moving at . 6 miles / hour  One foot on yellow disk, one on ball and static stand, alternate feet x 1 min  Foam to floor to hurdle fwd, hurdle bwd to foam step bwd x 10   Standing on 1/2 foam ;: Flat side down and tandem stand and tappingl aterally to stepping stones Trunk rotation side to side with ball BUE hold and head turning with trunk x 2 mins Tapping balloon to mirror  x 2 mins  feet apart  tandem stance with BUE small red ball up/down x10 each foot in front with CGA for safety  Leg press 90 lbs x 20 x 2   Standing on dina disk:  Tandem stance on airex beam without rail assist 10 sec hold x4 each foot in front with CGA for safety and cues to improve upper trunk control for better  balance control;  Standing on rockerboard Feet flat fwd/bwd rock ,side to side x 20 reps with CGA For safety; Patient able to keep balance well with minimal posterior loss of balance  Matrix 17. 5 fwd/bwd, side to side with foam and step ,  CGA and 3 reps   CGA and Min to mod verbal cues used throughout with increased in postural sway and LOB most seen with narrow base of support and while on uneven surfaces. Continues to have balance deficits typical with diagnosis                        PT Education - 01/09/18 1543    Education provided  Yes    Education Details  saftey, balance, HEP    Person(s) Educated  Patient    Methods  Explanation;Demonstration    Comprehension  Verbalized understanding;Returned demonstration       PT Short Term Goals - 09/23/17 1322      PT SHORT TERM GOAL #1   Title  Patient will be independent in home exercise program to improve strength/mobility for better functional independence with ADLs.  Baseline  Patient is doing well with her exercises    Time  4    Period  Weeks    Status  Achieved      PT SHORT TERM GOAL #2   Title  Patient (67 years old) will complete five times sit to stand test in < 15 seconds indicating an increased LE strength and improved balance.    Baseline  22.19 sec 07/31/17; 08/21/17: 19.5s    Time  4    Period  Weeks    Status  On-going        PT Long Term Goals - 12/12/17 1530      PT LONG TERM GOAL #1   Title  Patient will increase six minute walk test distance to >1000 for progression to community ambulator and improve gait ability    Baseline  07/31/17 1085 feet; 08/21/17: 1005', 12/12/17 1175 feet    Time  8    Period  Weeks    Status  Achieved    Target Date  02/06/18      PT LONG TERM GOAL #2   Title  Patient will increase BLE gross strength to 4+/5 as to improve functional strength for independent gait, increased standing tolerance and increased ADL ability.    Baseline  3+/5 BLE hips,  08/21/17: hip flex/ext/abd/add 4/5, hip IR/ER 4+/5, Knee ext: 5/5, Knee flex: 4/4, 09/23/17 knee flex 4+/5, ext 5/5    Time  8    Period  Weeks    Status  Achieved    Target Date  --      PT LONG TERM GOAL #3   Title  Patient will ascend/descend 4 stairs without rail assist independently without loss of balance to improve ability to get in/out of home.     Baseline  Definite need of railings and slow and guarded; 08/21/17: Need for rail, decreased speed, guarded, 09/23/17 Need for rail, decreased speed, guard:ed:: 12/12/17  Patient takes one step at a time and very guarded and slow uses hands on legs to stabilize.    Time  8    Period  Weeks    Status  Partially Met    Target Date  02/06/18      PT LONG TERM GOAL #4   Title  Patient will reduce timed up and go to <11 seconds to reduce fall risk and demonstrate improved transfer/gait ability.    Baseline  19.87 sec 07/31/17; 08/21/17: 11.8 sec, 09/23/17=12.79 sec, 12/20 sec 10/22/17: 12/08/17=10.50 sec    Time  8    Period  Weeks    Status  Achieved    Target Date  12/12/17      PT LONG TERM GOAL #5   Title  patient will be able to ascend and descend the steps and carry items without railing.     Baseline  unable     Time  8    Period  Weeks    Status  Partially Met    Target Date  02/06/18      Additional Long Term Goals   Additional Long Term Goals  --   Patient will improve her DGI by 3 points to improve functional ambulation.      PT LONG TERM GOAL #6   Title  Patient will improve her DGI by 3 points to improve functional ambulation.     Baseline  DGI 14/24    Time  8    Status  New    Target Date  02/06/18            Plan - 01/09/18 1544    Clinical Impression Statement   Pt was able to perform all exercises today with CGA.Marland Kitchen Pt was able to perform all balance and strength exercises, demonstrating improvements in LE strength and stability.  Pt was able to complete dynamic balance exercises, showing ability to stand on even  surfaces with min assist and improve postural reactions to correct self during activities.  Pt requires verbal, visual and tactile cues during exercise in order to complete tasks with proper form and technique, as well as to stay on task.  Pt would continue to benefit from skilled PT services in order to further strengthen LE's, improve static and dynamic balance, and improve coordination in order to increase functional mobility and decrease risk of falls    Rehab Potential  Good    PT Frequency  2x / week    PT Duration  8 weeks    PT Treatment/Interventions  Gait training;Therapeutic exercise;Therapeutic activities;Stair training;Balance training;Neuromuscular re-education;Patient/family education;Manual techniques;Aquatic Therapy;Moist Heat;Electrical Stimulation    PT Next Visit Plan  balance and therapeutic exercise for hip weakness    Consulted and Agree with Plan of Care  Patient       Patient will benefit from skilled therapeutic intervention in order to improve the following deficits and impairments:  Abnormal gait, Decreased balance, Decreased endurance, Decreased mobility, Difficulty walking, Decreased knowledge of precautions, Decreased activity tolerance, Decreased coordination, Decreased safety awareness, Decreased strength, Impaired flexibility, Postural dysfunction  Visit Diagnosis: Muscle weakness (generalized)  Unsteadiness on feet  Other lack of coordination  Difficulty in walking, not elsewhere classified  Low vision, both eyes     Problem List There are no active problems to display for this patient.   [address] [place] DPT 01/09/2018, 3:46 PM  [place] MAIN [facility] SERVICES [address] [place], [place], [address] Phone: [phone]   Fax:  [phone]  Name: RICKA WESTRA MRN: [id number] Date of Birth: 1951/02/29

## 2018-01-14 ENCOUNTER — Ambulatory Visit: Payer: Medicare Other | Attending: Family Medicine | Admitting: Physical Therapy

## 2018-01-14 ENCOUNTER — Encounter: Payer: Self-pay | Admitting: Physical Therapy

## 2018-01-14 DIAGNOSIS — M25612 Stiffness of left shoulder, not elsewhere classified: Secondary | ICD-10-CM | POA: Insufficient documentation

## 2018-01-14 DIAGNOSIS — M6281 Muscle weakness (generalized): Secondary | ICD-10-CM

## 2018-01-14 DIAGNOSIS — M25512 Pain in left shoulder: Secondary | ICD-10-CM | POA: Insufficient documentation

## 2018-01-14 DIAGNOSIS — R262 Difficulty in walking, not elsewhere classified: Secondary | ICD-10-CM | POA: Diagnosis present

## 2018-01-14 DIAGNOSIS — M25611 Stiffness of right shoulder, not elsewhere classified: Secondary | ICD-10-CM | POA: Diagnosis present

## 2018-01-14 DIAGNOSIS — G8929 Other chronic pain: Secondary | ICD-10-CM | POA: Insufficient documentation

## 2018-01-14 DIAGNOSIS — M25511 Pain in right shoulder: Secondary | ICD-10-CM | POA: Insufficient documentation

## 2018-01-14 DIAGNOSIS — R278 Other lack of coordination: Secondary | ICD-10-CM

## 2018-01-14 DIAGNOSIS — R2681 Unsteadiness on feet: Secondary | ICD-10-CM | POA: Insufficient documentation

## 2018-01-14 NOTE — Therapy (Signed)
[place] MAIN [facility] SERVICES [address] [place], [place], [address] Phone: [phone]   Fax:  [phone]  Physical Therapy Treatment/Discharge Summary  Patient Details  Name: Haley Lucas MRN: [id number] Date of Birth: 1951-03-07 Referring Provider (PT): RO, [place] J    Encounter Date: 01/14/2018  PT End of Session - 01/14/18 1531    Visit Number  46    Number of Visits  49    Date for PT Re-Evaluation  02/05/18    Authorization Type  7/10    PT Start Time  6754    PT Stop Time  1542    PT Time Calculation (min)  26 min    Equipment Utilized During Treatment  Gait belt    Activity Tolerance  Patient tolerated treatment well    Behavior During Therapy  [facility] for tasks assessed/performed       Past Medical History:  Diagnosis Date  . High cholesterol   . Hypertension   . Thyroid disease     History reviewed. No pertinent surgical history.  There were no vitals filed for this visit.  Subjective Assessment - 01/14/18 1530    Subjective  Patient reports increased shoulder pain; Reports getting x-ray, unsure of results; has new order for PT for her shoulder pain; Denies any unsteadiness when walking in the last week;     Pertinent History  She had a hughes flap to her eye right eye feb 18th at [facility]. She was in the hospital for a month from the first surgey and was discharged Dec 11th. She had PT and OT for  2 weeks and then HHPT PT, OT and ST.  Tha ended christmas. She was not using a Assistive devie prior to surgery. She has weakness in her arms and she is not able to walk her normal distances.     Limitations  Standing    How long can you stand comfortably?  10 mins    Patient Stated Goals  to be able to walk and stand for longer periods of time, and improve balance.     Currently in Pain?  Yes    Pain Score  7     Pain Location  Shoulder    Pain Orientation  Right;Left    Pain Descriptors / Indicators  Tightness;Aching    Pain Type  Chronic pain    Pain Onset  More than a month ago    Pain Frequency  Constant    Aggravating Factors   sleeping on her side    Pain Relieving Factors  nothing    Effect of Pain on Daily Activities  nothing;     Multiple Pain Sites  No    Pain Onset  In the past 7 days         [facility] PT Assessment - 01/14/18 0001      Dynamic Gait Index   Level Surface  Normal    Change in Gait Speed  Mild Impairment    Gait with Horizontal Head Turns  Mild Impairment    Gait with Vertical Head Turns  Mild Impairment    Gait and Pivot Turn  Normal    Step Over Obstacle  Mild Impairment    Step Around Obstacles  Normal    Steps  Moderate Impairment    Total Score  18       Instructed patient in outcome measures to update goals: 6 min walk, stair negotiation, DGI Patient required min VCs  for correct activity; able to exhibit good gait safety with no unsteadiness.      Outcome measure 12/12/17 01/14/18 Interpretation  6 min walk 1175 feet 1260 feet Community ambulator, less than age group norms of 1750 feet;   DGI 14/24 18/24 <19 indicates increased risk for falls  Stair negotiation One step at a time, with rail Able to negotiate reciprocally but requires rail assist     Patient has met most goals and is appropriate for discharge from PT at this time. Patient agreeable with the understanding that she will pursue PT evaluation for shoulder pain in the future.                  PT Education - 01/14/18 1531    Education provided  Yes    Education Details  progress towards goals, recommendation;     Person(s) Educated  Patient    Methods  Explanation    Comprehension  Verbalized understanding       PT Short Term Goals - 09/23/17 1322      PT SHORT TERM GOAL #1   Title  Patient will be independent in home exercise program to improve strength/mobility for better functional independence with ADLs.    Baseline  Patient is doing well with her exercises    Time  4     Period  Weeks    Status  Achieved      PT SHORT TERM GOAL #2   Title  Patient (> 61 years old) will complete five times sit to stand test in < 15 seconds indicating an increased LE strength and improved balance.    Baseline  22.19 sec 07/31/17; 08/21/17: 19.5s    Time  4    Period  Weeks    Status  On-going        PT Long Term Goals - 01/14/18 1541      PT LONG TERM GOAL #1   Title  Patient will increase six minute walk test distance to >1000 for progression to community ambulator and improve gait ability    Baseline  07/31/17 1085 feet; 08/21/17: 1005', 12/12/17 1175 feet    Time  8    Period  Weeks    Status  Achieved      PT LONG TERM GOAL #2   Title  Patient will increase BLE gross strength to 4+/5 as to improve functional strength for independent gait, increased standing tolerance and increased ADL ability.    Baseline  3+/5 BLE hips, 08/21/17: hip flex/ext/abd/add 4/5, hip IR/ER 4+/5, Knee ext: 5/5, Knee flex: 4/4, 09/23/17 knee flex 4+/5, ext 5/5    Time  8    Period  Weeks    Status  Achieved      PT LONG TERM GOAL #3   Title  Patient will ascend/descend 4 stairs without rail assist independently without loss of balance to improve ability to get in/out of home.     Baseline  Definite need of railings and slow and guarded; 08/21/17: Need for rail, decreased speed, guarded, 09/23/17 Need for rail, decreased speed, guard:ed:: 12/12/17  Patient takes one step at a time and very guarded and slow uses hands on legs to stabilize.01/14/18: reciprocally but requires rail assist; min A without rail;     Time  8    Period  Weeks    Status  Partially Met      PT LONG TERM GOAL #4   Title  Patient will reduce timed up and go  to <11 seconds to reduce fall risk and demonstrate improved transfer/gait ability.    Baseline  19.87 sec 07/31/17; 08/21/17: 11.8 sec, 09/23/17=12.79 sec, 12/20 sec 10/22/17: 12/08/17=10.50 sec    Time  8    Period  Weeks    Status  Achieved      PT LONG TERM GOAL #5   Title   patient will be able to ascend and descend the steps and carry items without railing.     Baseline  10/1- needs rail    Time  8    Period  Weeks    Status  Partially Met      PT LONG TERM GOAL #6   Title  Patient will improve her DGI by 3 points to improve functional ambulation.     Baseline  DGI 14/24, 10/1: 18/24    Time  8    Status  Achieved            Plan - 01/14/18 1635    Clinical Impression Statement  Patient exhibits significant improvement in her balance. Instructed patient in DGI. She is ambulating independently with slightly slower gait speed, but is able to keep balance when negotiating obstacles/turning head. Patient does continue to need rail assist when negotiating steps. However she reports that this was her PLOF and states, "I feel more comfortable with rails." Patient has met most goals and is appropriate for discharge from PT at this time. she does report increased shoulder pain and brought a new referral for PT eval and treat for shoulder pain. Plan on evaluating shoulder pain in the future.     Rehab Potential  Good    PT Frequency  2x / week    PT Duration  8 weeks    PT Treatment/Interventions  Gait training;Therapeutic exercise;Therapeutic activities;Stair training;Balance training;Neuromuscular re-education;Patient/family education;Manual techniques;Aquatic Therapy;Moist Heat;Electrical Stimulation    PT Next Visit Plan  balance and therapeutic exercise for hip weakness    Consulted and Agree with Plan of Care  Patient       Patient will benefit from skilled therapeutic intervention in order to improve the following deficits and impairments:  Abnormal gait, Decreased balance, Decreased endurance, Decreased mobility, Difficulty walking, Decreased knowledge of precautions, Decreased activity tolerance, Decreased coordination, Decreased safety awareness, Decreased strength, Impaired flexibility, Postural dysfunction  Visit Diagnosis: Muscle weakness  (generalized)  Unsteadiness on feet  Other lack of coordination  Difficulty in walking, not elsewhere classified     Problem List There are no active problems to display for this patient.   Trotter,Margaret PT, DPT 01/14/2018, 4:39 PM  [place] MAIN [facility] SERVICES [address] [place], [place], [address] Phone: [phone]   Fax:  [phone]  Name: Haley Lucas MRN: [id number] Date of Birth: Dec 13, 1950

## 2018-01-16 ENCOUNTER — Other Ambulatory Visit: Payer: Self-pay

## 2018-01-16 ENCOUNTER — Ambulatory Visit: Payer: Medicare Other | Admitting: Physical Therapy

## 2018-01-16 ENCOUNTER — Encounter: Payer: Self-pay | Admitting: Physical Therapy

## 2018-01-16 DIAGNOSIS — M25512 Pain in left shoulder: Principal | ICD-10-CM

## 2018-01-16 DIAGNOSIS — M6281 Muscle weakness (generalized): Secondary | ICD-10-CM | POA: Diagnosis not present

## 2018-01-16 DIAGNOSIS — M25612 Stiffness of left shoulder, not elsewhere classified: Secondary | ICD-10-CM

## 2018-01-16 DIAGNOSIS — G8929 Other chronic pain: Secondary | ICD-10-CM

## 2018-01-16 DIAGNOSIS — M25611 Stiffness of right shoulder, not elsewhere classified: Secondary | ICD-10-CM

## 2018-01-16 DIAGNOSIS — M25511 Pain in right shoulder: Secondary | ICD-10-CM

## 2018-01-16 NOTE — Therapy (Signed)
[place] [facility] MAIN [facility] SERVICES [address] [place], [place], [address] Phone: [phone]   Fax:  [phone]  Physical Therapy Evaluation  Patient Details  Name: Haley Lucas MRN: [id number] Date of Birth: 1950/08/11 Referring Provider (PT): Rosemarie Ax   Encounter Date: 01/16/2018  PT End of Session - 01/16/18 1727    Visit Number  1    Number of Visits  13    Date for PT Re-Evaluation  02/27/18    Authorization Type  1/10    PT Start Time  1518    PT Stop Time  1559    PT Time Calculation (min)  41 min    Activity Tolerance  Patient tolerated treatment well;Patient limited by pain    Behavior During Therapy  [facility] for tasks assessed/performed       Past Medical History:  Diagnosis Date  . High cholesterol   . Hypertension   . Thyroid disease     History reviewed. No pertinent surgical history.  There were no vitals filed for this visit.   Subjective Assessment - 01/16/18 1526    Subjective  Patient presents with c/o of pain in B shoulders and limitations in UE mobility and function for ADLs.    Pertinent History  L shoulder pain starting Feb 2019, R shoulder pain starting June 2019. No confirmed MOI, but family member reported pain and stiffness is 2/2 to recovery s/p acoustic neuroma.    Limitations  Lifting;House hold activities    How long can you sit comfortably?  NA    How long can you stand comfortably?  >30 minutes    How long can you walk comfortably?  >1052ft    Diagnostic tests  Xrays of B shoulder (9/27)- normal    Patient Stated Goals  reduce shoulder pain,     Currently in Pain?  Yes    Pain Score  7     Pain Location  Shoulder    Pain Orientation  Right;Left    Pain Descriptors / Indicators  Tightness;Shooting    Pain Type  Chronic pain    Pain Onset  More than a month ago    Pain Frequency  Constant    Aggravating Factors   GH IR, extension, flexion, and L horizontal adduction    Pain Relieving Factors   heat, pain meds    Effect of Pain on Daily Activities  no significant changes          [facility] PT Assessment - 01/16/18 0001      Assessment   Medical Diagnosis  adhesive capsulitis    Referring Provider (PT)  Rosemarie Ax    Onset Date/Surgical Date  05/17/17    Hand Dominance  Right    Prior Therapy  HHPT, inpatient PT, outpatient PT for acoustic neuroma, but not PT for shoulder pain      Home Environment   Living Environment  Private residence    Living Arrangements  Spouse/significant other    Available Help at Discharge  Family    Type of Home  House    Home Access  Stairs to enter    Entrance Stairs-Number of Steps  3    Entrance Stairs-Rails  Right    Home Layout  One level    Home Equipment  Walker - 2 wheels;Cane - single point      Observation/Other Assessments   Quick DASH   54% (moderate disability)      Sensation   Light  Touch  Appears Intact      Posture/Postural Control   Posture/Postural Control  No significant limitations      AROM   Overall AROM Comments  increased shrug noted (L>R)    Right/Left Shoulder  Right;Left    Right Shoulder Extension  51 Degrees    Right Shoulder Flexion  133 Degrees    Right Shoulder ABduction  106 Degrees    Right Shoulder Internal Rotation  39 Degrees    Right Shoulder External Rotation  36 Degrees    Left Shoulder Extension  48 Degrees    Left Shoulder Flexion  124 Degrees    Left Shoulder ABduction  109 Degrees    Left Shoulder Internal Rotation  38 Degrees    Left Shoulder External Rotation  21 Degrees      Strength   Overall Strength Comments  B Shoulder flexion 3-/5, aBDuction 2+/5 R, 3-/5 L      Palpation   Palpation comment  L GH more hypomobile than R, both with noted stiffness. AC joint tenderness on L.  R bicep tenderness.         Objective measurements completed on examination: See above findings.   TREATMENT Pulleys 2 min in flexion Pulleys 2 min in scaption  Patient educated on HEP and  provided with pulley for home use.  PT Short Term Goals - 01/16/18 1710      PT SHORT TERM GOAL #1   Title  Patient will be able to achieve B active assisted shoulder flexion >140 degrees without pain in order to promote functional independence with ADLs.    Baseline  L 124, R 133    Time  2    Period  Weeks    Status  New    Target Date  01/30/18      PT SHORT TERM GOAL #2   Title  Patient will report a worst pain of 4/10 on NPRS in order to improve tolerance with ADLs and reduced symptoms with activities.     Baseline  7/10    Time  2    Period  Weeks    Status  New    Target Date  01/30/18        PT Long Term Goals - 01/16/18 1716      PT LONG TERM GOAL #1   Title  Patient will report a worst pain of 0/10 on NPRS in order to improve tolerance with ADLs and reduced symptoms with activities.     Baseline  7/10    Time  6    Period  Weeks    Status  New    Target Date  02/27/18      PT LONG TERM GOAL #2   Title  Patient will increase BUE gross strength to 4+/5 in order to improve functional strength and promote independence with all ADLs.    Baseline  3-/5    Time  6    Period  Weeks    Status  New    Target Date  02/27/18      PT LONG TERM GOAL #3   Title  Patient will be independent with all upper body hygiene and dressing pain-free in order to promote independence.    Baseline  Unable    Time  6    Period  Weeks    Status  New    Target Date  02/27/18      PT LONG TERM GOAL #4   Title  Patient  will improve Quick DASH score from 54% (moderate disability) to at most 46% in order to improve functional ability in ADLs.    Baseline  54% (moderate disability)    Time  6    Period  Weeks    Status  New    Target Date  02/27/18             Plan - 01/16/18 1659    Clinical Impression Statement Patient is a pleasant 67 year old female presenting to clinic with increased pain in B shoulders and decreased function 2/2 to ROM and strength limitations. Patient  demonstrates moderate disability as evidenced by a Quick DASH score of 54%, assistance required for upper body dressing and hygiene, as well as decreased strength 2/2 to limitations in B shoulder AROM, joint stiffness, and pain. Patient will benefit from skilled therapeutic intervention to address deficits in pain, ROM, strength, and function in order to promote independence and improve overall QOL.   History and Personal Factors relevant to plan of care:  strong family support, independent with ADLs    Clinical Presentation  Stable    Clinical Presentation due to:  no structural abnormalities noted in imaging, no radiating symptoms, independent with ADLs, gross function not impaired    Clinical Decision Making  Low    Rehab Potential  Good    Clinical Impairments Affecting Rehab Potential  pain, decreased ROM, strength deficits    PT Frequency  2x / week    PT Duration  6 weeks    PT Treatment/Interventions  Ultrasound;Moist Heat;Cryotherapy;Therapeutic activities;Therapeutic exercise;Neuromuscular re-education;Patient/family education;Manual techniques;Joint Manipulations;Dry needling;Taping    PT Next Visit Plan  ROM, manual interventions, and strengthening    PT Home Exercise Plan  pulleys in scaption and flexion    Consulted and Agree with Plan of Care  Patient;Family member/caregiver    Family Member Consulted  [name]       Patient will benefit from skilled therapeutic intervention in order to improve the following deficits and impairments:  Decreased range of motion, Decreased strength, Impaired UE functional use, Pain, Hypomobility, Impaired perceived functional ability  Visit Diagnosis: Chronic left shoulder pain  Chronic right shoulder pain  Stiffness of right shoulder, not elsewhere classified  Muscle weakness (generalized)  Stiffness of left shoulder, not elsewhere classified     Problem List There are no active problems to display for this patient.  Dossie Arbour, SPT This entire session was performed under direct supervision and direction of a licensed therapist/therapist assistant . I have personally read, edited and approve of the note as written.  Sheria Lang PT, DPT [phone]  01/16/2018, 5:41 PM  [place] [facility] MAIN [facility] SERVICES [address] [place], [place], [address] Phone: [phone]   Fax:  [phone]  Name: ALLIAH BOULANGER MRN: [id number] Date of Birth: 02-26-1951

## 2018-01-16 NOTE — Patient Instructions (Signed)
Access Code: [email or web address]  Patient Portal: [email or web address]

## 2018-01-21 ENCOUNTER — Encounter: Payer: Medicare Other | Admitting: Physical Therapy

## 2018-01-23 ENCOUNTER — Ambulatory Visit: Payer: Medicare Other

## 2018-01-23 DIAGNOSIS — M25611 Stiffness of right shoulder, not elsewhere classified: Secondary | ICD-10-CM

## 2018-01-23 DIAGNOSIS — M25612 Stiffness of left shoulder, not elsewhere classified: Secondary | ICD-10-CM

## 2018-01-23 DIAGNOSIS — G8929 Other chronic pain: Secondary | ICD-10-CM

## 2018-01-23 DIAGNOSIS — M6281 Muscle weakness (generalized): Secondary | ICD-10-CM

## 2018-01-23 DIAGNOSIS — M25511 Pain in right shoulder: Secondary | ICD-10-CM

## 2018-01-23 DIAGNOSIS — M25512 Pain in left shoulder: Principal | ICD-10-CM

## 2018-01-23 NOTE — Therapy (Signed)
[facility] MAIN [facility] SERVICES [address] [place], [place], [address] Phone: [phone]   Fax:  [phone]  Physical Therapy Treatment  Patient Details  Name: Haley Lucas MRN: [id number] Date of Birth: [birth year] Referring Provider (PT): Rosemarie Ax   Encounter Date: 01/23/2018  PT End of Session - 01/23/18 1520    Visit Number  2    Number of Visits  13    Date for PT Re-Evaluation  02/27/18    Authorization Type  2/10    PT Start Time  1515    PT Stop Time  1557    PT Time Calculation (min)  42 min    Activity Tolerance  Patient tolerated treatment well;Patient limited by pain    Behavior During Therapy  [facility] for tasks assessed/performed       Past Medical History:  Diagnosis Date  . High cholesterol   . Hypertension   . Thyroid disease     History reviewed. No pertinent surgical history.  There were no vitals filed for this visit.  Subjective Assessment - 01/23/18 1518    Subjective  Patient reports she has been having abdominal pain but went to doctor for it and was cleared. Has been compliant with her HEP with pulleys. Has pain in shoulders that worsens with movement, especially reaching backwards.     Pertinent History  L shoulder pain starting Feb 2019, R shoulder pain starting June 2019. No confirmed MOI, but family member reported pain and stiffness is 2/2 to recovery s/p acoustic neuroma.    Limitations  Lifting;House hold activities    How long can you sit comfortably?  NA    How long can you stand comfortably?  >30 minutes    How long can you walk comfortably?  >1079ft    Diagnostic tests  Xrays of B shoulder (9/27)- normal    Patient Stated Goals  reduce shoulder pain,     Currently in Pain?  Yes    Pain Score  7     Pain Location  Shoulder    Pain Orientation  Right;Left    Pain Descriptors / Indicators  Tightness;Shooting    Pain Onset  More than a month ago    Pain Frequency  Constant       Warm up  on UBE BUE backwards only level 3 x 3 min (Unbilled):  PT instructed patient in postural strengthening exercise to improve glenohumeral joint positioning:  Standing with red tband in BUE:  BUE shoulder extension x10 reps ; tactile and demonstrative cueing for body mechanics, occasional shoulder hike requiring cueing to decrease UE trap activation   BUE scapular retraction, low rows x10 reps,; requires hands on tactile cueing to scapula for retraction,  adduction rather than elevation   Patient required min VCs for correct exercise technique including to increase scapular retraction and improve erect posture for better exercise technique and to reduce discomfort;  Patient seated: Hands on knees, slowly extend to drag hands up to hip/behind back for GH mobility and upright posture, x 3 minutes  Green Swiss ball on mat table, patient seated at chair in front of mat: forward flexion 10x, lateral flexion to R 10x, lateral flexion to L. ; verbal cues for chin tuck. Require tactile cueing for body mechanics initially.   Patient supine; PT performed grade II-III inferior glenohumeral joint mobilizations 20 sec bouts x5 sets bilaterally;  PT performed grade II-III anterior/posterior glenohumeral joint mobilization 20 sec bouts x5 sets bilaterally;  Mobilization with movement, AP with distractive forces with cross body (horizontal adduction) 3 minutes   PT performed PROM to glenohumeral joint flexion, abduction, ER/IR, x7 reps each direction bilaterally;  Rhythmic perturbations between PROM for muscle relaxation.   Rhythmic perturbations with distraction to reduce pain 10x 5 second holds each UE.    Patient required min VCs to relax for better joint mobility; She denies any pain with manual therapy;                          PT Education - 01/23/18 1520    Education provided  Yes    Education Details  exercise technique, manual, posture     Person(s) Educated  Patient     Methods  Explanation;Demonstration;Verbal cues    Comprehension  Verbalized understanding;Returned demonstration       PT Short Term Goals - 01/16/18 1710      PT SHORT TERM GOAL #1   Title  Patient will be able to achieve B active assisted shoulder flexion >140 degrees without pain in order to promote functional independence with ADLs.    Baseline  L 124, R 133    Time  2    Period  Weeks    Status  New    Target Date  01/30/18      PT SHORT TERM GOAL #2   Title  Patient will report a worst pain of 4/10 on NPRS in order to improve tolerance with ADLs and reduced symptoms with activities.     Baseline  7/10    Time  2    Period  Weeks    Status  New    Target Date  01/30/18        PT Long Term Goals - 01/16/18 1716      PT LONG TERM GOAL #1   Title  Patient will report a worst pain of 0/10 on NPRS in order to improve tolerance with ADLs and reduced symptoms with activities.     Baseline  7/10    Time  6    Period  Weeks    Status  New    Target Date  02/27/18      PT LONG TERM GOAL #2   Title  Patient will increase BUE gross strength to 4+/5 in order to improve functional strength and promote independence with all ADLs.    Baseline  3-/5    Time  6    Period  Weeks    Status  New    Target Date  02/27/18      PT LONG TERM GOAL #3   Title  Patient will be independent with all upper body hygiene and dressing pain-free in order to promote independence.    Baseline  Unable    Time  6    Period  Weeks    Status  New    Target Date  02/27/18      PT LONG TERM GOAL #4   Title  Patient will improve Quick DASH score from 54% (moderate disability) to at most 46% in order to improve functional ability in ADLs.    Baseline  54% (moderate disability)    Time  6    Period  Weeks    Status  New    Target Date  02/27/18            Plan - 01/23/18 1612    Clinical Impression Statement  Patient exhibits muscle guarding  of bilateral UE  Limiting GH range of  functional motion. Use of rhythmic pertubation's allowed for reduction of muscle tension resulting in improved movement pattern with repetition.  Patient's pain was reduced from 7/10 to 5/10 by end of session. Patient has difficulty performing strengthening interventions without shoulder hike requiring tactile cueing for corrective body mechanics. Patient will benefit from skilled therapeutic intervention to address deficits in pain, ROM, strength, and function in order to promote independence and improve overall QOL.    Rehab Potential  Good    Clinical Impairments Affecting Rehab Potential  pain, decreased ROM, strength deficits    PT Frequency  2x / week    PT Duration  6 weeks    PT Treatment/Interventions  Ultrasound;Moist Heat;Cryotherapy;Therapeutic activities;Therapeutic exercise;Neuromuscular re-education;Patient/family education;Manual techniques;Joint Manipulations;Dry needling;Taping    PT Next Visit Plan  ROM, manual interventions, and strengthening    PT Home Exercise Plan  pulleys in scaption and flexion    Consulted and Agree with Plan of Care  Patient;Family member/caregiver    Family Member Consulted  [name]       Patient will benefit from skilled therapeutic intervention in order to improve the following deficits and impairments:  Decreased range of motion, Decreased strength, Impaired UE functional use, Pain, Hypomobility, Impaired perceived functional ability  Visit Diagnosis: Chronic left shoulder pain  Chronic right shoulder pain  Stiffness of right shoulder, not elsewhere classified  Muscle weakness (generalized)  Stiffness of left shoulder, not elsewhere classified     Problem List There are no active problems to display for this patient.  Precious Bard, PT, DPT   01/23/2018, 4:14 PM  [facility] MAIN [facility] SERVICES [address] [place], [place], [address] Phone: [phone]   Fax:  [phone]  Name:  EILEY MCGINNITY MRN: [id number] Date of Birth: [birth year]

## 2018-01-28 ENCOUNTER — Encounter: Payer: Self-pay | Admitting: Physical Therapy

## 2018-01-28 ENCOUNTER — Ambulatory Visit: Payer: Medicare Other | Admitting: Physical Therapy

## 2018-01-28 DIAGNOSIS — M25612 Stiffness of left shoulder, not elsewhere classified: Secondary | ICD-10-CM

## 2018-01-28 DIAGNOSIS — G8929 Other chronic pain: Secondary | ICD-10-CM

## 2018-01-28 DIAGNOSIS — M25511 Pain in right shoulder: Secondary | ICD-10-CM

## 2018-01-28 DIAGNOSIS — M25512 Pain in left shoulder: Principal | ICD-10-CM

## 2018-01-28 DIAGNOSIS — M6281 Muscle weakness (generalized): Secondary | ICD-10-CM | POA: Diagnosis not present

## 2018-01-28 DIAGNOSIS — M25611 Stiffness of right shoulder, not elsewhere classified: Secondary | ICD-10-CM

## 2018-01-28 NOTE — Therapy (Addendum)
[facility] MAIN [facility] SERVICES [address] [place], [place], [address] Phone: [phone]   Fax:  [phone]  Physical Therapy Treatment  Patient Details  Name: Haley Lucas MRN: [id number] Date of Birth: Sep 17, 1950 Referring Provider (PT): Haley Lucas   Encounter Date: 01/28/2018  PT End of Session - 01/28/18 1445    Visit Number  3    Number of Visits  13    Date for PT Re-Evaluation  02/27/18    Authorization Type  3/10    PT Start Time  1445    PT Stop Time  1530    PT Time Calculation (min)  45 min    Activity Tolerance  Patient tolerated treatment well;Patient limited by pain    Behavior During Therapy  [facility] for tasks assessed/performed       Past Medical History:  Diagnosis Date  . High cholesterol   . Hypertension   . Thyroid disease     History reviewed. No pertinent surgical history.  There were no vitals filed for this visit.  Subjective Assessment - 01/28/18 1451    Subjective  Patient reports she is doing well but continues to have shoulder pain; states she has been performing HEP with pulleys at home. No questions currently.     Pertinent History  L shoulder pain starting Feb 2019, R shoulder pain starting June 2019. No confirmed MOI, but family member reported pain and stiffness is 2/2 to recovery s/p acoustic neuroma.    Limitations  Lifting;House hold activities    How long can you sit comfortably?  NA    How long can you stand comfortably?  >30 minutes    How long can you walk comfortably?  >1076ft    Diagnostic tests  Xrays of B shoulder (9/27)- normal    Patient Stated Goals  reduce shoulder pain,     Currently in Pain?  Yes    Pain Score  7     Pain Location  Shoulder    Pain Orientation  Right;Left    Pain Descriptors / Indicators  Tightness;Shooting    Pain Type  Chronic pain    Pain Onset  More than a month ago    Pain Frequency  Constant    Aggravating Factors   Reaching behind back, reaching  overhead    Pain Relieving Factors  heat, pain meds    Effect of Pain on Daily Activities  no significant changes    Multiple Pain Sites  No          Treatment Warm up on UBE BUE forwards/backwards level 3 x 3 min (Unbilled):  PT instructed patient in postural strengthening exercise to improve glenohumeral joint positioning:  Standing with red tband in BUE:  -BUE shoulder extension x10 reps ; tactile and demonstrative cueing for body mechanics, occasional shoulder hike requiring cueing to decrease UE trap activation   -BUE scapular retraction, low rows x10 reps; requires hands on tactile cueing to scapula for retraction, adduction rather than elevation  Patient required min VCs for correct exercise technique including to increase scapular retraction and improve erect posture for better exercise technique and to reduce discomfort;   Patient seated: Hands on knees, slowly extend to drag hands up to hip/behind back for GH mobility and upright posture, x15 reps   Green Swiss ball on mat table, patient seated at chair in front of mat: forward flexion x2 min, lateral flexion to R x1 min, lateral flexion to L x1 min;  verbal cues for chin tuck. Require tactile cueing for body mechanics initially.    Patient supine; PT performed grade II-III inferior glenohumeral joint mobilizations 20 sec bouts x5 sets bilaterally;   PT performed grade II-III anterior/posterior glenohumeral joint mobilization 20 sec bouts x5 sets bilaterally;   Mobilization with movement, AP with distractive forces with cross body (horizontal adduction) 3 minutes    PT performed PROM to glenohumeral joint flexion, abduction, ER/IR, x7 reps each direction bilaterally;   Rhythmic oscillations between PROM for muscle relaxation.    Rhythmic oscillations with distraction to reduce pain 10x 5 second holds each UE.   Patient required min VCs to relax for better joint mobility; She denies any pain with manual therapy;                       PT Education - 01/28/18 1445    Education provided  Yes    Education Details  exercise technique, manual, posture    Person(s) Educated  Patient    Methods  Explanation;Demonstration;Verbal cues    Comprehension  Verbalized understanding;Returned demonstration;Need further instruction       PT Short Term Goals - 01/16/18 1710      PT SHORT TERM GOAL #1   Title  Patient will be able to achieve B active assisted shoulder flexion >140 degrees without pain in order to promote functional independence with ADLs.    Baseline  L 124, R 133    Time  2    Period  Weeks    Status  New    Target Date  01/30/18      PT SHORT TERM GOAL #2   Title  Patient will report a worst pain of 4/10 on NPRS in order to improve tolerance with ADLs and reduced symptoms with activities.     Baseline  7/10    Time  2    Period  Weeks    Status  New    Target Date  01/30/18        PT Long Term Goals - 01/16/18 1716      PT LONG TERM GOAL #1   Title  Patient will report a worst pain of 0/10 on NPRS in order to improve tolerance with ADLs and reduced symptoms with activities.     Baseline  7/10    Time  6    Period  Weeks    Status  New    Target Date  02/27/18      PT LONG TERM GOAL #2   Title  Patient will increase BUE gross strength to 4+/5 in order to improve functional strength and promote independence with all ADLs.    Baseline  3-/5    Time  6    Period  Weeks    Status  New    Target Date  02/27/18      PT LONG TERM GOAL #3   Title  Patient will be independent with all upper body hygiene and dressing pain-free in order to promote independence.    Baseline  Unable    Time  6    Period  Weeks    Status  New    Target Date  02/27/18      PT LONG TERM GOAL #4   Title  Patient will improve Quick DASH score from 54% (moderate disability) to at most 46% in order to improve functional ability in ADLs.    Baseline  54% (moderate disability)  Time   6    Period  Weeks    Status  New    Target Date  02/27/18            Plan - 01/28/18 1624    Clinical Impression Statement  Patient continues to demonstrate muscle guarding of BUE during manual therapy especially. Pt requires VCs for proper exercise form and sequencing as well as to reduce upper trap activation. Rhythmic oscillations and distraction appear to decrease muscle tension for improved motion in PROM. Pt reported pain decreased from 7/10 to 6/10 and does notice difference following manual therapy. Pt will continue to benefit from skilled PT intervention for improvements in pain, ROM, strength, and function to promote increased independence and overall QOL.     Rehab Potential  Good    Clinical Impairments Affecting Rehab Potential  pain, decreased ROM, strength deficits    PT Frequency  2x / week    PT Duration  6 weeks    PT Treatment/Interventions  Ultrasound;Moist Heat;Cryotherapy;Therapeutic activities;Therapeutic exercise;Neuromuscular re-education;Patient/family education;Manual techniques;Joint Manipulations;Dry needling;Taping    PT Next Visit Plan  ROM, manual interventions, and strengthening    PT Home Exercise Plan  pulleys in scaption and flexion    Consulted and Agree with Plan of Care  Patient;Family member/caregiver    Family Member Consulted  [name]       Patient will benefit from skilled therapeutic intervention in order to improve the following deficits and impairments:  Decreased range of motion, Decreased strength, Impaired UE functional use, Pain, Hypomobility, Impaired perceived functional ability  Visit Diagnosis: Chronic left shoulder pain  Chronic right shoulder pain  Stiffness of right shoulder, not elsewhere classified  Muscle weakness (generalized)  Stiffness of left shoulder, not elsewhere classified     Problem List There are no active problems to display for this patient.  Dossie Arbour, SPT This entire session was  performed under direct supervision and direction of a licensed therapist/therapist assistant . I have personally read, edited and approve of the note as written.  Trotter,Margaret PT, DPT 01/28/2018, 4:59 PM  [facility] MAIN [facility] SERVICES [address] [place], [place], [address] Phone: [phone]   Fax:  [phone]  Name: SIMYA TERCERO MRN: [id number] Date of Birth: 08-03-1950

## 2018-01-30 ENCOUNTER — Ambulatory Visit: Payer: Medicare Other | Admitting: Physical Therapy

## 2018-01-30 ENCOUNTER — Encounter: Payer: Self-pay | Admitting: Physical Therapy

## 2018-01-30 DIAGNOSIS — M25612 Stiffness of left shoulder, not elsewhere classified: Secondary | ICD-10-CM

## 2018-01-30 DIAGNOSIS — M25511 Pain in right shoulder: Secondary | ICD-10-CM

## 2018-01-30 DIAGNOSIS — M25512 Pain in left shoulder: Principal | ICD-10-CM

## 2018-01-30 DIAGNOSIS — G8929 Other chronic pain: Secondary | ICD-10-CM

## 2018-01-30 DIAGNOSIS — M6281 Muscle weakness (generalized): Secondary | ICD-10-CM

## 2018-01-30 DIAGNOSIS — M25611 Stiffness of right shoulder, not elsewhere classified: Secondary | ICD-10-CM

## 2018-01-30 NOTE — Therapy (Addendum)
[facility] MAIN [facility] SERVICES [address] [place], [place], [address] Phone: [phone]   Fax:  [phone]  Physical Therapy Treatment  Patient Details  Name: Haley Lucas MRN: [id number] Date of Birth: 01/31/1951 Referring Provider (PT): Rosemarie Ax   Encounter Date: 01/30/2018  PT End of Session - 01/30/18 1517    Visit Number  4    Number of Visits  13    Date for PT Re-Evaluation  02/27/18    Authorization Type  progress note 4/10    PT Start Time  1515    PT Stop Time  1600    PT Time Calculation (min)  45 min    Activity Tolerance  Patient tolerated treatment well;Patient limited by pain    Behavior During Therapy  [facility] for tasks assessed/performed       Past Medical History:  Diagnosis Date  . High cholesterol   . Hypertension   . Thyroid disease     History reviewed. No pertinent surgical history.  There were no vitals filed for this visit.  Subjective Assessment - 01/30/18 1523    Subjective  Patient reports she is doing good; continuing to have shoulder pain that is not really changing.     Pertinent History  L shoulder pain starting Feb 2019, R shoulder pain starting June 2019. No confirmed MOI, but family member reported pain and stiffness is 2/2 to recovery s/p acoustic neuroma.    Limitations  Lifting;House hold activities    How long can you sit comfortably?  NA    How long can you stand comfortably?  >30 minutes    How long can you walk comfortably?  >1047ft    Diagnostic tests  Xrays of B shoulder (9/27)- normal    Patient Stated Goals  reduce shoulder pain,     Currently in Pain?  Yes    Pain Score  7     Pain Location  Shoulder    Pain Orientation  Right;Left    Pain Descriptors / Indicators  Tightness;Shooting    Pain Type  Chronic pain    Pain Onset  More than a month ago    Pain Frequency  Constant    Aggravating Factors   reaching behind back, reaching overhead    Pain Relieving Factors  heat,  pain meds    Effect of Pain on Daily Activities  no significant changes             Treatment Warm up on UBE BUE forwards/backwards level 3 x 3 min (Unbilled):  PT instructed patient in postural strengthening exercise to improve glenohumeral joint positioning:  Standing with red tband in BUE:  -BUE shoulder extension x15 reps ; tactile and demonstrative cueing for body mechanics, occasional shoulder hike requiring cueing to decrease UE trap activation    -BUE scapular retraction, low rows x15 reps; requires hands on tactile cueing to scapula for retraction, adduction rather than elevation  Patient required min VCs for correct exercise technique including to increase scapular retraction and improve erect posture for better exercise technique and to reduce discomfort;  -BUE shoulder abduction x15 reps; VCs for proper technique and positioning for better strengthening and to reduce discomfort    Patient seated: Hands on knees, slowly extend to drag hands up to hip/behind back for GH mobility and upright posture, x15 reps   Green Swiss ball on mat table, patient seated at chair in front of mat: forward flexion x2 min, lateral flexion to  R x1 min, lateral flexion to L x1 min; verbal cues for chin tuck. Require tactile cueing for body mechanics initially.    Patient supine; PT performed grade II-III inferior glenohumeral joint mobilizations 20 sec bouts x5 sets bilaterally;   PT performed grade II-III anterior/posterior glenohumeral joint mobilization 20 sec bouts x5 sets bilaterally;   Mobilization with movement, AP with distractive forces with cross body (horizontal adduction) 3 minutes    PT performed PROM to glenohumeral joint flexion, abduction, ER/IR, x7 reps each direction bilaterally;   Rhythmic oscillations between PROM for muscle relaxation.    Rhythmic oscillations with distraction to reduce pain 10x 5 second holds each UE.    Patient required min VCs to relax for  better joint mobility; She denies any pain with manual therapy;                  PT Education - 01/30/18 1517    Education provided  Yes    Education Details  exercise technique, manual, posture    Person(s) Educated  Patient    Methods  Explanation;Demonstration;Verbal cues    Comprehension  Verbalized understanding;Returned demonstration;Need further instruction       PT Short Term Goals - 01/16/18 1710      PT SHORT TERM GOAL #1   Title  Patient will be able to achieve B active assisted shoulder flexion >140 degrees without pain in order to promote functional independence with ADLs.    Baseline  L 124, R 133    Time  2    Period  Weeks    Status  New    Target Date  01/30/18      PT SHORT TERM GOAL #2   Title  Patient will report a worst pain of 4/10 on NPRS in order to improve tolerance with ADLs and reduced symptoms with activities.     Baseline  7/10    Time  2    Period  Weeks    Status  New    Target Date  01/30/18        PT Long Term Goals - 01/16/18 1716      PT LONG TERM GOAL #1   Title  Patient will report a worst pain of 0/10 on NPRS in order to improve tolerance with ADLs and reduced symptoms with activities.     Baseline  7/10    Time  6    Period  Weeks    Status  New    Target Date  02/27/18      PT LONG TERM GOAL #2   Title  Patient will increase BUE gross strength to 4+/5 in order to improve functional strength and promote independence with all ADLs.    Baseline  3-/5    Time  6    Period  Weeks    Status  New    Target Date  02/27/18      PT LONG TERM GOAL #3   Title  Patient will be independent with all upper body hygiene and dressing pain-free in order to promote independence.    Baseline  Unable    Time  6    Period  Weeks    Status  New    Target Date  02/27/18      PT LONG TERM GOAL #4   Title  Patient will improve Quick DASH score from 54% (moderate disability) to at most 46% in order to improve functional ability in  ADLs.    Baseline  54% (moderate disability)    Time  6    Period  Weeks    Status  New    Target Date  02/27/18            Plan - 01/30/18 1600    Clinical Impression Statement  Patient continues to demonstrate significant muscle guarding during manual therapy. Gentle oscillations and distractions help to decrease muscle tension with VCs to relax shoulders. Pt continues to require VCs for proper exercise form and sequencing as well as to reduce shoulder shrugging and upper trap activation. Pt continues to report feeling improvement in shoulder following manual therapy but states relief does not last very long. Pt will continue to benefit from skilled PT intervention for improvements in shoulder pain, ROM, strength, and function.    Rehab Potential  Good    Clinical Impairments Affecting Rehab Potential  pain, decreased ROM, strength deficits    PT Frequency  2x / week    PT Duration  6 weeks    PT Treatment/Interventions  Ultrasound;Moist Heat;Cryotherapy;Therapeutic activities;Therapeutic exercise;Neuromuscular re-education;Patient/family education;Manual techniques;Joint Manipulations;Dry needling;Taping    PT Next Visit Plan  ROM, manual interventions, and strengthening    PT Home Exercise Plan  pulleys in scaption and flexion    Consulted and Agree with Plan of Care  Patient;Family member/caregiver    Family Member Consulted  [name]       Patient will benefit from skilled therapeutic intervention in order to improve the following deficits and impairments:  Decreased range of motion, Decreased strength, Impaired UE functional use, Pain, Hypomobility, Impaired perceived functional ability  Visit Diagnosis: Chronic left shoulder pain  Chronic right shoulder pain  Stiffness of right shoulder, not elsewhere classified  Stiffness of left shoulder, not elsewhere classified  Muscle weakness (generalized)     Problem List There are no active problems to display for  this patient.  Dossie Arbour, SPT This entire session was performed under direct supervision and direction of a licensed therapist/therapist assistant . I have personally read, edited and approve of the note as written.  Trotter,Margaret  PT, DPT 01/30/2018, 4:15 PM  [place] [facility] MAIN [facility] SERVICES [address] [place], [place], [address] Phone: [phone]   Fax:  [phone]  Name: Haley Lucas MRN: [id number] Date of Birth: 1950/09/27

## 2018-02-04 ENCOUNTER — Ambulatory Visit: Payer: Medicare Other

## 2018-02-04 DIAGNOSIS — M25512 Pain in left shoulder: Principal | ICD-10-CM

## 2018-02-04 DIAGNOSIS — M6281 Muscle weakness (generalized): Secondary | ICD-10-CM

## 2018-02-04 DIAGNOSIS — M25612 Stiffness of left shoulder, not elsewhere classified: Secondary | ICD-10-CM

## 2018-02-04 DIAGNOSIS — G8929 Other chronic pain: Secondary | ICD-10-CM

## 2018-02-04 DIAGNOSIS — M25611 Stiffness of right shoulder, not elsewhere classified: Secondary | ICD-10-CM

## 2018-02-04 DIAGNOSIS — M25511 Pain in right shoulder: Secondary | ICD-10-CM

## 2018-02-04 NOTE — Therapy (Addendum)
[place] [facility] MAIN [facility] SERVICES [address] [place], [place], [address] Phone: [phone]   Fax:  [phone]  Physical Therapy Treatment  Patient Details  Name: Haley Lucas MRN: [id number] Date of Birth: 23-Mar-1951 Referring Provider (PT): Rosemarie Ax   Encounter Date: 02/04/2018  PT End of Session - 02/04/18 1521    Visit Number  5    Number of Visits  13    Date for PT Re-Evaluation  02/27/18    Authorization Type  progress note 5/10    PT Start Time  1515    PT Stop Time  1600    PT Time Calculation (min)  45 min    Activity Tolerance  Patient tolerated treatment well;Patient limited by pain    Behavior During Therapy  [facility] for tasks assessed/performed       Past Medical History:  Diagnosis Date  . High cholesterol   . Hypertension   . Thyroid disease     History reviewed. No pertinent surgical history.  There were no vitals filed for this visit.  Subjective Assessment - 02/04/18 1518    Subjective  Patient reports "just a little bit" better; continuing to have pain in both shoulders.     Pertinent History  L shoulder pain starting Feb 2019, R shoulder pain starting June 2019. No confirmed MOI, but family member reported pain and stiffness is 2/2 to recovery s/p acoustic neuroma.    Limitations  Lifting;House hold activities    How long can you sit comfortably?  NA    How long can you stand comfortably?  >30 minutes    How long can you walk comfortably?  >1071ft    Diagnostic tests  Xrays of B shoulder (9/27)- normal    Patient Stated Goals  reduce shoulder pain,     Currently in Pain?  Yes    Pain Score  7     Pain Location  Shoulder    Pain Orientation  Right;Left    Pain Descriptors / Indicators  Shooting;Tightness    Pain Type  Chronic pain    Pain Onset  More than a month ago    Pain Frequency  Constant    Aggravating Factors   reaching behind back, reaching overhead    Pain Relieving Factors  heat, pain meds     Effect of Pain on Daily Activities  no significant changes    Multiple Pain Sites  No       Treatment Warm up on UBE BUEforwards/backwards level3x 3 min (Unbilled):  PT instructed patient in postural strengthening exercise to improve glenohumeral joint positioning:  Standing with red tband in BUE:  -BUE shoulder extension x15 reps; tactile and demonstrative cueing for body mechanics, occasional shoulder hike requiring cueing to decrease UE trap activation  -BUE scapular retraction, low rows x15 reps;requires hands on tactile cueing to scapula for retraction, adduction rather than elevation  Patient required min VCs for correct exercise technique including to increase scapular retraction and improve erect posture for better exercise technique and to reduce discomfort;  -BUE shoulder abduction x15 reps; VCs for proper technique and positioning for better strengthening and to reduce discomfort   Posterior shoulder rolls x10 reps; VCs required for proper technique as well as demonstration   Patient seated: Hands on knees, slowly extend to drag hands up to hip/behind back for GH mobility and upright posture, x15 reps  Green Swiss ball on mat table, patient seated at chair in front of mat:  forward flexion x2 min, lateral flexion to Rx1 min, lateral flexion to L x19min; verbal cues for chin tuck. Require tactile cueing for body mechanics initially.  Patient supine; PT performed grade II-III inferior glenohumeral joint mobilizations 20 sec bouts x5 sets bilaterally;  PT performed grade II-III anterior/posterior glenohumeral joint mobilization 20 sec bouts x5sets bilaterally;  PT performed PROM to glenohumeral joint flexion, abduction, ER/IR, x7reps each direction bilaterally;  Rhythmicoscillationsbetween PROM for muscle relaxation.   Rhythmicoscillationswith distraction to reduce pain 10x 5 second holds each UE.  Patient required min VCs to relax for better  joint mobility; She denies any pain with manual therapy;reports feeling a little better following manual therapy and displays improved AROM in reaching overhead and putting on jacket following therapy                        PT Education - 02/04/18 1521    Education provided  Yes    Education Details  exercise technique, manual, posture    Person(s) Educated  Patient    Methods  Explanation;Demonstration;Verbal cues    Comprehension  Verbalized understanding;Returned demonstration;Verbal cues required;Need further instruction       PT Short Term Goals - 01/16/18 1710      PT SHORT TERM GOAL #1   Title  Patient will be able to achieve B active assisted shoulder flexion >140 degrees without pain in order to promote functional independence with ADLs.    Baseline  L 124, R 133    Time  2    Period  Weeks    Status  New    Target Date  01/30/18      PT SHORT TERM GOAL #2   Title  Patient will report a worst pain of 4/10 on NPRS in order to improve tolerance with ADLs and reduced symptoms with activities.     Baseline  7/10    Time  2    Period  Weeks    Status  New    Target Date  01/30/18        PT Long Term Goals - 01/16/18 1716      PT LONG TERM GOAL #1   Title  Patient will report a worst pain of 0/10 on NPRS in order to improve tolerance with ADLs and reduced symptoms with activities.     Baseline  7/10    Time  6    Period  Weeks    Status  New    Target Date  02/27/18      PT LONG TERM GOAL #2   Title  Patient will increase BUE gross strength to 4+/5 in order to improve functional strength and promote independence with all ADLs.    Baseline  3-/5    Time  6    Period  Weeks    Status  New    Target Date  02/27/18      PT LONG TERM GOAL #3   Title  Patient will be independent with all upper body hygiene and dressing pain-free in order to promote independence.    Baseline  Unable    Time  6    Period  Weeks    Status  New    Target Date   02/27/18      PT LONG TERM GOAL #4   Title  Patient will improve Quick DASH score from 54% (moderate disability) to at most 46% in order to improve functional ability in ADLs.  Baseline  54% (moderate disability)    Time  6    Period  Weeks    Status  New    Target Date  02/27/18            Plan - 02/04/18 1536    Clinical Impression Statement  Patient tolerated therapy session well. Pt continues to demonstrate significant muscle guarding during manual therapy. Muscle tension decreases with gentle oscillations and distractions as well as VCs to relax shoulders. Pt continues to require VCs for proper exercise technique and positioning as well as to reduce shoulder shrugging. Pt continues to report feeling some improvement following manual therapy; reports some improvement at beginning of session today. Pt will continue to benefit from skilled PT intervention for improvements in shoulder pain, ROM, strength, and function.     Rehab Potential  Good    Clinical Impairments Affecting Rehab Potential  pain, decreased ROM, strength deficits    PT Frequency  2x / week    PT Duration  6 weeks    PT Treatment/Interventions  Ultrasound;Moist Heat;Cryotherapy;Therapeutic activities;Therapeutic exercise;Neuromuscular re-education;Patient/family education;Manual techniques;Joint Manipulations;Dry needling;Taping    PT Next Visit Plan  ROM, manual interventions, and strengthening    PT Home Exercise Plan  pulleys in scaption and flexion    Consulted and Agree with Plan of Care  Patient;Family member/caregiver    Family Member Consulted  [name]       Patient will benefit from skilled therapeutic intervention in order to improve the following deficits and impairments:  Decreased range of motion, Decreased strength, Impaired UE functional use, Pain, Hypomobility, Impaired perceived functional ability  Visit Diagnosis: Chronic left shoulder pain  Chronic right shoulder pain  Stiffness  of right shoulder, not elsewhere classified  Stiffness of left shoulder, not elsewhere classified  Muscle weakness (generalized)     Problem List There are no active problems to display for this patient.  This entire session was performed under direct supervision and direction of a licensed therapist/therapist assistant . I have personally read, edited and approve of the note as written.    Dossie Arbour, SPT Lynnea Maizes PT, DPT, GCS  Huprich,Jason 02/05/2018, 5:34 PM  [place] [facility] MAIN [facility] SERVICES [address] [place], [place], [address] Phone: [phone]   Fax:  [phone]  Name: Haley Lucas MRN: [id number] Date of Birth: 1951/01/21

## 2018-02-06 ENCOUNTER — Ambulatory Visit: Payer: Medicare Other

## 2018-02-11 ENCOUNTER — Ambulatory Visit: Payer: Medicare Other

## 2018-02-11 DIAGNOSIS — M25612 Stiffness of left shoulder, not elsewhere classified: Secondary | ICD-10-CM

## 2018-02-11 DIAGNOSIS — G8929 Other chronic pain: Secondary | ICD-10-CM

## 2018-02-11 DIAGNOSIS — M6281 Muscle weakness (generalized): Secondary | ICD-10-CM | POA: Diagnosis not present

## 2018-02-11 DIAGNOSIS — M25511 Pain in right shoulder: Secondary | ICD-10-CM

## 2018-02-11 DIAGNOSIS — M25512 Pain in left shoulder: Principal | ICD-10-CM

## 2018-02-11 DIAGNOSIS — M25611 Stiffness of right shoulder, not elsewhere classified: Secondary | ICD-10-CM

## 2018-02-11 NOTE — Patient Instructions (Signed)
Access Code: [email or web address]  URL: [email or web address]  Date: 02/11/2018  Prepared by: Zettie Pho   Exercises  Single Arm Doorway Pec Stretch at 90 Degrees Abduction - 10 reps - 1 sets - 15 hold - 2x daily - 7x weekly

## 2018-02-11 NOTE — Therapy (Addendum)
[facility] MAIN [facility] SERVICES [address] [place], [place], [address] Phone: [phone]   Fax:  [phone]  Physical Therapy Treatment  Patient Details  Name: Haley Lucas MRN: [id number] Date of Birth: December 25, 1950 Referring Provider (PT): Rosemarie Ax   Encounter Date: 02/11/2018  PT End of Session - 02/11/18 1509    Visit Number  6    Number of Visits  13    Date for PT Re-Evaluation  02/27/18    Authorization Type  progress note 6/10    PT Start Time  1515    PT Stop Time  1600    PT Time Calculation (min)  45 min    Activity Tolerance  Patient tolerated treatment well;Patient limited by pain    Behavior During Therapy  [facility] for tasks assessed/performed       Past Medical History:  Diagnosis Date  . High cholesterol   . Hypertension   . Thyroid disease     History reviewed. No pertinent surgical history.  There were no vitals filed for this visit.  Subjective Assessment - 02/11/18 1524    Subjective  Patient reports R shoulder is doing "okay" but L shoulder feels "bumpy" following an injection. When asked if injection was steroid for pain, pt reports "no- it was something else." She states she has been continuing HEP.     Pertinent History  L shoulder pain starting Feb 2019, R shoulder pain starting June 2019. No confirmed MOI, but family member reported pain and stiffness is 2/2 to recovery s/p acoustic neuroma.    Limitations  Lifting;House hold activities    How long can you sit comfortably?  NA    How long can you stand comfortably?  >30 minutes    How long can you walk comfortably?  >1080ft    Diagnostic tests  Xrays of B shoulder (9/27)- normal    Patient Stated Goals  reduce shoulder pain,     Currently in Pain?  Yes    Pain Score  7     Pain Location  Shoulder    Pain Orientation  Right;Left    Pain Descriptors / Indicators  Tightness;Shooting    Pain Type  Chronic pain    Pain Onset  More than a month ago     Pain Frequency  Constant    Aggravating Factors   reaching behind back, reaching overhead    Pain Relieving Factors  heat, pain meds    Effect of Pain on Daily Activities  no significant changes    Multiple Pain Sites  No         Treatment Warm up on UBE BUEforwards/backwards level3x 3 min (Unbilled):  Green Swiss ball on mat table, patient seated at chair in front of mat: forward flexion x2 min, lateral flexion to Rx1 min, lateral flexion to L x1min; verbal cues for chin tuck.  Posterior shoulder rolls x10 reps; VCs required for proper technique as well as demonstration   Patient seated: Hands on knees, slowly extend to drag hands up to hip/behind back for GH mobility and upright posture, x15 reps  Patient supine; PT performed grade II-III inferior glenohumeral joint mobilizations 20 sec bouts x5 sets R shoulder only;  PT performed grade II-III anterior/posterior glenohumeral joint mobilization 20 sec bouts x5sets R shoulder only;  PT performed D1 pattern x15 reps on R shoulder only;   Rhythmicoscillationswith distraction to reduce pain 10x 5 second holds R shoulder only  UE ranger on wall:  -  Flexion x15 reps -ABD x15 reps -ADD x15 reps  Performed on each arm with tactile cuing at scapula for better ROM and control   Patient required VCs to relax for better joint mobility; She denies any pain with manual therapy;reports feeling a little better following manual therapy and displays improved AROM in reaching overhead and reaching behind back  Advanced HEP with including single UE pec stretch in doorway x20 sec holds on each arm; provided handout for better adherence                    PT Education - 02/11/18 1508    Education provided  Yes    Education Details  exercise technique, manual, posture    Person(s) Educated  Patient    Methods  Explanation;Demonstration;Verbal cues    Comprehension  Verbalized understanding;Returned  demonstration;Verbal cues required;Need further instruction       PT Short Term Goals - 01/16/18 1710      PT SHORT TERM GOAL #1   Title  Patient will be able to achieve B active assisted shoulder flexion >140 degrees without pain in order to promote functional independence with ADLs.    Baseline  L 124, R 133    Time  2    Period  Weeks    Status  New    Target Date  01/30/18      PT SHORT TERM GOAL #2   Title  Patient will report a worst pain of 4/10 on NPRS in order to improve tolerance with ADLs and reduced symptoms with activities.     Baseline  7/10    Time  2    Period  Weeks    Status  New    Target Date  01/30/18        PT Long Term Goals - 01/16/18 1716      PT LONG TERM GOAL #1   Title  Patient will report a worst pain of 0/10 on NPRS in order to improve tolerance with ADLs and reduced symptoms with activities.     Baseline  7/10    Time  6    Period  Weeks    Status  New    Target Date  02/27/18      PT LONG TERM GOAL #2   Title  Patient will increase BUE gross strength to 4+/5 in order to improve functional strength and promote independence with all ADLs.    Baseline  3-/5    Time  6    Period  Weeks    Status  New    Target Date  02/27/18      PT LONG TERM GOAL #3   Title  Patient will be independent with all upper body hygiene and dressing pain-free in order to promote independence.    Baseline  Unable    Time  6    Period  Weeks    Status  New    Target Date  02/27/18      PT LONG TERM GOAL #4   Title  Patient will improve Quick DASH score from 54% (moderate disability) to at most 46% in order to improve functional ability in ADLs.    Baseline  54% (moderate disability)    Time  6    Period  Weeks    Status  New    Target Date  02/27/18            Plan - 02/11/18 1601    Clinical Impression  Statement  Patient tolerated therapy session well. Pt responds well to manual therapy and reports slightly decreased pain; demonstrates improved  AROM with reaching overhead and behind back. Pt demonstrates significant muscle guarding with PROM; gentle oscillations and distractions help to decrease guarding. Pt requires max VCs to relax shoulder sand allow therapist to move pt through range. Pt unable to perform scapular and postural strengthening exercises today due to increased pain in L shoulder 2/2 injection. Pt declined manual therapy to L shoulder today as well 2/2 injection. Pt amenable to UE ranger in flex, ABD, and ADD with BUE; required tactile cuing at scapula for better ROM and control as well as VCs for proper technique. Pt will continue to benefit from skilled PT intervention for improvements in shoulder pain, ROM, strength, and function.    Rehab Potential  Good    Clinical Impairments Affecting Rehab Potential  pain, decreased ROM, strength deficits    PT Frequency  2x / week    PT Duration  6 weeks    PT Treatment/Interventions  Ultrasound;Moist Heat;Cryotherapy;Therapeutic activities;Therapeutic exercise;Neuromuscular re-education;Patient/family education;Manual techniques;Joint Manipulations;Dry needling;Taping    PT Next Visit Plan  ROM, manual interventions, and strengthening    PT Home Exercise Plan  pulleys in scaption and flexion    Consulted and Agree with Plan of Care  Patient;Family member/caregiver    Family Member Consulted  [name]       Patient will benefit from skilled therapeutic intervention in order to improve the following deficits and impairments:  Decreased range of motion, Decreased strength, Impaired UE functional use, Pain, Hypomobility, Impaired perceived functional ability  Visit Diagnosis: Chronic left shoulder pain  Chronic right shoulder pain  Stiffness of right shoulder, not elsewhere classified  Stiffness of left shoulder, not elsewhere classified  Muscle weakness (generalized)     Problem List There are no active problems to display for this patient.   This entire  session was performed under direct supervision and direction of a licensed therapist/therapist assistant . I have personally read, edited and approve of the note as written.    Dossie Arbour, SPT Lynnea Maizes PT, DPT, GCS  Huprich,Jason 02/11/2018, 8:48 PM  [facility] MAIN [facility] SERVICES [address] [place], [place], [address] Phone: [phone]   Fax:  [phone]  Name: Haley Lucas MRN: [id number] Date of Birth: [birth year]

## 2018-02-13 ENCOUNTER — Ambulatory Visit: Payer: Medicare Other

## 2018-02-13 DIAGNOSIS — M6281 Muscle weakness (generalized): Secondary | ICD-10-CM | POA: Diagnosis not present

## 2018-02-13 DIAGNOSIS — M25511 Pain in right shoulder: Secondary | ICD-10-CM

## 2018-02-13 DIAGNOSIS — G8929 Other chronic pain: Secondary | ICD-10-CM

## 2018-02-13 DIAGNOSIS — M25611 Stiffness of right shoulder, not elsewhere classified: Secondary | ICD-10-CM

## 2018-02-13 DIAGNOSIS — M25512 Pain in left shoulder: Principal | ICD-10-CM

## 2018-02-13 DIAGNOSIS — M25612 Stiffness of left shoulder, not elsewhere classified: Secondary | ICD-10-CM

## 2018-02-13 NOTE — Therapy (Signed)
[facility] MAIN [facility] SERVICES [address] [place], [place], [address] Phone: [phone]   Fax:  [phone]  Physical Therapy Treatment  Patient Details  Name: Haley Lucas MRN: [id number] Date of Birth: 12-09-1950 Referring Provider (PT): Rosemarie Ax   Encounter Date: 02/13/2018  PT End of Session - 02/13/18 1322    Visit Number  7    Number of Visits  13    Date for PT Re-Evaluation  02/27/18    Authorization Type  progress note 7/10    PT Start Time  1301    PT Stop Time  1345    PT Time Calculation (min)  44 min    Activity Tolerance  Patient tolerated treatment well    Behavior During Therapy  [facility] for tasks assessed/performed       Past Medical History:  Diagnosis Date  . High cholesterol   . Hypertension   . Thyroid disease     History reviewed. No pertinent surgical history.  There were no vitals filed for this visit.  Subjective Assessment - 02/13/18 1259    Subjective  Pt reports that her shoulders are "a little" better today. She denies any shoulder pain at rest. No specific questions or concerns currently.     Pertinent History  L shoulder pain starting Feb 2019, R shoulder pain starting June 2019. No confirmed MOI, but family member reported pain and stiffness is 2/2 to recovery s/p acoustic neuroma.    Limitations  Lifting;House hold activities    How long can you sit comfortably?  NA    How long can you stand comfortably?  >30 minutes    How long can you walk comfortably?  >1067ft    Diagnostic tests  Xrays of B shoulder (9/27)- normal    Patient Stated Goals  reduce shoulder pain,     Currently in Pain?  No/denies    Pain Onset  --           TREATMENT  Ther-ex  Warm up on UBE BUEforwards/backwards level3x 3 min (Unbilled): UE ranger on wall:  Flexion x15 reps ABD x15 reps ADD x15 reps  Supine canes for ER x 15 each with 3s hold at end range; Seated shoulder rolls x 10 bilateral; Silver  pball on floor rollouts forward flexion x 15, R lateral flexion x 15, L lateral flexion x 15;   Manual Therapy  Grade I A/P GH mobilizations at neutral, 30s/bout x 3 bouts bilateral; Grade I inferior GH mobilizations at 90 abduction 30s/bout x 3 bouts bilateral; Rhythmicoscillationswith distraction to reduce pain 10x 5 second holds R shoulder only Gentle STM to R upper trap x 3 minutes;   Pt educated throughout session about proper posture and technique with exercises. Improved exercise technique, movement at target joints, use of target muscles after min to mod verbal, visual, tactile cues.    Patient continues to demonstrate good motivation today. She reports less resting pain than during prior sessions. Pain at end range flexion, scaption, and ER. She continues to require VCs for proper exercise technique and positioning. Pt continues to report feeling some improvement following manual therapy. Pt will continue to benefit from skilled PT intervention for improvements in shoulder pain, ROM, strength, and function.                        PT Short Term Goals - 01/16/18 1710      PT SHORT TERM GOAL #1  Title  Patient will be able to achieve B active assisted shoulder flexion >140 degrees without pain in order to promote functional independence with ADLs.    Baseline  L 124, R 133    Time  2    Period  Weeks    Status  New    Target Date  01/30/18      PT SHORT TERM GOAL #2   Title  Patient will report a worst pain of 4/10 on NPRS in order to improve tolerance with ADLs and reduced symptoms with activities.     Baseline  7/10    Time  2    Period  Weeks    Status  New    Target Date  01/30/18        PT Long Term Goals - 01/16/18 1716      PT LONG TERM GOAL #1   Title  Patient will report a worst pain of 0/10 on NPRS in order to improve tolerance with ADLs and reduced symptoms with activities.     Baseline  7/10    Time  6    Period  Weeks    Status   New    Target Date  02/27/18      PT LONG TERM GOAL #2   Title  Patient will increase BUE gross strength to 4+/5 in order to improve functional strength and promote independence with all ADLs.    Baseline  3-/5    Time  6    Period  Weeks    Status  New    Target Date  02/27/18      PT LONG TERM GOAL #3   Title  Patient will be independent with all upper body hygiene and dressing pain-free in order to promote independence.    Baseline  Unable    Time  6    Period  Weeks    Status  New    Target Date  02/27/18      PT LONG TERM GOAL #4   Title  Patient will improve Quick DASH score from 54% (moderate disability) to at most 46% in order to improve functional ability in ADLs.    Baseline  54% (moderate disability)    Time  6    Period  Weeks    Status  New    Target Date  02/27/18            Plan - 02/13/18 1322    Clinical Impression Statement  Patient continues to demonstrate good motivation today. She reports less resting pain than during prior sessions. Pain at end range flexion, scaption, and ER. She continues to require VCs for proper exercise technique and positioning. Pt continues to report feeling some improvement following manual therapy. Pt will continue to benefit from skilled PT intervention for improvements in shoulder pain, ROM, strength, and function.     Rehab Potential  Good    Clinical Impairments Affecting Rehab Potential  pain, decreased ROM, strength deficits    PT Frequency  2x / week    PT Duration  6 weeks    PT Treatment/Interventions  Ultrasound;Moist Heat;Cryotherapy;Therapeutic activities;Therapeutic exercise;Neuromuscular re-education;Patient/family education;Manual techniques;Joint Manipulations;Dry needling;Taping    PT Next Visit Plan  ROM, manual interventions, and strengthening    PT Home Exercise Plan  pulleys in scaption and flexion    Consulted and Agree with Plan of Care  Patient;Family member/caregiver    Family Member Consulted   Daughter Haley Lucas)  Patient will benefit from skilled therapeutic intervention in order to improve the following deficits and impairments:  Decreased range of motion, Decreased strength, Impaired UE functional use, Pain, Hypomobility, Impaired perceived functional ability  Visit Diagnosis: Chronic left shoulder pain  Chronic right shoulder pain  Stiffness of right shoulder, not elsewhere classified  Stiffness of left shoulder, not elsewhere classified  Muscle weakness (generalized)     Problem List There are no active problems to display for this patient.  Lynnea Maizes PT, DPT, GCS  Huprich,Jason 02/13/2018, 4:38 PM  [place] [facility] MAIN [facility] SERVICES [address] [place], [place], [address] Phone: [phone]   Fax:  [phone]  Name: Haley Lucas MRN: [id number] Date of Birth: 07/20/1950

## 2018-02-17 ENCOUNTER — Ambulatory Visit: Payer: Medicare Other | Admitting: Physical Therapy

## 2018-02-19 ENCOUNTER — Ambulatory Visit: Payer: Medicare Other | Admitting: Physical Therapy

## 2018-02-26 ENCOUNTER — Ambulatory Visit: Payer: Medicare Other | Attending: *Deleted

## 2018-02-26 ENCOUNTER — Ambulatory Visit: Payer: Medicare Other | Admitting: Physical Therapy

## 2018-02-26 DIAGNOSIS — G8929 Other chronic pain: Secondary | ICD-10-CM | POA: Diagnosis present

## 2018-02-26 DIAGNOSIS — M25511 Pain in right shoulder: Secondary | ICD-10-CM | POA: Insufficient documentation

## 2018-02-26 DIAGNOSIS — M6281 Muscle weakness (generalized): Secondary | ICD-10-CM | POA: Diagnosis present

## 2018-02-26 DIAGNOSIS — M25612 Stiffness of left shoulder, not elsewhere classified: Secondary | ICD-10-CM

## 2018-02-26 DIAGNOSIS — M25611 Stiffness of right shoulder, not elsewhere classified: Secondary | ICD-10-CM | POA: Diagnosis present

## 2018-02-26 DIAGNOSIS — M25512 Pain in left shoulder: Secondary | ICD-10-CM | POA: Diagnosis present

## 2018-02-26 NOTE — Therapy (Signed)
[place] MAIN [facility] SERVICES [address] [place], [place], [address] Phone: [phone]   Fax:  [phone]  Physical Therapy Treatment  Patient Details  Name: Haley Lucas MRN: [id number] Date of Birth: 1950-11-04 Referring Provider (PT): Neomia Dear   Encounter Date: 02/26/2018  PT End of Session - 02/26/18 1403    Visit Number  8    Number of Visits  25    Date for PT Re-Evaluation  04/09/18    Authorization Type  progress note 8/10, next visit is 1/10    PT Start Time  1348    PT Stop Time  1430    PT Time Calculation (min)  42 min    Activity Tolerance  Patient tolerated treatment well    Behavior During Therapy  [facility] for tasks assessed/performed       Past Medical History:  Diagnosis Date  . High cholesterol   . Hypertension   . Thyroid disease     History reviewed. No pertinent surgical history.  There were no vitals filed for this visit.  Subjective Assessment - 02/26/18 1611    Subjective  Pt states that she is doing alright today but continues to have significant R sided facial pain and numbness since her surgery. She reports continued soreness in her shoulders. No specific questions or concerns at this time.     Pertinent History  L shoulder pain starting Feb 2019, R shoulder pain starting June 2019. No confirmed MOI, but family member reported pain and stiffness is 2/2 to recovery s/p acoustic neuroma.    Limitations  Lifting;House hold activities    How long can you sit comfortably?  NA    How long can you stand comfortably?  >30 minutes    How long can you walk comfortably?  >1087f    Diagnostic tests  Xrays of B shoulder (9/27)- normal    Patient Stated Goals  reduce shoulder pain,     Currently in Pain?  No/denies            TREATMENT  Ther-ex  Extensive conversation with interpreter Anabelle via [employer](ID: [phone] to clarify patient's complaints of R facial numbness. Warm up on  UBE BUEforwards/backwards levelx 3 min (Unbilled): Supine canes for flexion x 15 with 3s hold at end range; Supine shoulder rolls x 10 bilateral; Silver pball on floor rollouts forward flexion x 10, R lateral flexion x 10, L lateral flexion x 10; Pt completed QuickDASH with assist by therapist (Lysle Morales; MMT performed for bilateral shoulders; Goals updated and plan of care discussed with patient;   Manual Therapy  Grade II-III A/P GH mobilizations at avaialbe end range flexion, 30s/bout x 2 bouts bilateral; Grade II-III inferior GH mobilizations at 120 abduction 30s/bout x 2 bouts bilateral; Rhythmicoscillationswith distraction to reduce pain 5 x 5 second bilateral;   Pt educated throughout session about proper posture and technique with exercises. Improved exercise technique, movement at target joints, use of target muscles after min to mod verbal, visual, tactile cues.    Patient continues to demonstrate good motivation during session today. She has very mild increase in worst pain but not really significant. In addition her Quick DASH score is essentially unchanged from initial evaluation but it is challenging for patient to respond due to the language barrier. Her AAROM in both shoulders has improved significantly since the initial evaluation and her strength has also improved. Pt will continue to benefit from skilled PT intervention for improvements in  shoulder pain, ROM, strength, and function.                      PT Short Term Goals - 02/26/18 1403      PT SHORT TERM GOAL #1   Title  Patient will be able to achieve B active assisted shoulder flexion >140 degrees without pain in order to promote functional independence with ADLs.    Baseline  L 124, R 133; 02/26/18: L , R: 160, L: 150     Time  2    Period  Weeks    Status  Achieved    Target Date  03/19/18      PT SHORT TERM GOAL #2   Title  Patient will report a worst pain of 4/10 on NPRS in order  to improve tolerance with ADLs and reduced symptoms with activities.     Baseline  7/10; 02/26/18: worst pain: 7/10 R shoulder, 6/10 L shoulder    Time  2    Period  Weeks    Status  Partially Met    Target Date  03/19/18        PT Long Term Goals - 02/26/18 1404      PT LONG TERM GOAL #1   Title  Patient will report a worst pain of 0/10 on NPRS in order to improve tolerance with ADLs and reduced symptoms with activities.     Baseline  7/10; 02/26/18: worst pain: 7/10 R shoulder, 6/10 L shoulder    Time  6    Period  Weeks    Status  Partially Met      PT LONG TERM GOAL #2   Title  Patient will increase BUE gross strength to 4+/5 in order to improve functional strength and promote independence with all ADLs.    Baseline  3-/5; 02/26/18: MMT R/L: Shoulder flexion 4/4, abduction: 4/4-, ER (in sitting): 4/4, IR (in sitting): 4/4    Time  6    Period  Weeks    Status  Partially Met    Target Date  04/09/18      PT LONG TERM GOAL #3   Title  Patient will be independent with all upper body hygiene and dressing pain-free in order to promote independence.    Baseline  Unable; 02/26/18: Pt is independent    Time  6    Period  Weeks    Status  Achieved      PT LONG TERM GOAL #4   Title  Patient will improve Quick DASH score from 54% (moderate disability) to at most 46% in order to improve functional ability in ADLs.    Baseline  54% (moderate disability); 02/26/18: 56.82%    Time  6    Period  Weeks    Status  On-going    Target Date  04/09/18            Plan - 02/26/18 1403    Clinical Impression Statement  Patient continues to demonstrate good motivation during session today. She has very mild improvement in her worst shoulder pain but not significant. In addition her Quick DASH score is essentially unchanged from initial evaluation. However it is challenging for patient to answer the questionnaire due to the language barrier. Her AAROM in both shoulders has improved  significantly since the initial evaluation and her strength has also improved. Pt will continue to benefit from skilled PT intervention for improvements in shoulder pain, ROM, strength, and function.  Rehab Potential  Good    Clinical Impairments Affecting Rehab Potential  pain, decreased ROM, strength deficits    PT Frequency  2x / week    PT Duration  6 weeks    PT Treatment/Interventions  Ultrasound;Moist Heat;Cryotherapy;Therapeutic activities;Therapeutic exercise;Neuromuscular re-education;Patient/family education;Manual techniques;Joint Manipulations;Dry needling;Taping    PT Next Visit Plan  ROM, manual interventions, and strengthening    PT Home Exercise Plan  pulleys in scaption and flexion    Consulted and Agree with Plan of Care  Patient;Family member/caregiver    Family Member Consulted  Daughter [name])       Patient will benefit from skilled therapeutic intervention in order to improve the following deficits and impairments:  Decreased range of motion, Decreased strength, Impaired UE functional use, Pain, Hypomobility, Impaired perceived functional ability  Visit Diagnosis: Chronic left shoulder pain - Plan: PT plan of care cert/re-cert  Chronic right shoulder pain - Plan: PT plan of care cert/re-cert  Stiffness of right shoulder, not elsewhere classified - Plan: PT plan of care cert/re-cert  Stiffness of left shoulder, not elsewhere classified - Plan: PT plan of care cert/re-cert  Muscle weakness (generalized) - Plan: PT plan of care cert/re-cert     Problem List There are no active problems to display for this patient.  Phillips Grout PT, DPT, GCS  Huprich,Jason 02/26/2018, 4:24 PM  [place] MAIN [facility] SERVICES [address] [place], [place], [address] Phone: [phone]   Fax:  [phone]  Name: Haley Lucas MRN: [id number] Date of Birth: August 03, 1950

## 2018-03-03 ENCOUNTER — Ambulatory Visit: Payer: Medicare Other | Admitting: Physical Therapy

## 2018-03-05 ENCOUNTER — Encounter: Payer: Medicare Other | Admitting: Physical Therapy

## 2018-03-06 ENCOUNTER — Ambulatory Visit: Payer: Medicare Other

## 2018-03-06 DIAGNOSIS — M6281 Muscle weakness (generalized): Secondary | ICD-10-CM

## 2018-03-06 DIAGNOSIS — M25511 Pain in right shoulder: Secondary | ICD-10-CM

## 2018-03-06 DIAGNOSIS — M25611 Stiffness of right shoulder, not elsewhere classified: Secondary | ICD-10-CM

## 2018-03-06 DIAGNOSIS — M25512 Pain in left shoulder: Secondary | ICD-10-CM | POA: Diagnosis not present

## 2018-03-06 DIAGNOSIS — M25612 Stiffness of left shoulder, not elsewhere classified: Secondary | ICD-10-CM

## 2018-03-06 DIAGNOSIS — G8929 Other chronic pain: Secondary | ICD-10-CM

## 2018-03-06 NOTE — Therapy (Signed)
[place] MAIN [facility] SERVICES [address] [place], [place], [address] Phone: [phone]   Fax:  [phone]  Physical Therapy Treatment  Patient Details  Name: Haley Lucas MRN: [id number] Date of Birth: 04-28-1950 Referring Provider (PT): Neomia Dear   Encounter Date: 03/06/2018  PT End of Session - 03/06/18 1438    Visit Number  9    Number of Visits  25    Date for PT Re-Evaluation  04/09/18    Authorization Type   1/10    PT Start Time  1432    PT Stop Time  1515    PT Time Calculation (min)  43 min    Activity Tolerance  Patient tolerated treatment well    Behavior During Therapy  [facility] for tasks assessed/performed       Past Medical History:  Diagnosis Date  . High cholesterol   . Hypertension   . Thyroid disease     History reviewed. No pertinent surgical history.  There were no vitals filed for this visit.  Subjective Assessment - 03/06/18 1435    Subjective  Patient reports she has pain extending her shoulders back bilaterally. At rest has no pain, but when extends has 7/10 pain.     Pertinent History  L shoulder pain starting Feb 2019, R shoulder pain starting June 2019. No confirmed MOI, but family member reported pain and stiffness is 2/2 to recovery s/p acoustic neuroma.    Limitations  Lifting;House hold activities    How long can you sit comfortably?  NA    How long can you stand comfortably?  >30 minutes    How long can you walk comfortably?  >1027f    Diagnostic tests  Xrays of B shoulder (9/27)- normal    Patient Stated Goals  reduce shoulder pain,     Currently in Pain?  Yes    Pain Score  7     Pain Location  Shoulder    Pain Orientation  Right;Left    Pain Descriptors / Indicators  Aching;Shooting    Pain Type  Chronic pain    Pain Onset  More than a month ago    Pain Frequency  Constant       Ube 2 minutes forward, 2 minutes backwards Level 4  Supine:  ULTT Median nerve glides : 10x each  UE Robber stretch 3x30 seconds ER GTB 10x each  AAROM with PVC pipe flexion 10x 3 second holds R extends back further than L  PT performed grade II-III anterior/posterior, and inferior, glenohumeral joint mobilization 20 sec bouts x5sets R shoulder only; STM R pectoral musculature 2 minutes  Rhythmic perturbations 60 seconds each UE  Abduction PROM 20-30 second holds; 10x R and L UE    Patient seated: Hands on knees, slowly extend to drag hands up to hip/behind back for GH mobility and upright posture, x15 reps   Silver pball on floor rollouts forward flexion x 10, R lateral flexion x 10, L lateral flexion x 10 Seated abduction AROM: elbow bent 10x each UE                 PT Education - 03/07/18 0759    Education provided  Yes    Education Details  exercise technique, manual, posture     Person(s) Educated  Patient    Methods  Explanation;Demonstration;Tactile cues    Comprehension  Verbalized understanding;Returned demonstration;Tactile cues required;Need further instruction       PT Short  Term Goals - 02/26/18 1403      PT SHORT TERM GOAL #1   Title  Patient will be able to achieve B active assisted shoulder flexion >140 degrees without pain in order to promote functional independence with ADLs.    Baseline  L 124, R 133; 02/26/18: L , R: 160, L: 150     Time  2    Period  Weeks    Status  Achieved    Target Date  03/19/18      PT SHORT TERM GOAL #2   Title  Patient will report a worst pain of 4/10 on NPRS in order to improve tolerance with ADLs and reduced symptoms with activities.     Baseline  7/10; 02/26/18: worst pain: 7/10 R shoulder, 6/10 L shoulder    Time  2    Period  Weeks    Status  Partially Met    Target Date  03/19/18        PT Long Term Goals - 02/26/18 1404      PT LONG TERM GOAL #1   Title  Patient will report a worst pain of 0/10 on NPRS in order to improve tolerance with ADLs and reduced symptoms with activities.     Baseline   7/10; 02/26/18: worst pain: 7/10 R shoulder, 6/10 L shoulder    Time  6    Period  Weeks    Status  Partially Met      PT LONG TERM GOAL #2   Title  Patient will increase BUE gross strength to 4+/5 in order to improve functional strength and promote independence with all ADLs.    Baseline  3-/5; 02/26/18: MMT R/L: Shoulder flexion 4/4, abduction: 4/4-, ER (in sitting): 4/4, IR (in sitting): 4/4    Time  6    Period  Weeks    Status  Partially Met    Target Date  04/09/18      PT LONG TERM GOAL #3   Title  Patient will be independent with all upper body hygiene and dressing pain-free in order to promote independence.    Baseline  Unable; 02/26/18: Pt is independent    Time  6    Period  Weeks    Status  Achieved      PT LONG TERM GOAL #4   Title  Patient will improve Quick DASH score from 54% (moderate disability) to at most 46% in order to improve functional ability in ADLs.    Baseline  54% (moderate disability); 02/26/18: 56.82%    Time  6    Period  Weeks    Status  On-going    Target Date  04/09/18            Plan - 03/07/18 0802    Clinical Impression Statement  Patient continues to be challenged by shoulder extension with noted pain in anterior and lateral musculature of [place] joint. Rhythmic pertubation decreased pain and STM to pectoral musculature reduced tension and improved postural alignment.  Abduction limited initially with improved ROM with repetition. Pt will continue to benefit from skilled PT intervention for improvements in shoulder pain, ROM, strength, and function.     Rehab Potential  Good    Clinical Impairments Affecting Rehab Potential  pain, decreased ROM, strength deficits    PT Frequency  2x / week    PT Duration  6 weeks    PT Treatment/Interventions  Ultrasound;Moist Heat;Cryotherapy;Therapeutic activities;Therapeutic exercise;Neuromuscular re-education;Patient/family education;Manual techniques;Joint Manipulations;Dry needling;Taping  PT Next  Visit Plan  ROM, manual interventions, and strengthening    PT Home Exercise Plan  pulleys in scaption and flexion    Consulted and Agree with Plan of Care  Patient;Family member/caregiver    Family Member Consulted  Daughter [name])       Patient will benefit from skilled therapeutic intervention in order to improve the following deficits and impairments:  Decreased range of motion, Decreased strength, Impaired UE functional use, Pain, Hypomobility, Impaired perceived functional ability  Visit Diagnosis: Chronic left shoulder pain  Chronic right shoulder pain  Stiffness of right shoulder, not elsewhere classified  Stiffness of left shoulder, not elsewhere classified  Muscle weakness (generalized)     Problem List There are no active problems to display for this patient.  Janna Arch, PT, DPT   03/07/2018, 8:03 AM  [place] MAIN [facility] SERVICES [address] [place], [place], [address] Phone: [phone]   Fax:  [phone]  Name: SHAKEENA KAFER MRN: [id number] Date of Birth: [birth year]

## 2018-03-10 ENCOUNTER — Encounter: Payer: Medicare Other | Admitting: Physical Therapy

## 2018-03-11 ENCOUNTER — Encounter: Payer: Self-pay | Admitting: Physical Therapy

## 2018-03-11 ENCOUNTER — Ambulatory Visit: Payer: Medicare Other | Admitting: Physical Therapy

## 2018-03-11 DIAGNOSIS — M25512 Pain in left shoulder: Secondary | ICD-10-CM | POA: Diagnosis not present

## 2018-03-11 DIAGNOSIS — M6281 Muscle weakness (generalized): Secondary | ICD-10-CM

## 2018-03-11 DIAGNOSIS — G8929 Other chronic pain: Secondary | ICD-10-CM

## 2018-03-11 DIAGNOSIS — M25511 Pain in right shoulder: Secondary | ICD-10-CM

## 2018-03-11 DIAGNOSIS — M25611 Stiffness of right shoulder, not elsewhere classified: Secondary | ICD-10-CM

## 2018-03-11 DIAGNOSIS — M25612 Stiffness of left shoulder, not elsewhere classified: Secondary | ICD-10-CM

## 2018-03-11 NOTE — Therapy (Signed)
[place] MAIN [facility] SERVICES [address] [place], [place], [address] Phone: [phone]   Fax:  [phone]  Physical Therapy Treatment  Patient Details  Name: Haley Lucas MRN: [id number] Date of Birth: 04-01-1951 Referring Provider (PT): Neomia Dear   Encounter Date: 03/11/2018  PT End of Session - 03/11/18 1431    Visit Number  10    Number of Visits  25    Date for PT Re-Evaluation  04/09/18    Authorization Type  2/10, goals updated: 02/26/18    PT Start Time  1432    PT Stop Time  1515    PT Time Calculation (min)  43 min    Activity Tolerance  Patient tolerated treatment well    Behavior During Therapy  [facility] for tasks assessed/performed       Past Medical History:  Diagnosis Date  . High cholesterol   . Hypertension   . Thyroid disease     History reviewed. No pertinent surgical history.  There were no vitals filed for this visit.  Subjective Assessment - 03/11/18 1438    Subjective  Patient denies any pain in shoulders today; She reports, "I always have pain on the right side of my face always, but no pain in shoulders; Patient reports adherence to HEP working on improving bringing shoulders back;     Pertinent History  L shoulder pain starting Feb 2019, R shoulder pain starting June 2019. No confirmed MOI, but family member reported pain and stiffness is 2/2 to recovery s/p acoustic neuroma.    Limitations  Lifting;House hold activities    How long can you sit comfortably?  NA    How long can you stand comfortably?  >30 minutes    How long can you walk comfortably?  >1075f    Diagnostic tests  Xrays of B shoulder (9/27)- normal    Patient Stated Goals  reduce shoulder pain,     Currently in Pain?  Yes    Pain Score  7     Pain Location  Face    Pain Orientation  Right    Pain Descriptors / Indicators  Tingling    Pain Type  Chronic pain    Pain Onset  More than a month ago    Pain Frequency  Constant    Aggravating Factors   heat    Pain Relieving Factors  unknown    Effect of Pain on Daily Activities  no change;     Multiple Pain Sites  No          TREATMENT: Warm up with UE ranger, single UE flexion/exension x10 reps, end range flexion with horizontal abduction/adduction x10 reps; both done with each UE;  Sitting: PT performed mobilization with movement, using mob belt, shoulder flexion and abduction with inferior glide x10 reps each, each UE;  Standing with green tband in BUE: -scapular retraction low row 2x10 reps with min VCS to increase scapular retraction for better strengthening; -BUE shoulder extension 2x10 with min VCs to improve erect posture; -single UE shoulder IR 2x10 reps with min VCs for positioning to improve shoulder strengthening; -single UE shoulder ER 2x10 reps with min VCS to improve positioning for better strengthening;  Patient denies any increase pain in shoulders with advanced strengthening;    Sidelying: Shoulder ER 2x15 with min VCs for positioning; patient does have difficulty lying on her side due to acoustic neuroma but was able to turn neck so that she could lift  UE against gravity;   Supine: Grade II-III glenohumeral joint inferior mobilization 15 sec bouts x3 sets each UE Grade II-III glenohumeral joint AP/PA mobilization 15 sec bouts x3 sets each UE;  Following Manual therapy: Shoulder ROM: Flexion:  R 142       L:135 Abduction: R: 125   L130 IR           R55 L42 ER    R67 L60               PT Education - 03/11/18 1431    Education provided  Yes    Education Details  exercise technique, posture, ROM    Person(s) Educated  Patient    Methods  Explanation;Demonstration;Verbal cues    Comprehension  Verbalized understanding;Verbal cues required;Returned demonstration;Need further instruction       PT Short Term Goals - 02/26/18 1403      PT SHORT TERM GOAL #1   Title  Patient will be able to achieve B active assisted  shoulder flexion >140 degrees without pain in order to promote functional independence with ADLs.    Baseline  L 124, R 133; 02/26/18: L , R: 160, L: 150     Time  2    Period  Weeks    Status  Achieved    Target Date  03/19/18      PT SHORT TERM GOAL #2   Title  Patient will report a worst pain of 4/10 on NPRS in order to improve tolerance with ADLs and reduced symptoms with activities.     Baseline  7/10; 02/26/18: worst pain: 7/10 R shoulder, 6/10 L shoulder    Time  2    Period  Weeks    Status  Partially Met    Target Date  03/19/18        PT Long Term Goals - 02/26/18 1404      PT LONG TERM GOAL #1   Title  Patient will report a worst pain of 0/10 on NPRS in order to improve tolerance with ADLs and reduced symptoms with activities.     Baseline  7/10; 02/26/18: worst pain: 7/10 R shoulder, 6/10 L shoulder    Time  6    Period  Weeks    Status  Partially Met      PT LONG TERM GOAL #2   Title  Patient will increase BUE gross strength to 4+/5 in order to improve functional strength and promote independence with all ADLs.    Baseline  3-/5; 02/26/18: MMT R/L: Shoulder flexion 4/4, abduction: 4/4-, ER (in sitting): 4/4, IR (in sitting): 4/4    Time  6    Period  Weeks    Status  Partially Met    Target Date  04/09/18      PT LONG TERM GOAL #3   Title  Patient will be independent with all upper body hygiene and dressing pain-free in order to promote independence.    Baseline  Unable; 02/26/18: Pt is independent    Time  6    Period  Weeks    Status  Achieved      PT LONG TERM GOAL #4   Title  Patient will improve Quick DASH score from 54% (moderate disability) to at most 46% in order to improve functional ability in ADLs.    Baseline  54% (moderate disability); 02/26/18: 56.82%    Time  6    Period  Weeks    Status  On-going  Target Date  04/09/18            Plan - 03/11/18 1445    Clinical Impression Statement  Patient instructed in advanced UE  strengthening; patient does require min VCS to improve erect posture and UE positioning for better strengthening; She denies any increase in shoulder pain with advanced exercise; minimal compensation noted with green tband exercise;  Patient able to exhibit good flexibility and of session; She would benefit from additional skilled PT intervention to improve shoulder ROM and reduce pain with ADLs;    Rehab Potential  Good    Clinical Impairments Affecting Rehab Potential  pain, decreased ROM, strength deficits    PT Frequency  2x / week    PT Duration  6 weeks    PT Treatment/Interventions  Ultrasound;Moist Heat;Cryotherapy;Therapeutic activities;Therapeutic exercise;Neuromuscular re-education;Patient/family education;Manual techniques;Joint Manipulations;Dry needling;Taping    PT Next Visit Plan  ROM, manual interventions, and strengthening    PT Home Exercise Plan  pulleys in scaption and flexion    Consulted and Agree with Plan of Care  Patient;Family member/caregiver    Family Member Consulted  Daughter [name])       Patient will benefit from skilled therapeutic intervention in order to improve the following deficits and impairments:  Decreased range of motion, Decreased strength, Impaired UE functional use, Pain, Hypomobility, Impaired perceived functional ability  Visit Diagnosis: Chronic left shoulder pain  Chronic right shoulder pain  Stiffness of right shoulder, not elsewhere classified  Stiffness of left shoulder, not elsewhere classified  Muscle weakness (generalized)     Problem List There are no active problems to display for this patient.   Nahmir Zeidman PT, DPT 03/11/2018, 3:03 PM  [place] MAIN [facility] SERVICES [address] [place], [place], [address] Phone: [phone]   Fax:  [phone]  Name: Haley Lucas MRN: [id number] Date of Birth: [birth year]

## 2018-03-12 ENCOUNTER — Encounter: Payer: Medicare Other | Admitting: Physical Therapy

## 2018-03-20 ENCOUNTER — Ambulatory Visit: Payer: Medicare Other | Attending: *Deleted | Admitting: Physical Therapy

## 2018-03-20 ENCOUNTER — Encounter: Payer: Self-pay | Admitting: Physical Therapy

## 2018-03-20 DIAGNOSIS — M6281 Muscle weakness (generalized): Secondary | ICD-10-CM | POA: Insufficient documentation

## 2018-03-20 DIAGNOSIS — G8929 Other chronic pain: Secondary | ICD-10-CM | POA: Insufficient documentation

## 2018-03-20 DIAGNOSIS — M25611 Stiffness of right shoulder, not elsewhere classified: Secondary | ICD-10-CM | POA: Insufficient documentation

## 2018-03-20 DIAGNOSIS — M25512 Pain in left shoulder: Secondary | ICD-10-CM | POA: Insufficient documentation

## 2018-03-20 DIAGNOSIS — M25511 Pain in right shoulder: Secondary | ICD-10-CM | POA: Insufficient documentation

## 2018-03-20 DIAGNOSIS — M25612 Stiffness of left shoulder, not elsewhere classified: Secondary | ICD-10-CM

## 2018-03-20 NOTE — Therapy (Signed)
[place] MAIN [facility] SERVICES [address] [place], [place], [address] Phone: [phone]   Fax:  [phone]  Physical Therapy Treatment  Patient Details  Name: Haley Lucas MRN: [id number] Date of Birth: Aug 24, 1950 Referring Provider (PT): Neomia Dear   Encounter Date: 03/20/2018  PT End of Session - 03/20/18 1314    Visit Number  11    Number of Visits  25    Date for PT Re-Evaluation  04/09/18    Authorization Type  3/10 goals updated: 02/26/18    PT Start Time  1304    PT Stop Time  1345    PT Time Calculation (min)  41 min    Activity Tolerance  Patient tolerated treatment well    Behavior During Therapy  [facility] for tasks assessed/performed       Past Medical History:  Diagnosis Date  . High cholesterol   . Hypertension   . Thyroid disease     History reviewed. No pertinent surgical history.  There were no vitals filed for this visit.  Subjective Assessment - 03/20/18 1311    Subjective  Patient reports "doing well" She reported 7/10 shoulder pain after therapist asked about pain; She reports being able to do all ADLs, but does have some difficulty with sweeping;     Pertinent History  L shoulder pain starting Feb 2019, R shoulder pain starting June 2019. No confirmed MOI, but family member reported pain and stiffness is 2/2 to recovery s/p acoustic neuroma.    Limitations  Lifting;House hold activities    How long can you sit comfortably?  NA    How long can you stand comfortably?  >30 minutes    How long can you walk comfortably?  >10104f    Diagnostic tests  Xrays of B shoulder (9/27)- normal    Patient Stated Goals  reduce shoulder pain,     Currently in Pain?  Yes    Pain Score  7     Pain Location  Shoulder    Pain Orientation  Right;Left    Pain Descriptors / Indicators  Sore    Pain Type  Chronic pain    Pain Onset  More than a month ago    Pain Frequency  Constant    Aggravating Factors   reaching back     Pain Relieving Factors  unknown    Effect of Pain on Daily Activities  no change;     Multiple Pain Sites  No         TREATMENT: Warm up with UE ranger, single UE flexion/exension x10 reps, end range flexion with horizontal abduction/adduction x10 reps; both done with each UE;  Sitting: PT performed mobilization with movement, using mob belt, shoulder flexion and abduction with inferior glide x10 reps each, each UE;  Standing with green tband in BUE: -scapular retraction low row 2x12 reps with min VCS to increase scapular retraction for better strengthening; -BUE shoulder extension 2x12 with min VCs to improve erect posture; -single UE shoulder IR 2x12 reps with min VCs for positioning to improve shoulder strengthening; -single UE shoulder ER 2x12 reps with min VCS to improve positioning for better strengthening;  Patient denies any increase pain in shoulders with advanced strengthening;   Standing against wall: Red tband around BUE, scapular protraction x10 with mod VCs for positioning and exercise technique; Wall walk with BUE, red tband, low "V" to high "V", x5 reps Single UE lift off wall with BUE overhead ("I"  exercise) x10 bilaterally; Patient able to exhibit good UE control without increase in shoulder pain with advanced exercise;   Manual therapy: Supine: Grade II-III glenohumeral joint inferior mobilization 15 sec bouts x3 sets each UE Grade II-III glenohumeral joint AP/PA mobilization 15 sec bouts x3 sets each UE;  Following Manual therapy: Shoulder ROM: Flexion:  R 145       L:142 Abduction: R: 142   L122 IR           R62   L60 ER          R65   L52                        PT Education - 03/20/18 1313    Education provided  Yes    Education Details  exercise technique, posture, ROM/ HEP reinforced    Person(s) Educated  Patient    Methods  Explanation;Demonstration;Verbal cues    Comprehension  Verbalized understanding;Returned  demonstration;Verbal cues required;Need further instruction       PT Short Term Goals - 02/26/18 1403      PT SHORT TERM GOAL #1   Title  Patient will be able to achieve B active assisted shoulder flexion >140 degrees without pain in order to promote functional independence with ADLs.    Baseline  L 124, R 133; 02/26/18: L , R: 160, L: 150     Time  2    Period  Weeks    Status  Achieved    Target Date  03/19/18      PT SHORT TERM GOAL #2   Title  Patient will report a worst pain of 4/10 on NPRS in order to improve tolerance with ADLs and reduced symptoms with activities.     Baseline  7/10; 02/26/18: worst pain: 7/10 R shoulder, 6/10 L shoulder    Time  2    Period  Weeks    Status  Partially Met    Target Date  03/19/18        PT Long Term Goals - 02/26/18 1404      PT LONG TERM GOAL #1   Title  Patient will report a worst pain of 0/10 on NPRS in order to improve tolerance with ADLs and reduced symptoms with activities.     Baseline  7/10; 02/26/18: worst pain: 7/10 R shoulder, 6/10 L shoulder    Time  6    Period  Weeks    Status  Partially Met      PT LONG TERM GOAL #2   Title  Patient will increase BUE gross strength to 4+/5 in order to improve functional strength and promote independence with all ADLs.    Baseline  3-/5; 02/26/18: MMT R/L: Shoulder flexion 4/4, abduction: 4/4-, ER (in sitting): 4/4, IR (in sitting): 4/4    Time  6    Period  Weeks    Status  Partially Met    Target Date  04/09/18      PT LONG TERM GOAL #3   Title  Patient will be independent with all upper body hygiene and dressing pain-free in order to promote independence.    Baseline  Unable; 02/26/18: Pt is independent    Time  6    Period  Weeks    Status  Achieved      PT LONG TERM GOAL #4   Title  Patient will improve Quick DASH score from 54% (moderate disability) to at most 46% in  order to improve functional ability in ADLs.    Baseline  54% (moderate disability); 02/26/18: 56.82%     Time  6    Period  Weeks    Status  On-going    Target Date  04/09/18            Plan - 03/20/18 1320    Clinical Impression Statement  Patient instructed in advanced UE strengthening; patient does require min VCS to improve erect posture and UE positioning for better strengthening; She denies any increase in shoulder pain with advanced exercise; minimal compensation noted with green tband exercise;  Advanced strengthening with increased repetition; Patient tolerated advanced exercise with tband exercise against all; PT performed manual therapy to improve joint flexibility; Patient tolerated well; She was able to exhibit improved shoulder ROM at end of session; Patient would benefit from additional skilled PT intervention to improve ROM and strength;    Rehab Potential  Good    Clinical Impairments Affecting Rehab Potential  pain, decreased ROM, strength deficits    PT Frequency  2x / week    PT Duration  6 weeks    PT Treatment/Interventions  Ultrasound;Moist Heat;Cryotherapy;Therapeutic activities;Therapeutic exercise;Neuromuscular re-education;Patient/family education;Manual techniques;Joint Manipulations;Dry needling;Taping    PT Next Visit Plan  ROM, manual interventions, and strengthening    PT Home Exercise Plan  pulleys in scaption and flexion    Consulted and Agree with Plan of Care  Patient;Family member/caregiver    Family Member Consulted  Daughter [name])       Patient will benefit from skilled therapeutic intervention in order to improve the following deficits and impairments:  Decreased range of motion, Decreased strength, Impaired UE functional use, Pain, Hypomobility, Impaired perceived functional ability  Visit Diagnosis: Chronic left shoulder pain  Chronic right shoulder pain  Stiffness of right shoulder, not elsewhere classified  Stiffness of left shoulder, not elsewhere classified     Problem List There are no active problems to display for this  patient.   Devinne Epstein PT, DPT 03/20/2018, 1:51 PM  [place] MAIN [facility] SERVICES [address] [place], [place], [address] Phone: [phone]   Fax:  [phone]  Name: MEDIA PIZZINI MRN: [id number] Date of Birth: 03/22/1951

## 2018-03-26 ENCOUNTER — Ambulatory Visit: Payer: Medicare Other | Admitting: Physical Therapy

## 2018-03-26 ENCOUNTER — Encounter: Payer: Self-pay | Admitting: Physical Therapy

## 2018-03-26 DIAGNOSIS — M25511 Pain in right shoulder: Secondary | ICD-10-CM

## 2018-03-26 DIAGNOSIS — M25512 Pain in left shoulder: Principal | ICD-10-CM

## 2018-03-26 DIAGNOSIS — M6281 Muscle weakness (generalized): Secondary | ICD-10-CM

## 2018-03-26 DIAGNOSIS — M25611 Stiffness of right shoulder, not elsewhere classified: Secondary | ICD-10-CM

## 2018-03-26 DIAGNOSIS — M25612 Stiffness of left shoulder, not elsewhere classified: Secondary | ICD-10-CM

## 2018-03-26 DIAGNOSIS — G8929 Other chronic pain: Secondary | ICD-10-CM

## 2018-03-26 NOTE — Therapy (Signed)
[place] MAIN [facility] SERVICES [address] [place], [place], [address] Phone: [phone]   Fax:  [phone]  Physical Therapy Treatment  Patient Details  Name: Haley Lucas MRN: [id number] Date of Birth: Sep 09, 1950 Referring Provider (PT): Neomia Dear   Encounter Date: 03/26/2018  PT End of Session - 03/26/18 1535    Visit Number  12    Number of Visits  25    Date for PT Re-Evaluation  04/09/18    Authorization Type  4/10 goals updated: 02/26/18    PT Start Time  0150    PT Stop Time  0230    PT Time Calculation (min)  40 min    Activity Tolerance  Patient tolerated treatment well    Behavior During Therapy  [facility] for tasks assessed/performed       Past Medical History:  Diagnosis Date  . High cholesterol   . Hypertension   . Thyroid disease     History reviewed. No pertinent surgical history.  There were no vitals filed for this visit.  Subjective Assessment - 03/26/18 1534    Subjective  Patient reports "doing well" She reported 7/10 shoulder pain after therapist asked about pain; She reports being able to do all ADLs, but does have some difficulty with sweeping;     Pertinent History  L shoulder pain starting Feb 2019, R shoulder pain starting June 2019. No confirmed MOI, but family member reported pain and stiffness is 2/2 to recovery s/p acoustic neuroma.    Limitations  Lifting;House hold activities    How long can you sit comfortably?  NA    How long can you stand comfortably?  >30 minutes    How long can you walk comfortably?  >1035f    Diagnostic tests  Xrays of B shoulder (9/27)- normal    Patient Stated Goals  reduce shoulder pain,     Currently in Pain?  Yes    Pain Score  7     Pain Location  Arm    Pain Orientation  Right;Left    Pain Descriptors / Indicators  Aching    Pain Onset  More than a month ago    Aggravating Factors   activity    Effect of Pain on Daily Activities  difficult to perform  activities    Pain Onset  In the past 7 days           Manual therapy: Supine: Grade II-III glenohumeral joint inferior mobilization 15 sec boutsx3 sets each UE Grade II-III glenohumeral joint AP/PA mobilization 15 sec bouts x3 sets each UE;  Thoracic PA glides grade 3 and 4 : T1-T8 30 bouts x 2 STM to thoracic musculature including upper trap, middle trap, rhomboid musculature Roller stick to thoracic musculature and upper trap to reduce pain and tenderness Standing pec. minor stretch in doorframe 30 sec hold x 3 BUE Heat to right shoulder during manual therapy to L shoulder  Reviewed HEP  Patient reports that she feels better following treatment; unable to get patient to rate her pain and therapist asked twice with patient just responding that she feels better.                       PT Education - 03/26/18 1535    Education provided  Yes    Education Details  exercise technique, posture, ROM/ HEP reinforced    Person(s) Educated  Patient    Methods  Explanation  Comprehension  Verbalized understanding;Need further instruction;Returned demonstration       PT Short Term Goals - 02/26/18 1403      PT SHORT TERM GOAL #1   Title  Patient will be able to achieve B active assisted shoulder flexion >140 degrees without pain in order to promote functional independence with ADLs.    Baseline  L 124, R 133; 02/26/18: L , R: 160, L: 150     Time  2    Period  Weeks    Status  Achieved    Target Date  03/19/18      PT SHORT TERM GOAL #2   Title  Patient will report a worst pain of 4/10 on NPRS in order to improve tolerance with ADLs and reduced symptoms with activities.     Baseline  7/10; 02/26/18: worst pain: 7/10 R shoulder, 6/10 L shoulder    Time  2    Period  Weeks    Status  Partially Met    Target Date  03/19/18        PT Long Term Goals - 02/26/18 1404      PT LONG TERM GOAL #1   Title  Patient will report a worst pain of 0/10 on NPRS in order  to improve tolerance with ADLs and reduced symptoms with activities.     Baseline  7/10; 02/26/18: worst pain: 7/10 R shoulder, 6/10 L shoulder    Time  6    Period  Weeks    Status  Partially Met      PT LONG TERM GOAL #2   Title  Patient will increase BUE gross strength to 4+/5 in order to improve functional strength and promote independence with all ADLs.    Baseline  3-/5; 02/26/18: MMT R/L: Shoulder flexion 4/4, abduction: 4/4-, ER (in sitting): 4/4, IR (in sitting): 4/4    Time  6    Period  Weeks    Status  Partially Met    Target Date  04/09/18      PT LONG TERM GOAL #3   Title  Patient will be independent with all upper body hygiene and dressing pain-free in order to promote independence.    Baseline  Unable; 02/26/18: Pt is independent    Time  6    Period  Weeks    Status  Achieved      PT LONG TERM GOAL #4   Title  Patient will improve Quick DASH score from 54% (moderate disability) to at most 46% in order to improve functional ability in ADLs.    Baseline  54% (moderate disability); 02/26/18: 56.82%    Time  6    Period  Weeks    Status  On-going    Target Date  04/09/18            Plan - 03/26/18 1537    Clinical Impression Statement  Patient responds to manual therapy for STM to thoracic musculature and bilateral shoulder PROM , inferior glides and posterior glides to improve shoulder ROM and strength to improve quality of life and decrease B shoulder pain.     Rehab Potential  Good    Clinical Impairments Affecting Rehab Potential  pain, decreased ROM, strength deficits    PT Frequency  2x / week    PT Duration  6 weeks    PT Treatment/Interventions  Ultrasound;Moist Heat;Cryotherapy;Therapeutic activities;Therapeutic exercise;Neuromuscular re-education;Patient/family education;Manual techniques;Joint Manipulations;Dry needling;Taping    PT Next Visit Plan  ROM, manual interventions, and strengthening  PT Home Exercise Plan  pulleys in scaption and  flexion    Consulted and Agree with Plan of Care  Patient;Family member/caregiver    Family Member Consulted  Daughter [name])       Patient will benefit from skilled therapeutic intervention in order to improve the following deficits and impairments:  Decreased range of motion, Decreased strength, Impaired UE functional use, Pain, Hypomobility, Impaired perceived functional ability  Visit Diagnosis: Chronic left shoulder pain  Chronic right shoulder pain  Stiffness of right shoulder, not elsewhere classified  Stiffness of left shoulder, not elsewhere classified  Muscle weakness (generalized)     Problem List There are no active problems to display for this patient.   [address] [place] DPT 03/26/2018, 3:49 PM  [place] MAIN [facility] SERVICES [address] [place], [place], [address] Phone: [phone]   Fax:  [phone]  Name: Haley Lucas MRN: [id number] Date of Birth: 1950-09-20

## 2018-03-31 ENCOUNTER — Ambulatory Visit: Payer: Medicare Other

## 2018-03-31 DIAGNOSIS — M25511 Pain in right shoulder: Secondary | ICD-10-CM

## 2018-03-31 DIAGNOSIS — M25611 Stiffness of right shoulder, not elsewhere classified: Secondary | ICD-10-CM

## 2018-03-31 DIAGNOSIS — M6281 Muscle weakness (generalized): Secondary | ICD-10-CM

## 2018-03-31 DIAGNOSIS — M25512 Pain in left shoulder: Principal | ICD-10-CM

## 2018-03-31 DIAGNOSIS — G8929 Other chronic pain: Secondary | ICD-10-CM

## 2018-03-31 DIAGNOSIS — M25612 Stiffness of left shoulder, not elsewhere classified: Secondary | ICD-10-CM

## 2018-03-31 NOTE — Therapy (Signed)
[place] MAIN [facility] SERVICES [address] [place], [place], [address] Phone: [phone]   Fax:  [phone]  Physical Therapy Treatment/Progress Note  Dates of reporting period  02/26/18  to  03/31/18  Patient Details  Name: Haley Lucas MRN: [id number] Date of Birth: June 08, 1950 Referring Provider (PT): Neomia Dear   Encounter Date: 03/31/2018  PT End of Session - 03/31/18 1519    Visit Number  13    Number of Visits  25    Date for PT Re-Evaluation  04/09/18    Authorization Type  5/10 (next visit is 1/10) goals updated: 02/26/18, updated goals today 03/31/18     PT Start Time  1517    PT Stop Time  1600    PT Time Calculation (min)  43 min    Activity Tolerance  Patient tolerated treatment well    Behavior During Therapy  [facility] for tasks assessed/performed       Past Medical History:  Diagnosis Date  . High cholesterol   . Hypertension   . Thyroid disease     History reviewed. No pertinent surgical history.  There were no vitals filed for this visit.  Subjective Assessment - 03/31/18 1515    Subjective  Patient reports doing well upon arrival and denies any shoulder pain at rest. No specific questions or concerns at this time.    Pertinent History  L shoulder pain starting Feb 2019, R shoulder pain starting June 2019. No confirmed MOI, but family member reported pain and stiffness is 2/2 to recovery s/p acoustic neuroma.    Limitations  Lifting;House hold activities    How long can you sit comfortably?  NA    How long can you stand comfortably?  >30 minutes    How long can you walk comfortably?  >1087f    Diagnostic tests  Xrays of B shoulder (9/27)- normal    Patient Stated Goals  reduce shoulder pain,     Currently in Pain?  No/denies    Pain Onset  --    Pain Onset  --       TREATMENT  Manual Therapy  Heat to right shoulder during manual therapy to L shoulder (unbilled); Supine: Grade II-III glenohumeral  joint posterior and  inferior mobilization 20 sec boutsx 2 sets each UE Grade II-III glenohumeral joint AP mobilization 15 sec bouts x3 sets each UE;  PT performed mobilization with movement with hands providing inferior distraction force for shoulder shoulder flexion and abduction x 10 reps each;    Ther-ex  Warm-up on UBE 2 min forward, 2 min backwards during history (1 minute unbilled); Pt completed QuickDASH with assist from therapist, pt scored 59.1% impaired; Updated outcome measures and goals with patient, performed MMT (see below) Seated D1 flexion with green tband x 10 bilateral; Seated low rows with green tband x 10; Standing shoulder extension with green tband x 10 bilateral; Standing shoulder ER with scap retractions with green tband x 10;   MMT R/L: Shoulder flexion 4/4, abduction: 4/4, ER (in sitting): 4/4, IR (in sitting): 4/4    Pt educated throughout session about proper posture and technique with exercises. Improved exercise technique, movement at target joints, use of target muscles after min to mod verbal, visual, tactile cues.    Pt is not making progress with respect to her QuickDASH score. Her strength testing is also unchanged and she continues to report 6/10 bilateral shoulder pain at it's worst. She responds well to manual therapy  and strengthening today. Pt will benefit from PT services to address deficits in strength, balance, and mobility in order to return to full function at home.                  PT Short Term Goals - 03/31/18 1520      PT SHORT TERM GOAL #1   Title  Patient will be able to achieve B active assisted shoulder flexion >140 degrees without pain in order to promote functional independence with ADLs.    Baseline  L 124, R 133; 02/26/18: L , R: 160, L: 150    Time  2    Period  Weeks    Status  Achieved      PT SHORT TERM GOAL #2   Title  Patient will report a worst pain of 4/10 on NPRS in order to improve tolerance with  ADLs and reduced symptoms with activities.     Baseline  7/10; 02/26/18: worst pain: 7/10 R shoulder, 6/10 L shoulder; 03/31/18: 6/10    Time  2    Period  Weeks    Status  Partially Met    Target Date  03/19/18        PT Long Term Goals - 03/31/18 1522      PT LONG TERM GOAL #1   Title  Patient will report a worst pain of 0/10 on NPRS in order to improve tolerance with ADLs and reduced symptoms with activities.     Baseline  7/10; 02/26/18: worst pain: 7/10 R shoulder, 6/10 L shoulder; 03/31/18: 6/10 bilateral shoulder pain at worst    Time  6    Period  Weeks    Status  Partially Met    Target Date  04/09/18      PT LONG TERM GOAL #2   Title  Patient will increase BUE gross strength to 4+/5 in order to improve functional strength and promote independence with all ADLs.    Baseline  3-/5; 02/26/18: MMT R/L: Shoulder flexion 4/4, abduction: 4/4-, ER (in sitting): 4/4, IR (in sitting): 4/4; 02/26/18: MMT R/L: Shoulder flexion 4/4, abduction: 4/4-, ER (in sitting): 4/4, IR (in sitting): 4/4; 02/26/18: MMT R/L: Shoulder flexion 4/4, abduction: 4/4, ER (in sitting): 4/4, IR (in sitting): 4/4; 03/31/18: MMT R/L: Shoulder flexion 4/4, abduction: 4/4, ER (in sitting): 4/4, IR (in sitting): 4/4    Time  6    Period  Weeks    Status  Partially Met    Target Date  04/09/18      PT LONG TERM GOAL #3   Title  Patient will be independent with all upper body hygiene and dressing pain-free in order to promote independence.    Baseline  Unable; 02/26/18: Pt is independent    Time  6    Period  Weeks    Status  Achieved      PT LONG TERM GOAL #4   Title  Patient will improve Quick DASH score from 54% (moderate disability) to at most 46% in order to improve functional ability in ADLs.    Baseline  54% (moderate disability); 02/26/18: 56.82%; 03/31/18: 59.1%    Time  6    Period  Weeks    Status  On-going    Target Date  04/09/18            Plan - 03/31/18 1519    Clinical Impression  Statement  Pt is not making progress with respect to her QuickDASH score. Her strength testing  is also unchanged and she continues to report 6/10 bilateral shoulder pain at it's worst. She responds well to manual therapy and strengthening today. Pt will benefit from PT services to address deficits in strength, balance, and mobility in order to return to full function at home.     Rehab Potential  Good    Clinical Impairments Affecting Rehab Potential  pain, decreased ROM, strength deficits    PT Frequency  2x / week    PT Duration  6 weeks    PT Treatment/Interventions  Ultrasound;Moist Heat;Cryotherapy;Therapeutic activities;Therapeutic exercise;Neuromuscular re-education;Patient/family education;Manual techniques;Joint Manipulations;Dry needling;Taping    PT Next Visit Plan  ROM, manual interventions, and strengthening    PT Home Exercise Plan  pulleys in scaption and flexion    Consulted and Agree with Plan of Care  Patient;Family member/caregiver    Family Member Consulted  Daughter [name])       Patient will benefit from skilled therapeutic intervention in order to improve the following deficits and impairments:  Decreased range of motion, Decreased strength, Impaired UE functional use, Pain, Hypomobility, Impaired perceived functional ability  Visit Diagnosis: Chronic left shoulder pain  Chronic right shoulder pain  Stiffness of right shoulder, not elsewhere classified  Stiffness of left shoulder, not elsewhere classified  Muscle weakness (generalized)     Problem List There are no active problems to display for this patient.  Phillips Grout PT, DPT, GCS  Huprich,Jason 04/01/2018, 3:56 PM  [place] MAIN [facility] SERVICES [address] [place], [place], [address] Phone: [phone]   Fax:  [phone]  Name: AJA WHITEHAIR MRN: [id number] Date of Birth: Mar 05, 1951

## 2018-04-02 ENCOUNTER — Ambulatory Visit: Payer: Medicare Other

## 2018-04-02 DIAGNOSIS — M25512 Pain in left shoulder: Secondary | ICD-10-CM | POA: Diagnosis not present

## 2018-04-02 DIAGNOSIS — G8929 Other chronic pain: Secondary | ICD-10-CM

## 2018-04-02 DIAGNOSIS — M25511 Pain in right shoulder: Secondary | ICD-10-CM

## 2018-04-02 NOTE — Therapy (Signed)
[place] MAIN [facility] SERVICES [address] [place], [place], [address] Phone: [phone]   Fax:  [phone]  Physical Therapy Treatment/Recertification  Dates of reporting period  02/26/18   to   04/02/18   Patient Details  Name: Haley Lucas MRN: [id number] Date of Birth: Nov 02, 1950 Referring Provider (PT): Neomia Dear   Encounter Date: 04/02/2018  PT End of Session - 04/02/18 1514    Visit Number  14    Number of Visits  25    Date for PT Re-Evaluation  05/15/18    Authorization Type  Progress Note 2/10; goals updated: 03/31/18     PT Start Time  1517    PT Stop Time  1600    PT Time Calculation (min)  43 min    Activity Tolerance  Patient tolerated treatment well    Behavior During Therapy  [facility] for tasks assessed/performed       Past Medical History:  Diagnosis Date  . High cholesterol   . Hypertension   . Thyroid disease     History reviewed. No pertinent surgical history.  There were no vitals filed for this visit.  Subjective Assessment - 04/02/18 1514    Subjective  Patient reports doing well upon arrival and denies any shoulder pain at rest. No specific questions or concerns at this time.    Pertinent History  L shoulder pain starting Feb 2019, R shoulder pain starting June 2019. No confirmed MOI, but family member reported pain and stiffness is 2/2 to recovery s/p acoustic neuroma.    Limitations  Lifting;House hold activities    How long can you sit comfortably?  NA    How long can you stand comfortably?  >30 minutes    How long can you walk comfortably?  >1015f    Diagnostic tests  Xrays of B shoulder (9/27)- normal    Patient Stated Goals  reduce shoulder pain,     Currently in Pain?  Yes    Pain Score  6     Pain Location  Arm    Pain Orientation  Right;Left    Pain Descriptors / Indicators  Aching    Pain Type  Chronic pain    Pain Onset  More than a month ago    Pain Frequency  Constant            TREATMENT   Manual Therapy  Heat to right shoulder during manual therapy to bilateral shoulders during discussion with patient regarding plan of care;  Supine: Grade II-III glenohumeral joint posterior and  inferior mobilization 20 sec boutsx 2 sets each UE Grade II-III glenohumeral joint AP mobilization 15 sec bouts x3 sets each UE; Lateral mobilizatoins PT performed mobilization with movement with hands providing inferior distraction force for shoulder shoulder flexion and abduction x 10 reps each; AROM measures obtained in supine (see below):   R: flex: 160, abduction 126 ER: 71, IR: 70  L:  flex: 148, abduction 121 ER: 62, IR: 71 HBH: T3 bilaterally, HBB: L1 bilaterally    Ther-ex  Warm-up on UBE 2 min forward, 2 min backwards during history (1 minute unbilled); Seated D1 flexion with green tband x 10 bilateral; Seated low rows with green tband x 10; Standing shoulder ER with scap retractions with green tband x 10; 60% improved; since starting therapy;   Pt educated throughout session about proper posture and technique with exercises. Improved exercise technique, movement at target joints, use of target muscles after min  to mod verbal, visual, tactile cues.    Pt is not making progress with respect to her QuickDASH score or her other outcome measures. Her AROM/PROM readings have remained unchanged since the last time they were measured in November. However she does report 60% improvement in her shoulder dysfunction since starting therapy. She is unable to appropriate verbalize any functional activities that she is unable to perform currently because of her shoulders. Offered to speak with her daughter to get more clarification however pt does not want therapist calling her daughter during work hours. Will plan to speak with her on Friday. Her current certification period is almost completed so additional decisions will need to be made regarding her plan of care. Pt would  like to continue therapy but cannot verbalize how it is helping her shoulders. Pt will benefit from PT services to address deficits in strength, balance, and mobility in order to return to full function at home.                         PT Short Term Goals - 03/31/18 1520      PT SHORT TERM GOAL #1   Title  Patient will be able to achieve B active assisted shoulder flexion >140 degrees without pain in order to promote functional independence with ADLs.    Baseline  L 124, R 133; 02/26/18: L , R: 160, L: 150    Time  2    Period  Weeks    Status  Achieved      PT SHORT TERM GOAL #2   Title  Patient will report a worst pain of 4/10 on NPRS in order to improve tolerance with ADLs and reduced symptoms with activities.     Baseline  7/10; 02/26/18: worst pain: 7/10 R shoulder, 6/10 L shoulder; 03/31/18: 6/10    Time  2    Period  Weeks    Status  Partially Met    Target Date  03/19/18        PT Long Term Goals - 03/31/18 1522      PT LONG TERM GOAL #1   Title  Patient will report a worst pain of 0/10 on NPRS in order to improve tolerance with ADLs and reduced symptoms with activities.     Baseline  7/10; 02/26/18: worst pain: 7/10 R shoulder, 6/10 L shoulder; 03/31/18: 6/10 bilateral shoulder pain at worst    Time  6    Period  Weeks    Status  Partially Met    Target Date  04/09/18      PT LONG TERM GOAL #2   Title  Patient will increase BUE gross strength to 4+/5 in order to improve functional strength and promote independence with all ADLs.    Baseline  3-/5; 02/26/18: MMT R/L: Shoulder flexion 4/4, abduction: 4/4-, ER (in sitting): 4/4, IR (in sitting): 4/4; 02/26/18: MMT R/L: Shoulder flexion 4/4, abduction: 4/4-, ER (in sitting): 4/4, IR (in sitting): 4/4; 02/26/18: MMT R/L: Shoulder flexion 4/4, abduction: 4/4, ER (in sitting): 4/4, IR (in sitting): 4/4; 03/31/18: MMT R/L: Shoulder flexion 4/4, abduction: 4/4, ER (in sitting): 4/4, IR (in sitting): 4/4     Time  6    Period  Weeks    Status  Partially Met    Target Date  04/09/18      PT LONG TERM GOAL #3   Title  Patient will be independent with all upper body hygiene and dressing pain-free  in order to promote independence.    Baseline  Unable; 02/26/18: Pt is independent    Time  6    Period  Weeks    Status  Achieved      PT LONG TERM GOAL #4   Title  Patient will improve Quick DASH score from 54% (moderate disability) to at most 46% in order to improve functional ability in ADLs.    Baseline  54% (moderate disability); 02/26/18: 56.82%; 03/31/18: 59.1%    Time  6    Period  Weeks    Status  On-going    Target Date  04/09/18            Plan - 04/02/18 1525    Clinical Impression Statement  Pt is not making progress with respect to her QuickDASH score or her other outcome measures. Her AROM/PROM readings have remained unchanged since the last time they were measured in November. However she does report 60% improvement in her shoulder dysfunction since starting therapy. She is unable to appropriate verbalize any functional activities that she is unable to perform currently because of her shoulders. Offered to speak with her daughter to get more clarification however pt does not want therapist calling her daughter during work hours. Will plan to speak with her on Friday. Her current certification period is almost completed so additional decisions will need to be made regarding her plan of care. Pt would like to continue therapy but cannot verbalize how it is helping her shoulders. Pt will benefit from PT services to address deficits in strength, balance, and mobility in order to return to full function at home.     Rehab Potential  Good    Clinical Impairments Affecting Rehab Potential  pain, decreased ROM, strength deficits    PT Frequency  2x / week    PT Duration  6 weeks    PT Treatment/Interventions  Ultrasound;Moist Heat;Cryotherapy;Therapeutic activities;Therapeutic  exercise;Neuromuscular re-education;Patient/family education;Manual techniques;Joint Manipulations;Dry needling;Taping    PT Next Visit Plan  ROM, manual interventions, and strengthening    PT Home Exercise Plan  pulleys in scaption and flexion    Consulted and Agree with Plan of Care  Patient;Family member/caregiver    Family Member Consulted  Daughter [name])       Patient will benefit from skilled therapeutic intervention in order to improve the following deficits and impairments:  Decreased range of motion, Decreased strength, Impaired UE functional use, Pain, Hypomobility, Impaired perceived functional ability  Visit Diagnosis: Chronic left shoulder pain  Chronic right shoulder pain     Problem List There are no active problems to display for this patient.  Phillips Grout PT, DPT, GCS  Haley Lucas 04/03/2018, 11:06 AM  [place] MAIN [facility] SERVICES [address] [place], [place], [address] Phone: [phone]   Fax:  [phone]  Name: Haley Lucas MRN: [id number] Date of Birth: 21-Mar-1951

## 2018-04-10 ENCOUNTER — Ambulatory Visit: Payer: Medicare Other | Admitting: Physical Therapy

## 2018-04-10 DIAGNOSIS — M25511 Pain in right shoulder: Secondary | ICD-10-CM

## 2018-04-10 DIAGNOSIS — G8929 Other chronic pain: Secondary | ICD-10-CM

## 2018-04-10 DIAGNOSIS — M25612 Stiffness of left shoulder, not elsewhere classified: Secondary | ICD-10-CM

## 2018-04-10 DIAGNOSIS — M25512 Pain in left shoulder: Secondary | ICD-10-CM | POA: Diagnosis not present

## 2018-04-10 DIAGNOSIS — M25611 Stiffness of right shoulder, not elsewhere classified: Secondary | ICD-10-CM

## 2018-04-10 DIAGNOSIS — M6281 Muscle weakness (generalized): Secondary | ICD-10-CM

## 2018-04-10 NOTE — Therapy (Signed)
[place] MAIN [facility] SERVICES [address] [place], [place], [address] Phone: [phone]   Fax:  [phone]  Physical Therapy Treatment  Patient Details  Name: Haley Lucas MRN: [id number] Date of Birth: 07/27/1950 Referring Provider (PT): Neomia Dear   Encounter Date: 04/10/2018  PT End of Session - 04/10/18 1659    Visit Number  15    Number of Visits  25    Date for PT Re-Evaluation  05/15/18    Authorization Type  Progress Note 3/10; goals updated: 03/31/18     PT Start Time  3244    PT Stop Time  1730    PT Time Calculation (min)  40 min    Activity Tolerance  Patient tolerated treatment well    Behavior During Therapy  [facility] for tasks assessed/performed       Past Medical History:  Diagnosis Date  . High cholesterol   . Hypertension   . Thyroid disease     History reviewed. No pertinent surgical history.  There were no vitals filed for this visit.  Subjective Assessment - 04/10/18 1700    Subjective  Patient states that she is doing "fine" and reports that her shoulders are "much better." Patient denies any pain in shoulders at rest.    Pertinent History  L shoulder pain starting Feb 2019, R shoulder pain starting June 2019. No confirmed MOI, but family member reported pain and stiffness is 2/2 to recovery s/p acoustic neuroma.    Limitations  Lifting;House hold activities    How long can you sit comfortably?  NA    How long can you stand comfortably?  >30 minutes    How long can you walk comfortably?  >1053f    Diagnostic tests  Xrays of B shoulder (9/27)- normal    Patient Stated Goals  reduce shoulder pain,     Currently in Pain?  No/denies    Pain Onset  More than a month ago         TREATMENT Manual Therapy  Heat to B shoulders during manual therapy (unbilled); Supine: Grade II-III glenohumeral joint posterior and inferior mobilization 20 sec boutsx 2 sets each UE Grade II-III glenohumeral joint AP  mobilization 15 sec bouts x3 sets each UE;    Ther-ex  Warm-up on UBE 2 min forward, 2 min backwards  Supine D1 flexion with green tband x 15 bilateral; Seated low rows with green tband x 15; Seated shoulder extension with green tband x 15 bilateral; Seated shoulder ER with scap retractions with green tband x 20;     PT Education - 04/10/18 1659    Education provided  Yes    Education Details  exercise technique, posture, POC    Person(s) Educated  Patient    Methods  Explanation    Comprehension  Need further instruction;Verbalized understanding;Returned demonstration       PT Short Term Goals - 03/31/18 1520      PT SHORT TERM GOAL #1   Title  Patient will be able to achieve B active assisted shoulder flexion >140 degrees without pain in order to promote functional independence with ADLs.    Baseline  L 124, R 133; 02/26/18: L , R: 160, L: 150    Time  2    Period  Weeks    Status  Achieved      PT SHORT TERM GOAL #2   Title  Patient will report a worst pain of 4/10 on NPRS in  order to improve tolerance with ADLs and reduced symptoms with activities.     Baseline  7/10; 02/26/18: worst pain: 7/10 R shoulder, 6/10 L shoulder; 03/31/18: 6/10    Time  2    Period  Weeks    Status  Partially Met    Target Date  03/19/18        PT Long Term Goals - 03/31/18 1522      PT LONG TERM GOAL #1   Title  Patient will report a worst pain of 0/10 on NPRS in order to improve tolerance with ADLs and reduced symptoms with activities.     Baseline  7/10; 02/26/18: worst pain: 7/10 R shoulder, 6/10 L shoulder; 03/31/18: 6/10 bilateral shoulder pain at worst    Time  6    Period  Weeks    Status  Partially Met    Target Date  04/09/18      PT LONG TERM GOAL #2   Title  Patient will increase BUE gross strength to 4+/5 in order to improve functional strength and promote independence with all ADLs.    Baseline  3-/5; 02/26/18: MMT R/L: Shoulder flexion 4/4, abduction: 4/4-, ER (in  sitting): 4/4, IR (in sitting): 4/4; 02/26/18: MMT R/L: Shoulder flexion 4/4, abduction: 4/4-, ER (in sitting): 4/4, IR (in sitting): 4/4; 02/26/18: MMT R/L: Shoulder flexion 4/4, abduction: 4/4, ER (in sitting): 4/4, IR (in sitting): 4/4; 03/31/18: MMT R/L: Shoulder flexion 4/4, abduction: 4/4, ER (in sitting): 4/4, IR (in sitting): 4/4    Time  6    Period  Weeks    Status  Partially Met    Target Date  04/09/18      PT LONG TERM GOAL #3   Title  Patient will be independent with all upper body hygiene and dressing pain-free in order to promote independence.    Baseline  Unable; 02/26/18: Pt is independent    Time  6    Period  Weeks    Status  Achieved      PT LONG TERM GOAL #4   Title  Patient will improve Quick DASH score from 54% (moderate disability) to at most 46% in order to improve functional ability in ADLs.    Baseline  54% (moderate disability); 02/26/18: 56.82%; 03/31/18: 59.1%    Time  6    Period  Weeks    Status  On-going    Target Date  04/09/18            Plan - 04/11/18 1001    Clinical Impression Statement  Patient presents to therapy with decreased pain report in B shoulders and is amenable to therapy. Patient demonstrated some postural compensations (posterior lean) for achieving end range scapular retraction, but was able to correct with minimal VCs. Patient demonstrates competence with all other strengthening activties. Patient will continue to benefit from skilled therapeutic intervention to progress strengthening in order to eventually transition to an independent management program and return to her PLOF.     Rehab Potential  Good    Clinical Impairments Affecting Rehab Potential  pain, decreased ROM, strength deficits    PT Frequency  2x / week    PT Duration  6 weeks    PT Treatment/Interventions  Ultrasound;Moist Heat;Cryotherapy;Therapeutic activities;Therapeutic exercise;Neuromuscular re-education;Patient/family education;Manual techniques;Joint  Manipulations;Dry needling;Taping    PT Next Visit Plan  ROM, manual interventions, and strengthening    PT Home Exercise Plan  pulleys in scaption and flexion    Consulted and Agree with Plan  of Care  Patient;Family member/caregiver    Family Member Consulted  Daughter [name])       Patient will benefit from skilled therapeutic intervention in order to improve the following deficits and impairments:  Decreased range of motion, Decreased strength, Impaired UE functional use, Pain, Hypomobility, Impaired perceived functional ability  Visit Diagnosis: Chronic left shoulder pain  Chronic right shoulder pain  Stiffness of right shoulder, not elsewhere classified  Stiffness of left shoulder, not elsewhere classified  Muscle weakness (generalized)     Problem List There are no active problems to display for this patient.   Myles Gip PT, DPT [id number] 04/11/2018, 10:06 AM  [place] MAIN [facility] SERVICES [address] [place], [place], [address] Phone: [phone]   Fax:  [phone]  Name: IMAGINE NEST MRN: [id number] Date of Birth: 11-Jul-1950

## 2018-04-10 NOTE — Addendum Note (Signed)
Addended by: Ria CommentHUPRICH, Shirl Weir D on: 04/10/2018 02:09 PM   Modules accepted: Orders

## 2018-04-11 ENCOUNTER — Encounter: Payer: Self-pay | Admitting: Physical Therapy

## 2018-04-21 ENCOUNTER — Ambulatory Visit: Payer: Medicare Other | Attending: *Deleted

## 2018-04-21 VITALS — BP 136/72 | HR 61

## 2018-04-21 DIAGNOSIS — G8929 Other chronic pain: Secondary | ICD-10-CM

## 2018-04-21 DIAGNOSIS — M25612 Stiffness of left shoulder, not elsewhere classified: Secondary | ICD-10-CM | POA: Diagnosis present

## 2018-04-21 DIAGNOSIS — M25611 Stiffness of right shoulder, not elsewhere classified: Secondary | ICD-10-CM | POA: Diagnosis present

## 2018-04-21 DIAGNOSIS — M25511 Pain in right shoulder: Secondary | ICD-10-CM | POA: Insufficient documentation

## 2018-04-21 DIAGNOSIS — M25512 Pain in left shoulder: Secondary | ICD-10-CM | POA: Insufficient documentation

## 2018-04-21 NOTE — Therapy (Signed)
[place] MAIN [facility] SERVICES [address] [place], [place], [address] Phone: [phone]   Fax:  [phone]  Physical Therapy Treatment  Patient Details  Name: Haley Lucas MRN: [id number] Date of Birth: October 29, 1950 Referring Provider (PT): Neomia Dear   Encounter Date: 04/21/2018  PT End of Session - 04/22/18 1148    Visit Number  16    Number of Visits  25    Date for PT Re-Evaluation  05/15/18    Authorization Type  Progress Note 4/10; goals updated: 03/31/18     PT Start Time  1525    PT Stop Time  1600    PT Time Calculation (min)  35 min    Activity Tolerance  Patient tolerated treatment well    Behavior During Therapy  [facility] for tasks assessed/performed       Past Medical History:  Diagnosis Date  . High cholesterol   . Hypertension   . Thyroid disease     History reviewed. No pertinent surgical history.  Vitals:   04/21/18 1534  BP: 136/72  Pulse: 61    Subjective Assessment - 04/22/18 1147    Subjective  Pt reports that she is doing well today. She is complaining 5/10 L shoulder pain and 4/10 R shoulder pain. She had a severe headache and went to [facility] ED on 04/21/27 due to a severe headache. She had labwork and a head CT which did not show any acute problems. Her blood pressure was 201/94 upon arrival but pt reports it was back down to normal by the time she discharged that evening. Pt denies being on any chronic antihypertensive medications. She states that she has a follow-up appointment scheduled with her PCP.     Pertinent History  L shoulder pain starting Feb 2019, R shoulder pain starting June 2019. No confirmed MOI, but family member reported pain and stiffness is 2/2 to recovery s/p acoustic neuroma.    Limitations  Lifting;House hold activities    How long can you sit comfortably?  NA    How long can you stand comfortably?  >30 minutes    How long can you walk comfortably?  >1063f    Diagnostic tests  Xrays of B  shoulder (9/27)- normal    Patient Stated Goals  reduce shoulder pain,     Currently in Pain?  Yes    Pain Score  --   4/10 on the right, 5/10 on the left   Pain Location  Shoulder    Pain Orientation  Right;Left    Pain Descriptors / Indicators  Aching    Pain Type  Chronic pain    Pain Onset  More than a month ago         TREATMENT    Manual Therapy Supine: PROM bilateral shoulders for flexion, abduction, IR, and ER;  Grade II-III glenohumeral jointposterior andinferior mobilization 20sec/bout x 2sets each UE; Grade II-III glenohumeral joint inferior mobilizations at available end range 20 sec/bout x 2 bouts each UE; PT performed mobilization with movementwith hands providing inferior distraction force for shouldershoulder flexion and abduction x 10 reps each;   Ther-ex  SciFit UBE x 7 minutes for warm-up unbilled; D1 flexion with green tband x 10 bilateral; Rows in standing with green tband x 10 with cues for scapular retraction; Incline push-up 2 x 10 on mat table; Reaching overhead to shelf while therapist provides varying dynamic resistance with green tband x 10 on each side;    Pt  educated throughout session about proper posture and technique with exercises. Improved exercise technique, movement at target joints, use of target muscles after min to mod verbal, visual, tactile cues.    Pt arrived late for her appointment so session was abbreviated accordingly. She continues to report end range pain during bilateral shoulder PROM and manual therapy. Progressed exercises today and incorporated overhead reaching. Pt encouraged to continue HEP and follow-up as scheduled. Pt will benefit from PT services to address deficits in strength, balance, and mobility in order to return to full function at home.                  PT Short Term Goals - 03/31/18 1520      PT SHORT TERM GOAL #1   Title  Patient will be able to achieve B active assisted shoulder  flexion >140 degrees without pain in order to promote functional independence with ADLs.    Baseline  L 124, R 133; 02/26/18: L , R: 160, L: 150    Time  2    Period  Weeks    Status  Achieved      PT SHORT TERM GOAL #2   Title  Patient will report a worst pain of 4/10 on NPRS in order to improve tolerance with ADLs and reduced symptoms with activities.     Baseline  7/10; 02/26/18: worst pain: 7/10 R shoulder, 6/10 L shoulder; 03/31/18: 6/10    Time  2    Period  Weeks    Status  Partially Met    Target Date  03/19/18        PT Long Term Goals - 03/31/18 1522      PT LONG TERM GOAL #1   Title  Patient will report a worst pain of 0/10 on NPRS in order to improve tolerance with ADLs and reduced symptoms with activities.     Baseline  7/10; 02/26/18: worst pain: 7/10 R shoulder, 6/10 L shoulder; 03/31/18: 6/10 bilateral shoulder pain at worst    Time  6    Period  Weeks    Status  Partially Met    Target Date  04/09/18      PT LONG TERM GOAL #2   Title  Patient will increase BUE gross strength to 4+/5 in order to improve functional strength and promote independence with all ADLs.    Baseline  3-/5; 02/26/18: MMT R/L: Shoulder flexion 4/4, abduction: 4/4-, ER (in sitting): 4/4, IR (in sitting): 4/4; 02/26/18: MMT R/L: Shoulder flexion 4/4, abduction: 4/4-, ER (in sitting): 4/4, IR (in sitting): 4/4; 02/26/18: MMT R/L: Shoulder flexion 4/4, abduction: 4/4, ER (in sitting): 4/4, IR (in sitting): 4/4; 03/31/18: MMT R/L: Shoulder flexion 4/4, abduction: 4/4, ER (in sitting): 4/4, IR (in sitting): 4/4    Time  6    Period  Weeks    Status  Partially Met    Target Date  04/09/18      PT LONG TERM GOAL #3   Title  Patient will be independent with all upper body hygiene and dressing pain-free in order to promote independence.    Baseline  Unable; 02/26/18: Pt is independent    Time  6    Period  Weeks    Status  Achieved      PT LONG TERM GOAL #4   Title  Patient will improve Quick  DASH score from 54% (moderate disability) to at most 46% in order to improve functional ability in ADLs.  Baseline  54% (moderate disability); 02/26/18: 56.82%; 03/31/18: 59.1%    Time  6    Period  Weeks    Status  On-going    Target Date  04/09/18            Plan - 04/22/18 1149    Clinical Impression Statement  Pt arrived late for her appointment so session was abbreviated accordingly. She continues to report end range pain during bilateral shoulder PROM and manual therapy. Progressed exercises today and incorporated overhead reaching. Pt encouraged to continue HEP and follow-up as scheduled. Pt will benefit from PT services to address deficits in strength, balance, and mobility in order to return to full function at home.    Rehab Potential  Good    Clinical Impairments Affecting Rehab Potential  pain, decreased ROM, strength deficits    PT Frequency  2x / week    PT Duration  6 weeks    PT Treatment/Interventions  Ultrasound;Moist Heat;Cryotherapy;Therapeutic activities;Therapeutic exercise;Neuromuscular re-education;Patient/family education;Manual techniques;Joint Manipulations;Dry needling;Taping    PT Next Visit Plan  ROM, manual interventions, and strengthening    PT Home Exercise Plan  pulleys in scaption and flexion    Consulted and Agree with Plan of Care  Patient;Family member/caregiver    Family Member Consulted  Daughter [name])       Patient will benefit from skilled therapeutic intervention in order to improve the following deficits and impairments:  Decreased range of motion, Decreased strength, Impaired UE functional use, Pain, Hypomobility, Impaired perceived functional ability  Visit Diagnosis: Chronic left shoulder pain  Chronic right shoulder pain  Stiffness of right shoulder, not elsewhere classified  Stiffness of left shoulder, not elsewhere classified     Problem List There are no active problems to display for this patient.  Phillips Grout  PT, DPT, GCS  , 04/22/2018, 11:57 AM  [place] MAIN [facility] SERVICES [address] [place], [place], [address] Phone: [phone]   Fax:  [phone]  Name: Haley Lucas MRN: [id number] Date of Birth: Aug 28, 1950

## 2018-04-23 ENCOUNTER — Ambulatory Visit: Payer: Medicare Other

## 2018-04-23 DIAGNOSIS — M25512 Pain in left shoulder: Secondary | ICD-10-CM | POA: Diagnosis not present

## 2018-04-23 DIAGNOSIS — M25612 Stiffness of left shoulder, not elsewhere classified: Secondary | ICD-10-CM

## 2018-04-23 DIAGNOSIS — M25611 Stiffness of right shoulder, not elsewhere classified: Secondary | ICD-10-CM

## 2018-04-23 DIAGNOSIS — M25511 Pain in right shoulder: Secondary | ICD-10-CM

## 2018-04-23 DIAGNOSIS — G8929 Other chronic pain: Secondary | ICD-10-CM

## 2018-04-23 NOTE — Therapy (Signed)
[place] MAIN [facility] SERVICES [address] [place], [place], [address] Phone: [phone]   Fax:  [phone]  Physical Therapy Treatment  Patient Details  Name: Haley Lucas MRN: [id number] Date of Birth: 12-Apr-1951 Referring Provider (PT): Neomia Dear   Encounter Date: 04/23/2018  PT End of Session - 04/23/18 1307    Visit Number  17    Number of Visits  25    Date for PT Re-Evaluation  05/15/18    Authorization Type  Progress Note 5/10; goals updated: 03/31/18     PT Start Time  1303    PT Stop Time  1345    PT Time Calculation (min)  42 min    Activity Tolerance  Patient tolerated treatment well    Behavior During Therapy  [facility] for tasks assessed/performed       Past Medical History:  Diagnosis Date  . High cholesterol   . Hypertension   . Thyroid disease     History reviewed. No pertinent surgical history.  There were no vitals filed for this visit.  Subjective Assessment - 04/23/18 1306    Subjective  Pt reports that she is doing well today. She is complaining 5/10 R shoulder pain and 4/10 L shoulder pain. No further headaches. No specific questions or concerns upon arrival.     Pertinent History  L shoulder pain starting Feb 2019, R shoulder pain starting June 2019. No confirmed MOI, but family member reported pain and stiffness is 2/2 to recovery s/p acoustic neuroma.    Limitations  Lifting;House hold activities    How long can you sit comfortably?  NA    How long can you stand comfortably?  >30 minutes    How long can you walk comfortably?  >1041f    Diagnostic tests  Xrays of B shoulder (9/27)- normal    Patient Stated Goals  reduce shoulder pain,     Currently in Pain?  Yes    Pain Score  --   5/10 on the R and 4/10 on the L   Pain Location  Shoulder    Pain Orientation  Right;Left    Pain Descriptors / Indicators  Aching    Pain Type  Chronic pain    Pain Onset  More than a month ago             TREATMENT    Manual Therapy Supine: PROM bilateral shoulders for flexion, abduction, IR, and ER;  Grade II-III glenohumeral jointposterior andinferior mobilization 20sec/bout x 2sets each UE; Grade II-III glenohumeral joint inferior mobilizations at available end range 20 sec/bout x 2 bouts each UE;   Ther-ex  SciFit UBE x 4 minutes for warm-up unbilled; Rows in sitting with green tband x 10 with cues for scapular retraction; Standing green tband resisted ER and retraction x 10; Standing green tband resisted shoulder flexion x 10 bilateral; UE Ranger shoulder flexion x 10 bilateral; UE Ranger shoulder abduction x 10 bilateral; UE Ranger shoulder circles x 10 CW, x 10 CCW; Incline push-up 2 x 10 on parallel bars;    Pt educated throughout session about proper posture and technique with exercises. Improved exercise technique, movement at target joints, use of target muscles after min to mod verbal, visual, tactile cues.    Pt continues to report end range pain during bilateral shoulder PROM and manual therapy. Range is possibly progressing slightly but not measured on this date. Progressed strengthening exercises today and continued to incorporated overhead reaching. Pt  encouraged to continue HEP and follow-up as scheduled.Pt will benefit from PT services to address deficits in strength, balance, and mobility in order to return to full function at home.                     PT Short Term Goals - 03/31/18 1520      PT SHORT TERM GOAL #1   Title  Patient will be able to achieve B active assisted shoulder flexion >140 degrees without pain in order to promote functional independence with ADLs.    Baseline  L 124, R 133; 02/26/18: L , R: 160, L: 150    Time  2    Period  Weeks    Status  Achieved      PT SHORT TERM GOAL #2   Title  Patient will report a worst pain of 4/10 on NPRS in order to improve tolerance with ADLs and reduced symptoms with  activities.     Baseline  7/10; 02/26/18: worst pain: 7/10 R shoulder, 6/10 L shoulder; 03/31/18: 6/10    Time  2    Period  Weeks    Status  Partially Met    Target Date  03/19/18        PT Long Term Goals - 03/31/18 1522      PT LONG TERM GOAL #1   Title  Patient will report a worst pain of 0/10 on NPRS in order to improve tolerance with ADLs and reduced symptoms with activities.     Baseline  7/10; 02/26/18: worst pain: 7/10 R shoulder, 6/10 L shoulder; 03/31/18: 6/10 bilateral shoulder pain at worst    Time  6    Period  Weeks    Status  Partially Met    Target Date  04/09/18      PT LONG TERM GOAL #2   Title  Patient will increase BUE gross strength to 4+/5 in order to improve functional strength and promote independence with all ADLs.    Baseline  3-/5; 02/26/18: MMT R/L: Shoulder flexion 4/4, abduction: 4/4-, ER (in sitting): 4/4, IR (in sitting): 4/4; 02/26/18: MMT R/L: Shoulder flexion 4/4, abduction: 4/4-, ER (in sitting): 4/4, IR (in sitting): 4/4; 02/26/18: MMT R/L: Shoulder flexion 4/4, abduction: 4/4, ER (in sitting): 4/4, IR (in sitting): 4/4; 03/31/18: MMT R/L: Shoulder flexion 4/4, abduction: 4/4, ER (in sitting): 4/4, IR (in sitting): 4/4    Time  6    Period  Weeks    Status  Partially Met    Target Date  04/09/18      PT LONG TERM GOAL #3   Title  Patient will be independent with all upper body hygiene and dressing pain-free in order to promote independence.    Baseline  Unable; 02/26/18: Pt is independent    Time  6    Period  Weeks    Status  Achieved      PT LONG TERM GOAL #4   Title  Patient will improve Quick DASH score from 54% (moderate disability) to at most 46% in order to improve functional ability in ADLs.    Baseline  54% (moderate disability); 02/26/18: 56.82%; 03/31/18: 59.1%    Time  6    Period  Weeks    Status  On-going    Target Date  04/09/18            Plan - 04/23/18 1308    Clinical Impression Statement  Pt continues to  report end range pain  during bilateral shoulder PROM and manual therapy. Range is possibly progressing slightly but not measured on this date. Progressed strengthening exercises today and continued to incorporated overhead reaching. Pt encouraged to continue HEP and follow-up as scheduled.Pt will benefit from PT services to address deficits in strength, balance, and mobility in order to return to full function at home.    Rehab Potential  Good    Clinical Impairments Affecting Rehab Potential  pain, decreased ROM, strength deficits    PT Frequency  2x / week    PT Duration  6 weeks    PT Treatment/Interventions  Ultrasound;Moist Heat;Cryotherapy;Therapeutic activities;Therapeutic exercise;Neuromuscular re-education;Patient/family education;Manual techniques;Joint Manipulations;Dry needling;Taping    PT Next Visit Plan  ROM, manual interventions, and strengthening    PT Home Exercise Plan  pulleys in scaption and flexion    Consulted and Agree with Plan of Care  Patient;Family member/caregiver    Family Member Consulted  Daughter [name])       Patient will benefit from skilled therapeutic intervention in order to improve the following deficits and impairments:  Decreased range of motion, Decreased strength, Impaired UE functional use, Pain, Hypomobility, Impaired perceived functional ability  Visit Diagnosis: Chronic left shoulder pain  Chronic right shoulder pain  Stiffness of right shoulder, not elsewhere classified  Stiffness of left shoulder, not elsewhere classified     Problem List There are no active problems to display for this patient.  Phillips Grout PT, DPT, GCS  Adalberto Metzgar 04/23/2018, 3:03 PM  [place] MAIN [facility] SERVICES [address] [place], [place], [address] Phone: [phone]   Fax:  [phone]  Name: BIRD TAILOR MRN: [id number] Date of Birth: [birth year]

## 2018-05-01 ENCOUNTER — Ambulatory Visit: Payer: Medicare Other

## 2018-05-01 DIAGNOSIS — M25612 Stiffness of left shoulder, not elsewhere classified: Secondary | ICD-10-CM

## 2018-05-01 DIAGNOSIS — M25511 Pain in right shoulder: Secondary | ICD-10-CM

## 2018-05-01 DIAGNOSIS — M25512 Pain in left shoulder: Secondary | ICD-10-CM | POA: Diagnosis not present

## 2018-05-01 DIAGNOSIS — G8929 Other chronic pain: Secondary | ICD-10-CM

## 2018-05-01 DIAGNOSIS — M25611 Stiffness of right shoulder, not elsewhere classified: Secondary | ICD-10-CM

## 2018-05-01 NOTE — Therapy (Signed)
[place] MAIN [facility] SERVICES [address] [place], [place], [address] Phone: [phone]   Fax:  [phone]  Physical Therapy Treatment  Patient Details  Name: Haley Lucas MRN: [id number] Date of Birth: 1951-01-07 Referring Provider (PT): Neomia Dear   Encounter Date: 05/01/2018  PT End of Session - 05/01/18 1546    Visit Number  18    Number of Visits  25    Date for PT Re-Evaluation  05/15/18    Authorization Type  goals updated: 03/31/18     PT Start Time  1515    PT Stop Time  1600    PT Time Calculation (min)  45 min    Activity Tolerance  Patient tolerated treatment well    Behavior During Therapy  [facility] for tasks assessed/performed       Past Medical History:  Diagnosis Date  . High cholesterol   . Hypertension   . Thyroid disease     History reviewed. No pertinent surgical history.  There were no vitals filed for this visit.  Subjective Assessment - 05/01/18 1545    Subjective  Pt reports that she is doing well today. She is complaining 5/10 R shoulder pain and 4/10 L shoulder pain. No further headaches. No specific questions or concerns upon arrival.     Pertinent History  L shoulder pain starting Feb 2019, R shoulder pain starting June 2019. No confirmed MOI, but family member reported pain and stiffness is 2/2 to recovery s/p acoustic neuroma.    Limitations  Lifting;House hold activities    How long can you sit comfortably?  NA    How long can you stand comfortably?  >30 minutes    How long can you walk comfortably?  >1018f    Diagnostic tests  Xrays of B shoulder (9/27)- normal    Patient Stated Goals  reduce shoulder pain,     Currently in Pain?  Yes    Pain Score  --   5/10 on R, 4/10 on L   Pain Location  Shoulder    Pain Orientation  Right;Left    Pain Descriptors / Indicators  Aching    Pain Type  Chronic pain    Pain Onset  More than a month ago          TREATMENT   Manual  Therapy Supine: PROM bilateral shoulders for flexion, abduction, IR, and ER; Grade II-III glenohumeral jointposterior andinferior mobilization 20sec/boutx 2sets each UE; Grade II-III glenohumeral joint inferior mobilizations at available end range 20 sec/bout x 2 bouts each UE;   Ther-ex SciFit UBE x 4 minutes for warm-up unbilled; UE Ranger shoulder flexion x 20 bilateral; UE Ranger shoulder abduction x 20 bilateral; UE Ranger shoulder circles x 20 CW, x 20 CCW; Rows in standing with green tband x 10 with cues for scapular retraction; Education and handout about desensitization exercises for her facial pain;   Pt educated throughout session about proper posture and technique with exercises. Improved exercise technique, movement at target joints, use of target muscles after min to mod verbal, visual, tactile cues.   Pt continues to report end range pain during bilateral shoulder PROM and manual therapy. She reports some improvement in her bilateral shoulder IR when reaching behind her back. She complains of increased sensitivity to her face. Pt was provided an education and handout regarding desensitization exercises she can perform at home herself or with the assistance of her husband. She will need her goals updated over  the next couple of visits. Pt encouraged to continue HEP and follow-up as scheduled.Pt will benefit from PT services to address deficits in strength, balance, and mobility in order to return to full function at home.                       PT Short Term Goals - 03/31/18 1520      PT SHORT TERM GOAL #1   Title  Patient will be able to achieve B active assisted shoulder flexion >140 degrees without pain in order to promote functional independence with ADLs.    Baseline  L 124, R 133; 02/26/18: L , R: 160, L: 150    Time  2    Period  Weeks    Status  Achieved      PT SHORT TERM GOAL #2   Title  Patient will report a worst pain of  4/10 on NPRS in order to improve tolerance with ADLs and reduced symptoms with activities.     Baseline  7/10; 02/26/18: worst pain: 7/10 R shoulder, 6/10 L shoulder; 03/31/18: 6/10    Time  2    Period  Weeks    Status  Partially Met    Target Date  03/19/18        PT Long Term Goals - 03/31/18 1522      PT LONG TERM GOAL #1   Title  Patient will report a worst pain of 0/10 on NPRS in order to improve tolerance with ADLs and reduced symptoms with activities.     Baseline  7/10; 02/26/18: worst pain: 7/10 R shoulder, 6/10 L shoulder; 03/31/18: 6/10 bilateral shoulder pain at worst    Time  6    Period  Weeks    Status  Partially Met    Target Date  04/09/18      PT LONG TERM GOAL #2   Title  Patient will increase BUE gross strength to 4+/5 in order to improve functional strength and promote independence with all ADLs.    Baseline  3-/5; 02/26/18: MMT R/L: Shoulder flexion 4/4, abduction: 4/4-, ER (in sitting): 4/4, IR (in sitting): 4/4; 02/26/18: MMT R/L: Shoulder flexion 4/4, abduction: 4/4-, ER (in sitting): 4/4, IR (in sitting): 4/4; 02/26/18: MMT R/L: Shoulder flexion 4/4, abduction: 4/4, ER (in sitting): 4/4, IR (in sitting): 4/4; 03/31/18: MMT R/L: Shoulder flexion 4/4, abduction: 4/4, ER (in sitting): 4/4, IR (in sitting): 4/4    Time  6    Period  Weeks    Status  Partially Met    Target Date  04/09/18      PT LONG TERM GOAL #3   Title  Patient will be independent with all upper body hygiene and dressing pain-free in order to promote independence.    Baseline  Unable; 02/26/18: Pt is independent    Time  6    Period  Weeks    Status  Achieved      PT LONG TERM GOAL #4   Title  Patient will improve Quick DASH score from 54% (moderate disability) to at most 46% in order to improve functional ability in ADLs.    Baseline  54% (moderate disability); 02/26/18: 56.82%; 03/31/18: 59.1%    Time  6    Period  Weeks    Status  On-going    Target Date  04/09/18             Plan - 05/01/18 1546    Rehab Potential  Good    Clinical Impairments Affecting Rehab Potential  pain, decreased ROM, strength deficits    PT Frequency  2x / week    PT Duration  6 weeks    PT Treatment/Interventions  Ultrasound;Moist Heat;Cryotherapy;Therapeutic activities;Therapeutic exercise;Neuromuscular re-education;Patient/family education;Manual techniques;Joint Manipulations;Dry needling;Taping    PT Next Visit Plan  update goals over the next couple visits, ROM, manual interventions, and strengthening    PT Home Exercise Plan  pulleys in scaption and flexion    Consulted and Agree with Plan of Care  Patient;Family member/caregiver    Family Member Consulted  Daughter Haley Lucas)       Patient will benefit from skilled therapeutic intervention in order to improve the following deficits and impairments:  Decreased range of motion, Decreased strength, Impaired UE functional use, Pain, Hypomobility, Impaired perceived functional ability  Visit Diagnosis: Chronic left shoulder pain  Chronic right shoulder pain  Stiffness of right shoulder, not elsewhere classified  Stiffness of left shoulder, not elsewhere classified     Problem List There are no active problems to display for this patient.  Phillips Grout PT, DPT, GCS  Haley Lucas 05/02/2018, 1:05 PM  [place] MAIN [facility] SERVICES [address] [place], [place], [address] Phone: [phone]   Fax:  [phone]  Name: Haley Lucas MRN: [id number] Date of Birth: 11-07-1950

## 2018-05-08 ENCOUNTER — Ambulatory Visit: Payer: Medicare Other

## 2018-05-08 VITALS — BP 149/69 | HR 64

## 2018-05-08 DIAGNOSIS — M25512 Pain in left shoulder: Principal | ICD-10-CM

## 2018-05-08 DIAGNOSIS — M25611 Stiffness of right shoulder, not elsewhere classified: Secondary | ICD-10-CM

## 2018-05-08 DIAGNOSIS — G8929 Other chronic pain: Secondary | ICD-10-CM

## 2018-05-08 DIAGNOSIS — M25511 Pain in right shoulder: Secondary | ICD-10-CM

## 2018-05-08 DIAGNOSIS — M25612 Stiffness of left shoulder, not elsewhere classified: Secondary | ICD-10-CM

## 2018-05-08 NOTE — Therapy (Addendum)
[place] MAIN [facility] SERVICES [address] [place], [place], [address] Phone: [phone]   Fax:  [phone]  Physical Therapy Treatment/Goal Update  Patient Details  Name: Haley Lucas MRN: [id number] Date of Birth: [birth year] Referring Provider (PT): Neomia Dear   Encounter Date: 05/08/2018  PT End of Session - 05/08/18 1532    Visit Number  19    Number of Visits  25    Date for PT Re-Evaluation  05/15/18    Authorization Type  goals updated: 05/08/18     PT Start Time  1530    PT Stop Time  1600    PT Time Calculation (min)  30 min    Activity Tolerance  Patient tolerated treatment well    Behavior During Therapy  [facility] for tasks assessed/performed       Past Medical History:  Diagnosis Date  . High cholesterol   . Hypertension   . Thyroid disease     History reviewed. No pertinent surgical history.  Vitals:   05/08/18 1537  BP: (!) 149/69  Pulse: 64  SpO2: 98%    Subjective Assessment - 05/08/18 1531    Subjective  Pt reports that she is doing well today. She denies bilateral shoulder pain upon arrival today. No further headaches. No specific questions or concerns upon arrival. She reports that she didn't perform any of the desensitization exercises that were provided to her at the last session.     Pertinent History  L shoulder pain starting Feb 2019, R shoulder pain starting June 2019. No confirmed MOI, but family member reported pain and stiffness is 2/2 to recovery s/p acoustic neuroma.    Limitations  Lifting;House hold activities    How long can you sit comfortably?  NA    How long can you stand comfortably?  >30 minutes    How long can you walk comfortably?  >1038f    Diagnostic tests  Xrays of B shoulder (9/27)- normal    Patient Stated Goals  reduce shoulder pain,     Currently in Pain?  No/denies           TREATMENT   Ther-ex SciFit UBE x475mutes for warm-up unbilled; UE Ranger shoulder  flexion x 20 bilateral; UE Ranger shoulder abduction x 20 bilateral; UE Ranger shoulder circles x 20 CW, x 20 CCW; Pt completed QuickDASH with assist from therapist, pt scored 63.6% impaired; Updated outcome measures and goals with patient, performed MMT and AAROM measures (see below)  MMT  R/L: Shoulder flexion 4/4, abduction: 4/4, ER (in sitting): 4/4, IR (in sitting): 4/4  Bilateral shoulder AAROM Flexion: R: 160, L: 152, Abduction: R: 115, L: 130, ER: R: 80, L: 79, IR: R: 65, L: 70, HBH: R: T3 L: T3, HBB: R: T12, L: T12    Pt educated throughout session about proper posture and technique with exercises. Improved exercise technique, movement at target joints, use of target muscles after min to mod verbal, visual, tactile cues.   Pt arrived late for her appointment so session was abbreviated accordingly. Outcome measures and goals updated with patient during session. Her B shoulder strength is unchanged since the last time it was tested. Her shoulder AAROM is roughly similar to the last time is was measured and is functional for all ADLs/IADLs. She denies any limitations in ADLs with the exception of needing help washing her back. She also reports inability to clean her house however she is unable to clarify why this is  the case as she has adequate shoulder ROM and strength to perform most typical tasks around the house. Her worst shoulder pain is improved by 1 point on the NPRS since the last time it was reported which is note a clinically significant change. Her QuickDASH is worse today than it was the last time she completed it. Pt is not making any progress at this time and she will be discharged at her next visit with a home program to continue independently.                                 PT Short Term Goals - 05/08/18 1540      PT SHORT TERM GOAL #1   Title  Patient will be able to achieve B active assisted shoulder flexion >140 degrees without  pain in order to promote functional independence with ADLs.    Baseline  L 124, R 133; 02/26/18: L , R: 160, L: 150; 05/08/18: Bilateral shoulder AAROM: Flexion: R: 160, L: 152, Abduction: R: 115, L: 130, ER: R: 80, L: 79, IR: R: 65, L: 70, HBH: R: T3 L: T3, HBB: R: T12, L: T12     Time  2    Period  Weeks    Status  Achieved      PT SHORT TERM GOAL #2   Title  Patient will report a worst pain of 4/10 on NPRS in order to improve tolerance with ADLs and reduced symptoms with activities.     Baseline  7/10; 02/26/18: worst pain: 7/10 R shoulder, 6/10 L shoulder; 03/31/18: 6/10; 05/08/18: 5/10 R shoulder, 4/10 L shoulder    Time  2    Period  Weeks    Status  Partially Met        PT Long Term Goals - 05/08/18 1541      PT LONG TERM GOAL #1   Title  Patient will report a worst pain of 0/10 on NPRS in order to improve tolerance with ADLs and reduced symptoms with activities.     Baseline  7/10; 02/26/18: worst pain: 7/10 R shoulder, 6/10 L shoulder; 03/31/18: 6/10 bilateral shoulder pain at worst; 05/08/18: 5/10 R shoulder, 4/10 L shoulder    Time  6    Period  Weeks    Status  Partially Met    Target Date  05/15/18      PT LONG TERM GOAL #2   Title  Patient will increase BUE gross strength to 4+/5 in order to improve functional strength and promote independence with all ADLs.    Baseline  3-/5; 02/26/18: MMT R/L: Shoulder flexion 4/4, abduction: 4/4-, ER (in sitting): 4/4, IR (in sitting): 4/4; 02/26/18: MMT R/L: Shoulder flexion 4/4, abduction: 4/4-, ER (in sitting): 4/4, IR (in sitting): 4/4; 02/26/18: MMT R/L: Shoulder flexion 4/4, abduction: 4/4, ER (in sitting): 4/4, IR (in sitting): 4/4; 03/31/18: MMT R/L: Shoulder flexion 4/4, abduction: 4/4, ER (in sitting): 4/4, IR (in sitting): 4/4; 05/08/18: MMT R/L: Shoulder flexion 4/4, abduction: 4/4, ER (in sitting): 4/4, IR (in sitting): 4/4    Time  6    Period  Weeks    Status  Partially Met    Target Date  05/15/18      PT LONG TERM GOAL  #3   Title  Patient will be independent with all upper body hygiene and dressing pain-free in order to promote independence.    Baseline  Unable; 02/26/18:  Pt is independent    Time  6    Period  Weeks    Status  Achieved      PT LONG TERM GOAL #4   Title  Patient will improve Quick DASH score from 54% (moderate disability) to at most 46% in order to improve functional ability in ADLs.    Baseline  54% (moderate disability); 02/26/18: 56.82%; 03/31/18: 59.1%; 05/09/18: 63.6%    Time  6    Period  Weeks    Status  On-going    Target Date  05/15/18            Plan - 05/08/18 1532    Clinical Impression Statement  Pt arrived late for her appointment so session was abbreviated accordingly. Outcome measures and goals updated with patient during session. Her B shoulder strength is unchanged since the last time it was tested. Her shoulder AAROM is roughly similar to the last time is was measured and is functional for all ADLs/IADLs. She denies any limitations in ADLs with the exception of needing help washing her back. She also reports inability to clean her house however she is unable to clarify why this is the case as she has adequate shoulder ROM and strength to perform most typical tasks around the house. Her worst shoulder pain is improved by 1 point on the NPRS since the last time it was reported which is note a clinically significant change. Her QuickDASH is worse today than it was the last time she completed it. Pt is not making any progress at this time and she will be discharged at her next visit with a home program to continue independently.    Rehab Potential  Good    Clinical Impairments Affecting Rehab Potential  pain, decreased ROM, strength deficits    PT Frequency  2x / week    PT Duration  6 weeks    PT Treatment/Interventions  Ultrasound;Moist Heat;Cryotherapy;Therapeutic activities;Therapeutic exercise;Neuromuscular re-education;Patient/family education;Manual techniques;Joint  Manipulations;Dry needling;Taping    PT Next Visit Plan  Discharge, review HEP, ROM, manual interventions, and strengthening    PT Home Exercise Plan  pulleys in scaption and flexion    Consulted and Agree with Plan of Care  Patient;Family member/caregiver    Family Member Consulted  Daughter [name])       Patient will benefit from skilled therapeutic intervention in order to improve the following deficits and impairments:  Decreased range of motion, Decreased strength, Impaired UE functional use, Pain, Hypomobility, Impaired perceived functional ability  Visit Diagnosis: Chronic left shoulder pain  Chronic right shoulder pain  Stiffness of right shoulder, not elsewhere classified  Stiffness of left shoulder, not elsewhere classified     Problem List There are no active problems to display for this patient.   Gaige Sebo 05/09/2018, 9:16 PM  [place] MAIN [facility] SERVICES [address] [place], [place], [address] Phone: [phone]   Fax:  [phone]  Name: AYA GEISEL MRN: [id number] Date of Birth: 08-18-1950

## 2018-05-15 ENCOUNTER — Ambulatory Visit: Payer: Medicare Other

## 2018-05-15 DIAGNOSIS — M25612 Stiffness of left shoulder, not elsewhere classified: Secondary | ICD-10-CM

## 2018-05-15 DIAGNOSIS — M25512 Pain in left shoulder: Principal | ICD-10-CM

## 2018-05-15 DIAGNOSIS — M25611 Stiffness of right shoulder, not elsewhere classified: Secondary | ICD-10-CM

## 2018-05-15 DIAGNOSIS — M25511 Pain in right shoulder: Secondary | ICD-10-CM

## 2018-05-15 DIAGNOSIS — G8929 Other chronic pain: Secondary | ICD-10-CM

## 2018-05-15 NOTE — Therapy (Signed)
[place] MAIN [facility] SERVICES [address] [place], [place], [address] Phone: [phone]   Fax:  [phone]  Physical Therapy Progress Note/Discharge Summary   Dates of reporting period 03/21/19     to   05/15/18  Patient Details  Name: Haley Lucas MRN: [id number] Date of Birth: 04-16-1951 Referring Provider (PT): Neomia Dear   Encounter Date: 05/15/2018  PT End of Session - 05/15/18 1540    Visit Number  20    Number of Visits  25    Date for PT Re-Evaluation  05/15/18    Authorization Type  goals updated: 05/08/18     PT Start Time  1520    PT Stop Time  1600    PT Time Calculation (min)  40 min    Activity Tolerance  Patient tolerated treatment well    Behavior During Therapy  [facility] for tasks assessed/performed       Past Medical History:  Diagnosis Date  . High cholesterol   . Hypertension   . Thyroid disease     History reviewed. No pertinent surgical history.  There were no vitals filed for this visit.  Subjective Assessment - 05/15/18 1540    Subjective  Pt reports that she is doing well today. She denies bilateral shoulder pain upon arrival today. No further headaches. No specific questions or concerns upon arrival. She reports that she is performing pulleys and scapular retractions at home however didn't perform any of the desensitization exercises for her face.    Pertinent History  L shoulder pain starting Feb 2019, R shoulder pain starting June 2019. No confirmed MOI, but family member reported pain and stiffness is 2/2 to recovery s/p acoustic neuroma.    Limitations  Lifting;House hold activities    How long can you sit comfortably?  NA    How long can you stand comfortably?  >30 minutes    How long can you walk comfortably?  >1071f    Diagnostic tests  Xrays of B shoulder (9/27)- normal    Patient Stated Goals  reduce shoulder pain,     Currently in Pain?  No/denies        TREATMENT   Ther-ex SciFit  UBE x430mutes for warm-up unbilled; UE Ranger shoulder flexion x 20 bilateral; UE Ranger shoulder abduction x 20 bilateral; UE Ranger shoulder circles x 20 CW, x 20 CCW; Rows in standingwith green tband 2 x 15 with cues for scapular retraction; Wall push ups 2 x 15 with cues for proper form; Standing shoulder extension with green tband x 15 bilateral; Standing shoulder ER with green tband x 15 bilateral; Pulleys in sitting for flexion and then abduction x 30 each bilateral with 3s hold at top, pt requires significant cues for correct technique as she is moving very quickly initially; Pt provided consolidated HEP with extensive instruction about how to complete correctly.   Pt educated throughout session about proper posture and technique with exercises. Improved exercise technique, movement at target joints, use of target muscles after min to mod verbal, visual, tactile cues.    Outcome measures and goals were updated at last visit with patient. Her B shoulder strength is unchanged since the last time it was tested. Her shoulder AAROM is roughly similar to the last time it was measured and is functional for all ADLs/IADLs. She denies any limitations in ADLs with the exception of needing help washing her back. She also reports inability to clean her house however she is  unable to clarify why this is the case as she has adequate shoulder ROM and strength to perform most typical tasks around the house. Her worst shoulder pain has improved by 1 point on the NPRS since the last time it was reported which is not a clinically significant change. Her QuickDASH was worse than it was the last time she completed it. Pt is not making any progress at this time and she will be discharged on this date. Extensive home exercise program provided and practiced with patient during session today. Pt encouraged to contact clinic if she has any further questions or concerns.                  PT Short  Term Goals - 05/08/18 1540      PT SHORT TERM GOAL #1   Title  Patient will be able to achieve B active assisted shoulder flexion >140 degrees without pain in order to promote functional independence with ADLs.    Baseline  L 124, R 133; 02/26/18: L , R: 160, L: 150; 05/08/18: Bilateral shoulder AAROM: Flexion: R: 160, L: 152, Abduction: R: 115, L: 130, ER: R: 80, L: 79, IR: R: 65, L: 70, HBH: R: T3 L: T3, HBB: R: T12, L: T12     Time  2    Period  Weeks    Status  Achieved      PT SHORT TERM GOAL #2   Title  Patient will report a worst pain of 4/10 on NPRS in order to improve tolerance with ADLs and reduced symptoms with activities.     Baseline  7/10; 02/26/18: worst pain: 7/10 R shoulder, 6/10 L shoulder; 03/31/18: 6/10; 05/08/18: 5/10 R shoulder, 4/10 L shoulder    Time  2    Period  Weeks    Status  Partially Met        PT Long Term Goals - 05/08/18 1541      PT LONG TERM GOAL #1   Title  Patient will report a worst pain of 0/10 on NPRS in order to improve tolerance with ADLs and reduced symptoms with activities.     Baseline  7/10; 02/26/18: worst pain: 7/10 R shoulder, 6/10 L shoulder; 03/31/18: 6/10 bilateral shoulder pain at worst; 05/08/18: 5/10 R shoulder, 4/10 L shoulder    Time  6    Period  Weeks    Status  Partially Met    Target Date  05/15/18      PT LONG TERM GOAL #2   Title  Patient will increase BUE gross strength to 4+/5 in order to improve functional strength and promote independence with all ADLs.    Baseline  3-/5; 02/26/18: MMT R/L: Shoulder flexion 4/4, abduction: 4/4-, ER (in sitting): 4/4, IR (in sitting): 4/4; 02/26/18: MMT R/L: Shoulder flexion 4/4, abduction: 4/4-, ER (in sitting): 4/4, IR (in sitting): 4/4; 02/26/18: MMT R/L: Shoulder flexion 4/4, abduction: 4/4, ER (in sitting): 4/4, IR (in sitting): 4/4; 03/31/18: MMT R/L: Shoulder flexion 4/4, abduction: 4/4, ER (in sitting): 4/4, IR (in sitting): 4/4; 05/08/18: MMT R/L: Shoulder flexion 4/4, abduction:  4/4, ER (in sitting): 4/4, IR (in sitting): 4/4    Time  6    Period  Weeks    Status  Partially Met    Target Date  05/15/18      PT LONG TERM GOAL #3   Title  Patient will be independent with all upper body hygiene and dressing pain-free in order to promote independence.  Baseline  Unable; 02/26/18: Pt is independent    Time  6    Period  Weeks    Status  Achieved      PT LONG TERM GOAL #4   Title  Patient will improve Quick DASH score from 54% (moderate disability) to at most 46% in order to improve functional ability in ADLs.    Baseline  54% (moderate disability); 02/26/18: 56.82%; 03/31/18: 59.1%; 05/09/18: 63.6%    Time  6    Period  Weeks    Status  On-going    Target Date  05/15/18            Plan - 05/15/18 1540    Clinical Impression Statement  Outcome measures and goals were updated at last visit with patient. Her B shoulder strength is unchanged since the last time it was tested. Her shoulder AAROM is roughly similar to the last time it was measured and is functional for all ADLs/IADLs. She denies any limitations in ADLs with the exception of needing help washing her back. She also reports inability to clean her house however she is unable to clarify why this is the case as she has adequate shoulder ROM and strength to perform most typical tasks around the house. Her worst shoulder pain has improved by 1 point on the NPRS since the last time it was reported which is not a clinically significant change. Her QuickDASH was worse than it was the last time she completed it. Pt is not making any progress at this time and she will be discharged on this date. Extensive home exercise program provided and practiced with patient during session today. Pt encouraged to contact clinic if she has any further questions or concerns.     Rehab Potential  Good    Clinical Impairments Affecting Rehab Potential  pain, decreased ROM, strength deficits    PT Frequency  2x / week    PT  Duration  6 weeks    PT Treatment/Interventions  Ultrasound;Moist Heat;Cryotherapy;Therapeutic activities;Therapeutic exercise;Neuromuscular re-education;Patient/family education;Manual techniques;Joint Manipulations;Dry needling;Taping    PT Next Visit Plan  Discharge    PT Home Exercise Plan  pulleys in scaption and flexion, scapular retractions,     Consulted and Agree with Plan of Care  Patient;Family member/caregiver    Family Member Consulted  Daughter Haley Lucas)       Patient will benefit from skilled therapeutic intervention in order to improve the following deficits and impairments:  Decreased range of motion, Decreased strength, Impaired UE functional use, Pain, Hypomobility, Impaired perceived functional ability  Visit Diagnosis: Chronic left shoulder pain  Chronic right shoulder pain  Stiffness of right shoulder, not elsewhere classified  Stiffness of left shoulder, not elsewhere classified     Problem List There are no active problems to display for this patient.  Phillips Grout PT, DPT, GCS  Haley Lucas 05/16/2018, 3:21 PM  [place] MAIN [facility] SERVICES [address] [place], [place], [address] Phone: [phone]   Fax:  [phone]  Name: Haley Lucas MRN: [id number] Date of Birth: 1951/04/05

## 2018-05-15 NOTE — Patient Instructions (Signed)
Access Code: [email or web address]  URL: [email or web address]  Date: 05/15/2018  Prepared by: Ria Comment   Exercises  Seated Shoulder Flexion AAROM with Pulley in Front - 30 reps - 1 sets - 3 seconds hold - 1x daily - 7x weekly  Seated Shoulder Abduction AAROM with Pulley at Side - 30 reps - 1 sets - 3 seconds hold - 1x daily - 7x weekly  Standing Row with Resistance - 10 reps - 2 sets - 3 seconds hold - 1x daily - 7x weekly  Shoulder External Rotation with Anchored Resistance - 10 reps - 2 sets - 3 seconds hold - 1x daily - 7x weekly  Wall Push Up - 10 reps - 2 sets - 1x daily - 7x weekly

## 2021-07-24 ENCOUNTER — Other Ambulatory Visit: Payer: Self-pay

## 2021-07-24 ENCOUNTER — Emergency Department
Admission: EM | Admit: 2021-07-24 | Discharge: 2021-07-24 | Disposition: A | Discharge status: Home / Self Care | Payer: Medicare Other | Attending: Emergency Medicine | Admitting: Emergency Medicine

## 2021-07-24 ENCOUNTER — Emergency Department: Payer: Medicare Other

## 2021-07-24 DIAGNOSIS — Y92008 Other place in unspecified non-institutional (private) residence as the place of occurrence of the external cause: Secondary | ICD-10-CM | POA: Insufficient documentation

## 2021-07-24 DIAGNOSIS — S50871A Other superficial bite of right forearm, initial encounter: Secondary | ICD-10-CM | POA: Insufficient documentation

## 2021-07-24 DIAGNOSIS — I10 Essential (primary) hypertension: Secondary | ICD-10-CM | POA: Diagnosis not present

## 2021-07-24 DIAGNOSIS — S60572A Other superficial bite of hand of left hand, initial encounter: Secondary | ICD-10-CM | POA: Diagnosis not present

## 2021-07-24 DIAGNOSIS — Y999 Unspecified external cause status: Secondary | ICD-10-CM | POA: Insufficient documentation

## 2021-07-24 DIAGNOSIS — S41151A Open bite of right upper arm, initial encounter: Secondary | ICD-10-CM | POA: Diagnosis present

## 2021-07-24 DIAGNOSIS — W540XXA Bitten by dog, initial encounter: Secondary | ICD-10-CM | POA: Diagnosis not present

## 2021-07-24 DIAGNOSIS — Y9389 Activity, other specified: Secondary | ICD-10-CM | POA: Diagnosis not present

## 2021-07-24 MED ORDER — AMOXICILLIN-POT CLAVULANATE 875-125 MG PO TABS
1.0000 | ORAL_TABLET | Freq: Two times a day (BID) | ORAL | 0 refills | Status: AC
Start: 2021-07-24 — End: 2021-07-31

## 2021-07-24 MED ORDER — OXYCODONE-ACETAMINOPHEN 5-325 MG PO TABS
1.0000 | ORAL_TABLET | Freq: Once | ORAL | Status: AC
  Administered 2021-07-24: 1 via ORAL
  Filled 2021-07-24: qty 1

## 2021-07-24 MED ORDER — BACITRACIN-NEOMYCIN-POLYMYXIN 400-5-5000 EX OINT
TOPICAL_OINTMENT | Freq: Once | CUTANEOUS | Status: AC
  Filled 2021-07-24: qty 2

## 2021-07-24 NOTE — ED Provider Notes (Signed)
? ?[facility] ?Provider Note ? ? ? Event Date/Time  ? First MD Initiated Contact with Patient 07/24/21 1131   ?  (approximate) ? ? ?History  ? ?Animal Bite ? ? ?HPI ? ?Haley Lucas is a 71 y.o. female with history of hypertension and high cholesterol along with brain tumor presents emergency department after a dog bite.  They got a dog 4 days ago from an adoption agency.  Immunizations are up-to-date.  Patient states the dog likes her husband but obviously does not like her.  Dog bit her right upper arm, right lower arm, left hand.  Patient states she sees her regular doctor on a regular basis and thinks that her Tdap is up-to-date. ? ?  ? ? ?Physical Exam  ? ?Triage Vital Signs: ?ED Triage Vitals  ?Enc Vitals Group  ?   BP 07/24/21 1125 (!) 178/85  ?   Pulse Rate 07/24/21 1125 77  ?   Resp 07/24/21 1125 20  ?   Temp 07/24/21 1125 98.7 ?F (37.1 ?C)  ?   Temp Source 07/24/21 1125 Oral  ?   SpO2 07/24/21 1125 98 %  ?   Weight 07/24/21 1156 125 lb (56.7 kg)  ?   Height 07/24/21 1156 5\' 4"  (1.626 m)  ?   Head Circumference --   ?   Peak Flow --   ?   Pain Score --   ?   Pain Loc --   ?   Pain Edu? --   ?   Excl. in GC? --   ? ? ?Most recent vital signs: ?Vitals:  ? 07/24/21 1125 07/24/21 1256  ?BP: (!) 178/85 (!) 160/80  ?Pulse: 77 78  ?Resp: 20 18  ?Temp: 98.7 ?F (37.1 ?C)   ?SpO2: 98% 100%  ? ? ? ?General: Awake, no distress.   ?CV:  Good peripheral perfusion. regular rate and  rhythm ?Resp:  Normal effort.  ?Abd:  No distention.   ?Other:  Bruising and abrasions noted to the right forearm along with swelling, right upper arm has deeper laceration noted with active bleeding, area is bruised and swollen, left hand has an abrasion/bite mark ? ? ?ED Results / Procedures / Treatments  ? ?Labs ?(all labs ordered are listed, but only abnormal results are displayed) ?Labs Reviewed - No data to display ? ? ?EKG ? ? ? ? ?RADIOLOGY ?X-ray of the right forearm, right humerus, left  hand ? ? ? ?PROCEDURES: ? ? ?Procedures ? ? ?MEDICATIONS ORDERED IN ED: ?Medications  ?neomycin-bacitracin-polymyxin (NEOSPORIN) ointment packet ( Topical Given 07/24/21 1254)  ?oxyCODONE-acetaminophen (PERCOCET/ROXICET) 5-325 MG per tablet 1 tablet (1 tablet Oral Given 07/24/21 1254)  ? ? ? ?IMPRESSION / MDM / ASSESSMENT AND PLAN / ED COURSE  ?I reviewed the triage vital signs and the nursing notes. ?             ?               ? ?Differential diagnosis includes, but is not limited to, dog bite, foreign body, fracture, contusion, laceration ? ?X-ray of the right humerus, right forearm, and left hand were all independently reviewed by me, no foreign body or fracture noted ? ?I did explain these findings to the patient.  All areas were cleaned with normal saline.  Neosporin was applied to the deeper wounds on the right upper extremity, dressings were applied to the wounds. ? ?The patient is to take Augmentin twice daily for 7 days.  Wash the area very gently with soap and water.  Return emergency department if worsening.  She was discharged stable condition. ? ? ?  ? ? ?FINAL CLINICAL IMPRESSION(S) / ED DIAGNOSES  ? ?Final diagnoses:  ?Dog bite, initial encounter  ? ? ? ?Rx / DC Orders  ? ?ED Discharge Orders   ? ?      Ordered  ?  amoxicillin-clavulanate (AUGMENTIN) 875-125 MG tablet  2 times daily       ? 07/24/21 1240  ? ?  ?  ? ?  ? ? ? ?Note:  This document was prepared using Dragon voice recognition software and may include unintentional dictation errors. ? ?  ?Faythe Ghee, PA-C ?07/24/21 1540 ? ?  ?Jene Every, MD ?07/25/21 1853 ? ?

## 2021-07-24 NOTE — ED Notes (Signed)
Dc instructions completed with interpreter I pad. Pt denies any questions at this time. Pt followup and RX information as well as wound care reviewed. Pt provided verbal consent for DC and assisted to lobby on foot. ?

## 2021-07-24 NOTE — ED Triage Notes (Addendum)
Pt states a dog she just got about 4 days ago bit her right FA and left hand today. Bandages in place on arrival, bleeding is controlled. ?

## 2023-04-12 IMAGING — DX DG FOREARM 2V*R*
2 series · 2 of 2 positions shown · non-contrast
Comparison: None.

CLINICAL DATA: Dog bite

EXAM:
RIGHT FOREARM - 2 VIEW

[forearm ap]
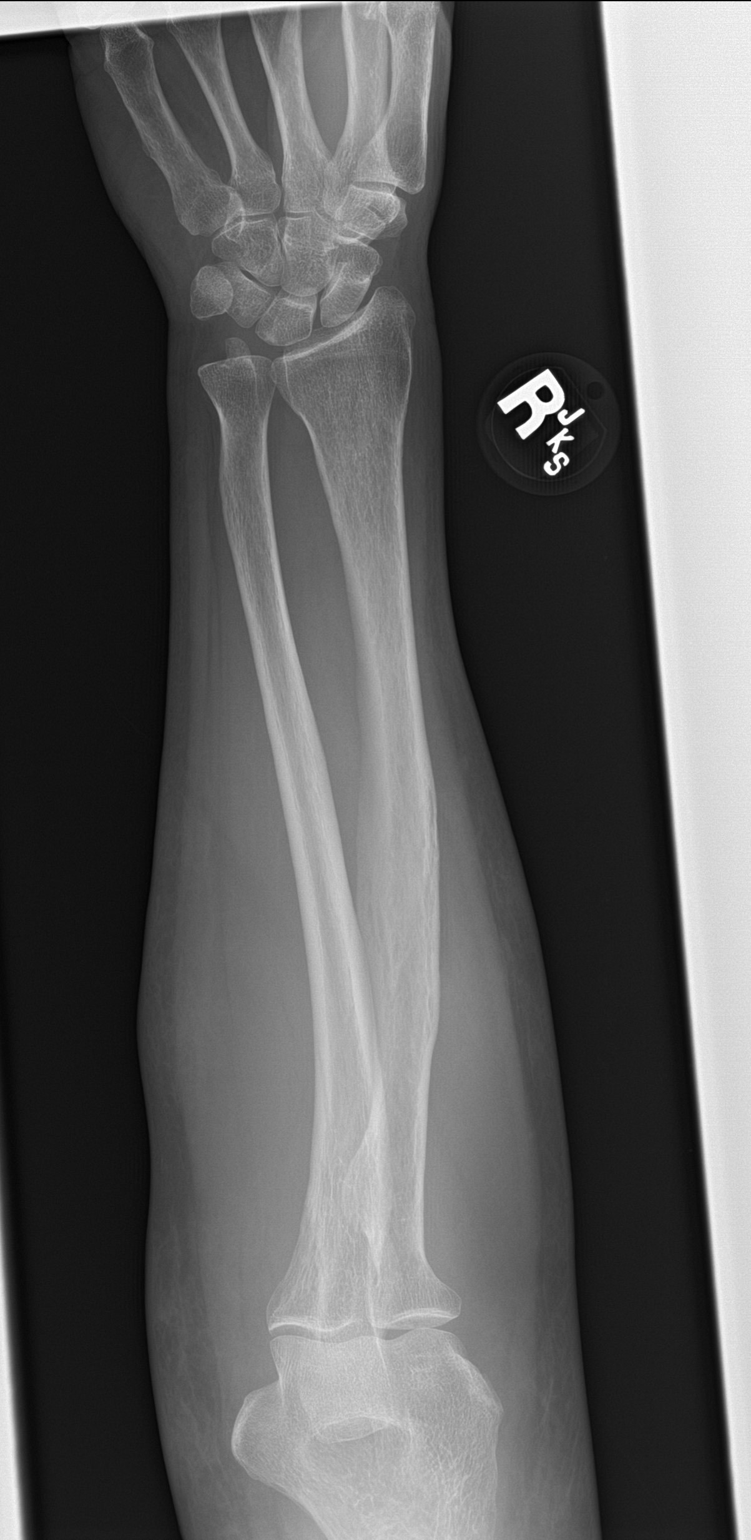

[forearm lat]
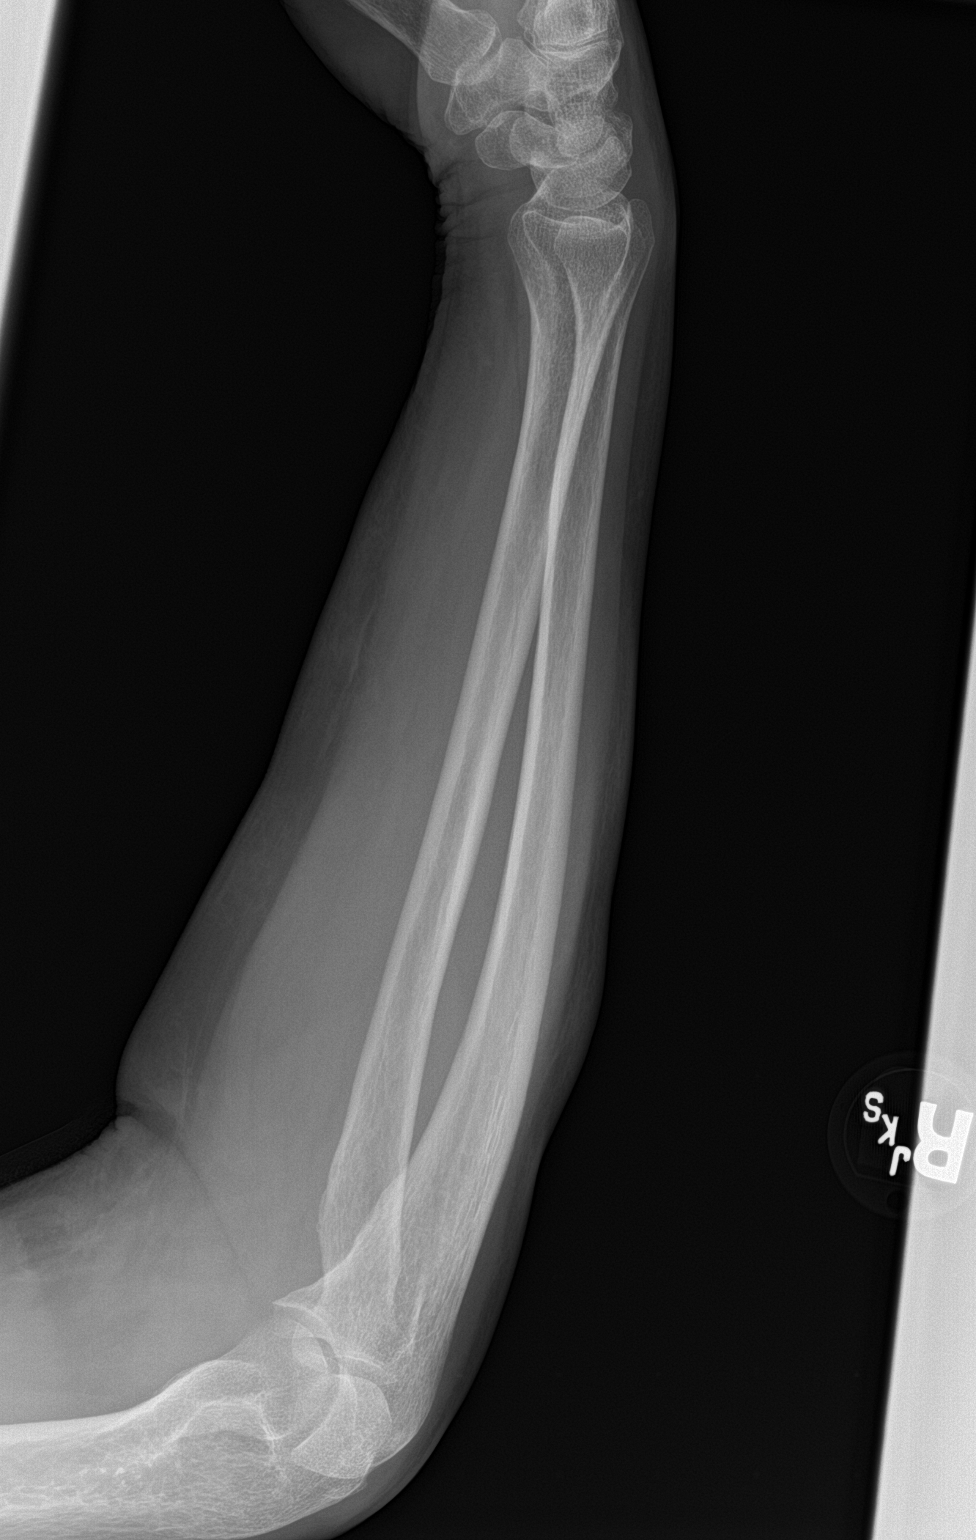

[2 of 2 positions shown; findings below may reference images not displayed]

FINDINGS: No fracture. No erosions or periosteal reaction. No focal osseous
lesions. No radiopaque foreign bodies. No malalignment on these
views. Mild soft tissue swelling about the distal right humerus on
these views.
IMPRESSION: Mild soft tissue swelling about the distal right humerus. No right
forearm fracture. No radiopaque foreign bodies.

## 2023-04-12 IMAGING — DX DG HUMERUS 2V *R*
3 series · 3 of 3 positions shown · non-contrast
Comparison: None.

CLINICAL DATA: Dog bite.

EXAM:
RIGHT HUMERUS - 2+ VIEW

[humerus ap (1 of 2)]
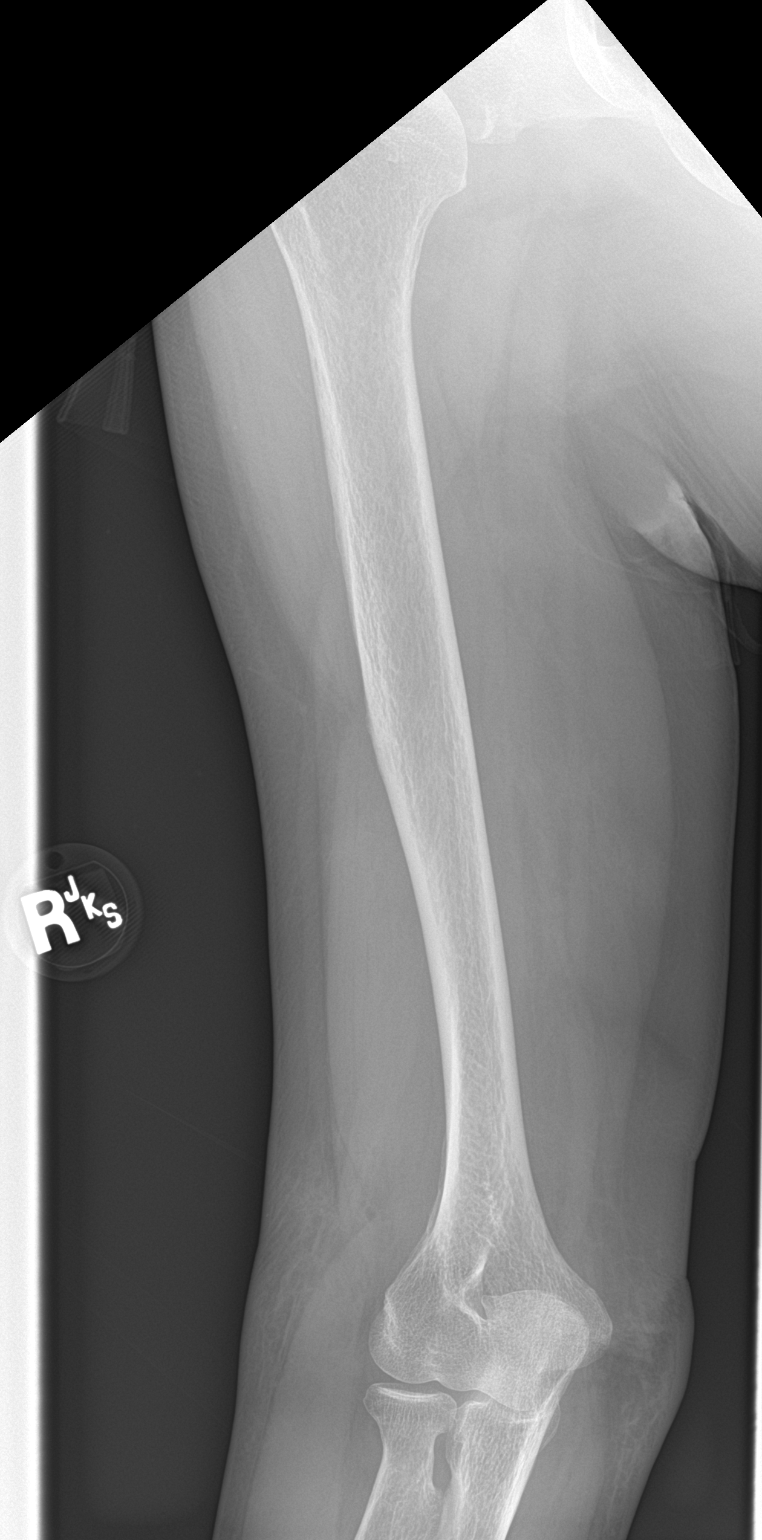

[humerus lat]
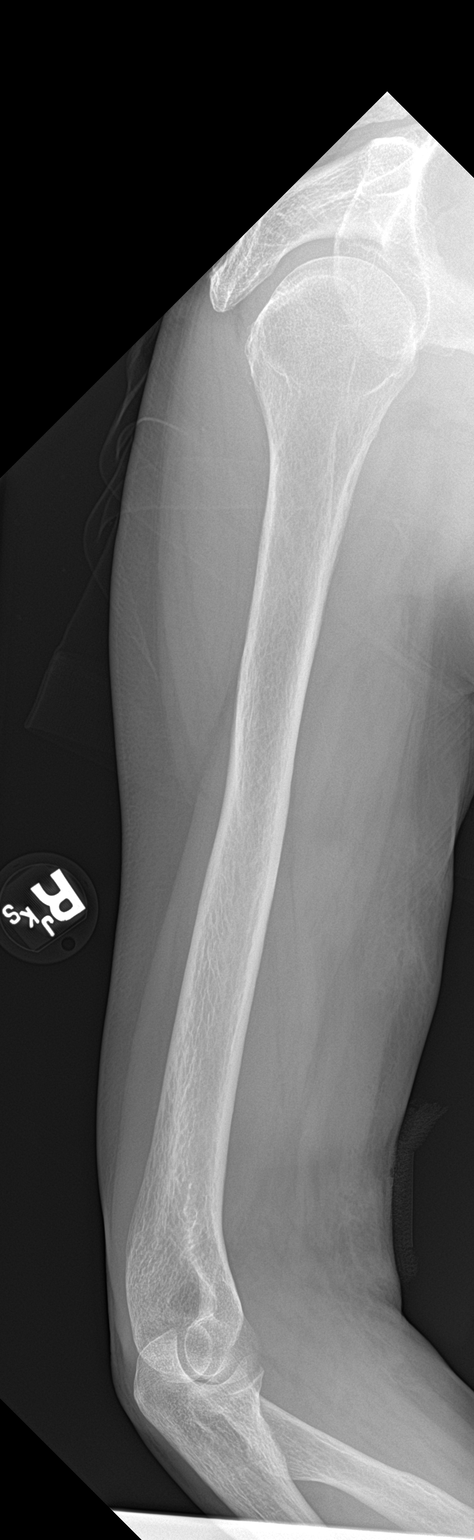

[humerus ap (2 of 2)]
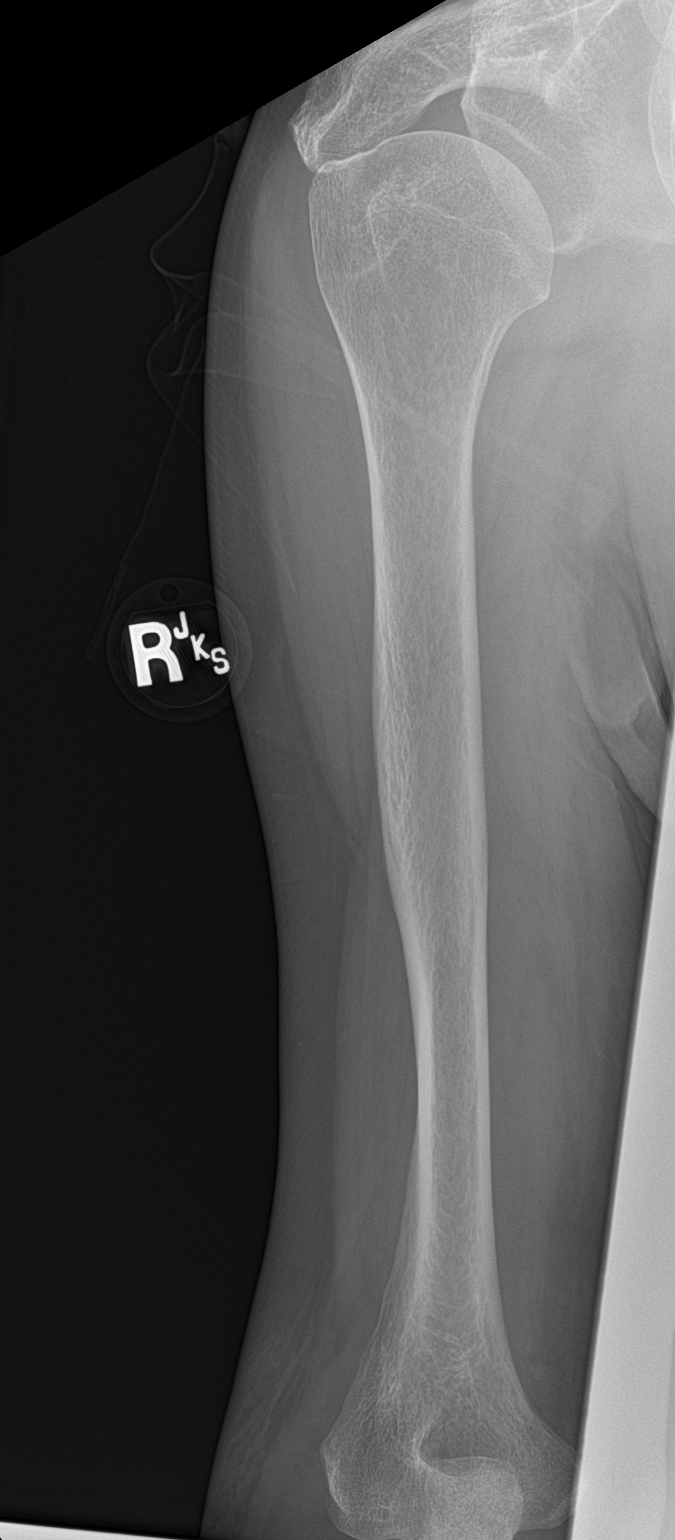

[3 of 3 positions shown; findings below may reference images not displayed]

FINDINGS: Normal bone mineralization. No acute fracture of the right humerus.
No radiopaque foreign body is seen.
IMPRESSION: Negative study.

## 2023-04-12 IMAGING — DX DG HAND COMPLETE 3+V*L*
3 series · 3 of 3 positions shown · non-contrast
Comparison: None.

CLINICAL DATA: Dog bite.

EXAM:
LEFT HAND - COMPLETE 3+ VIEW

[hand ap]
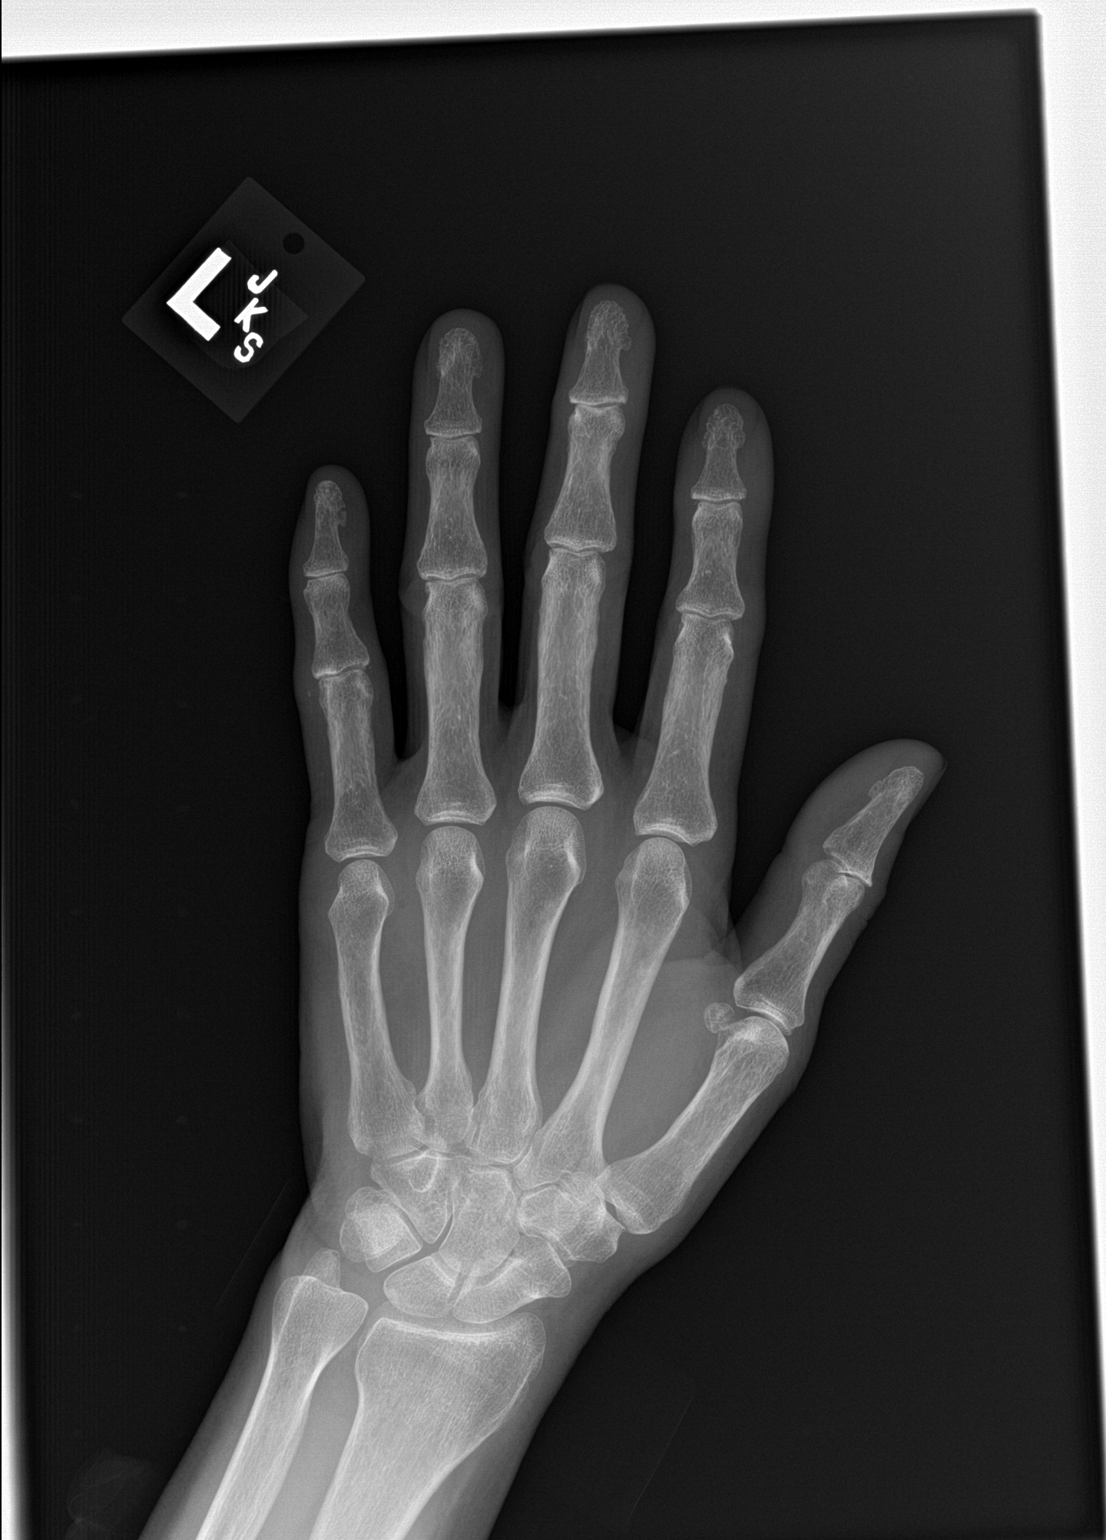

[hand obl]
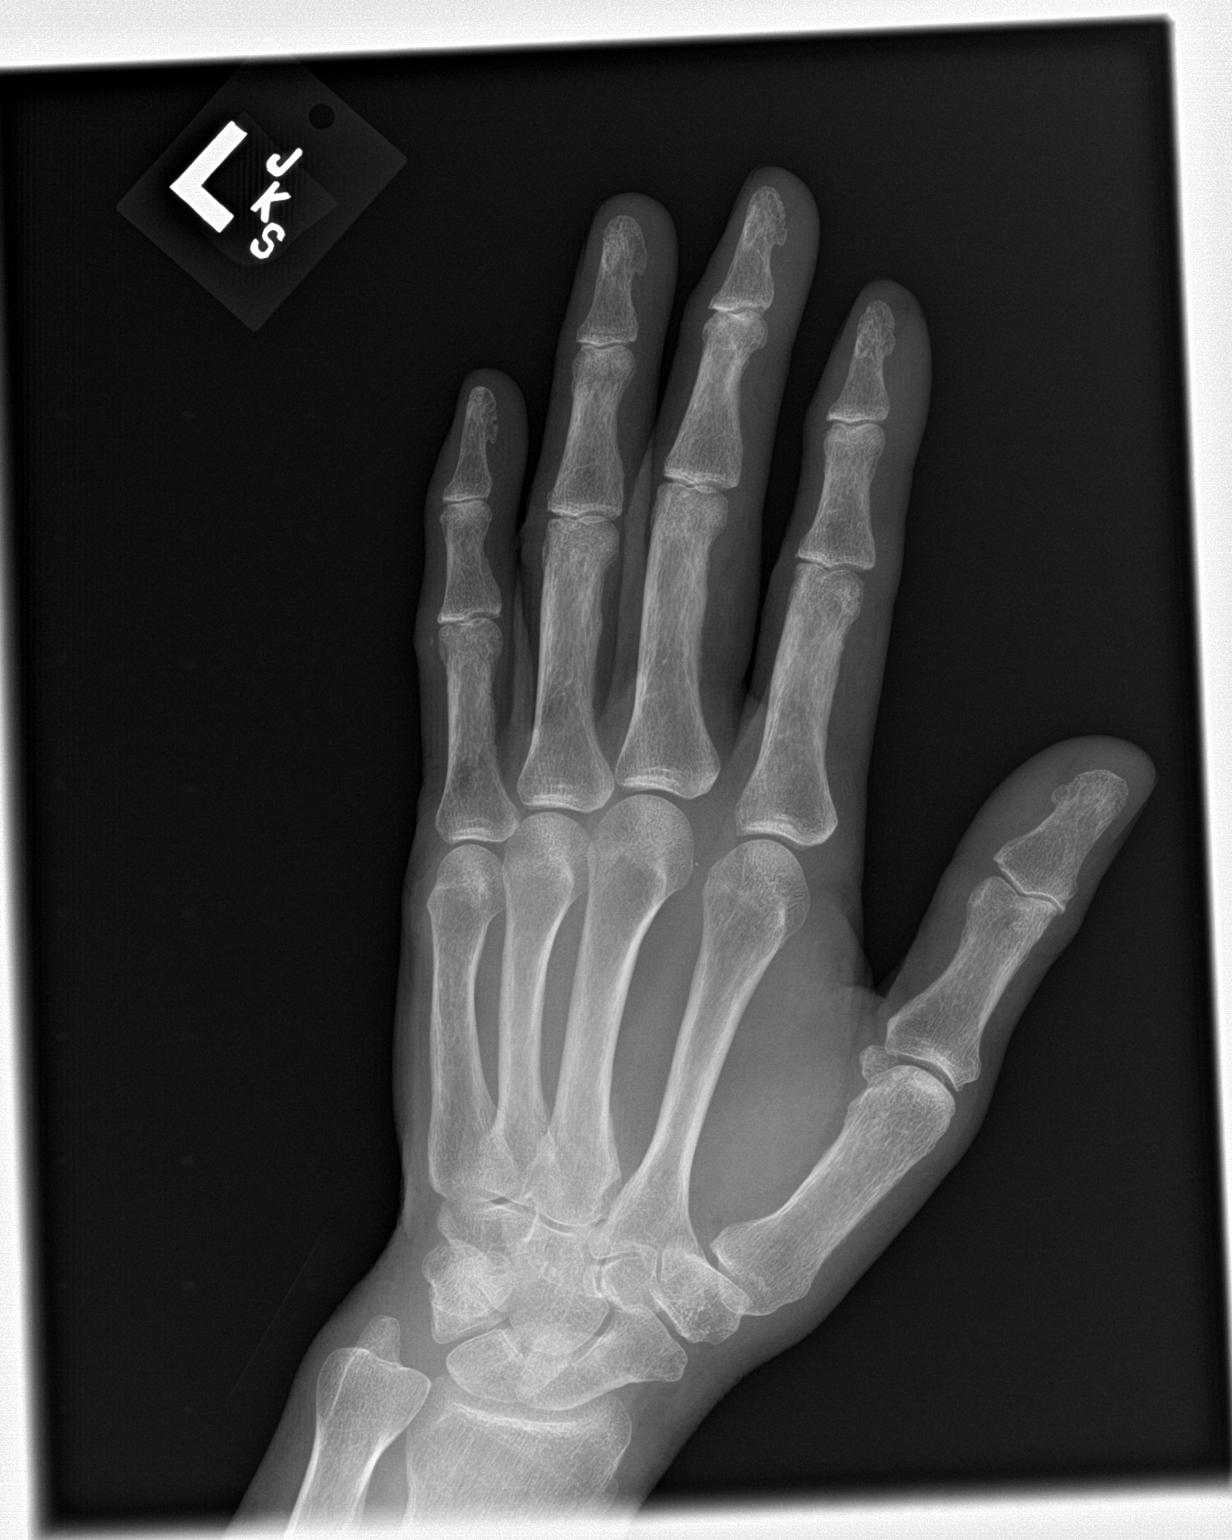

[hand lat]
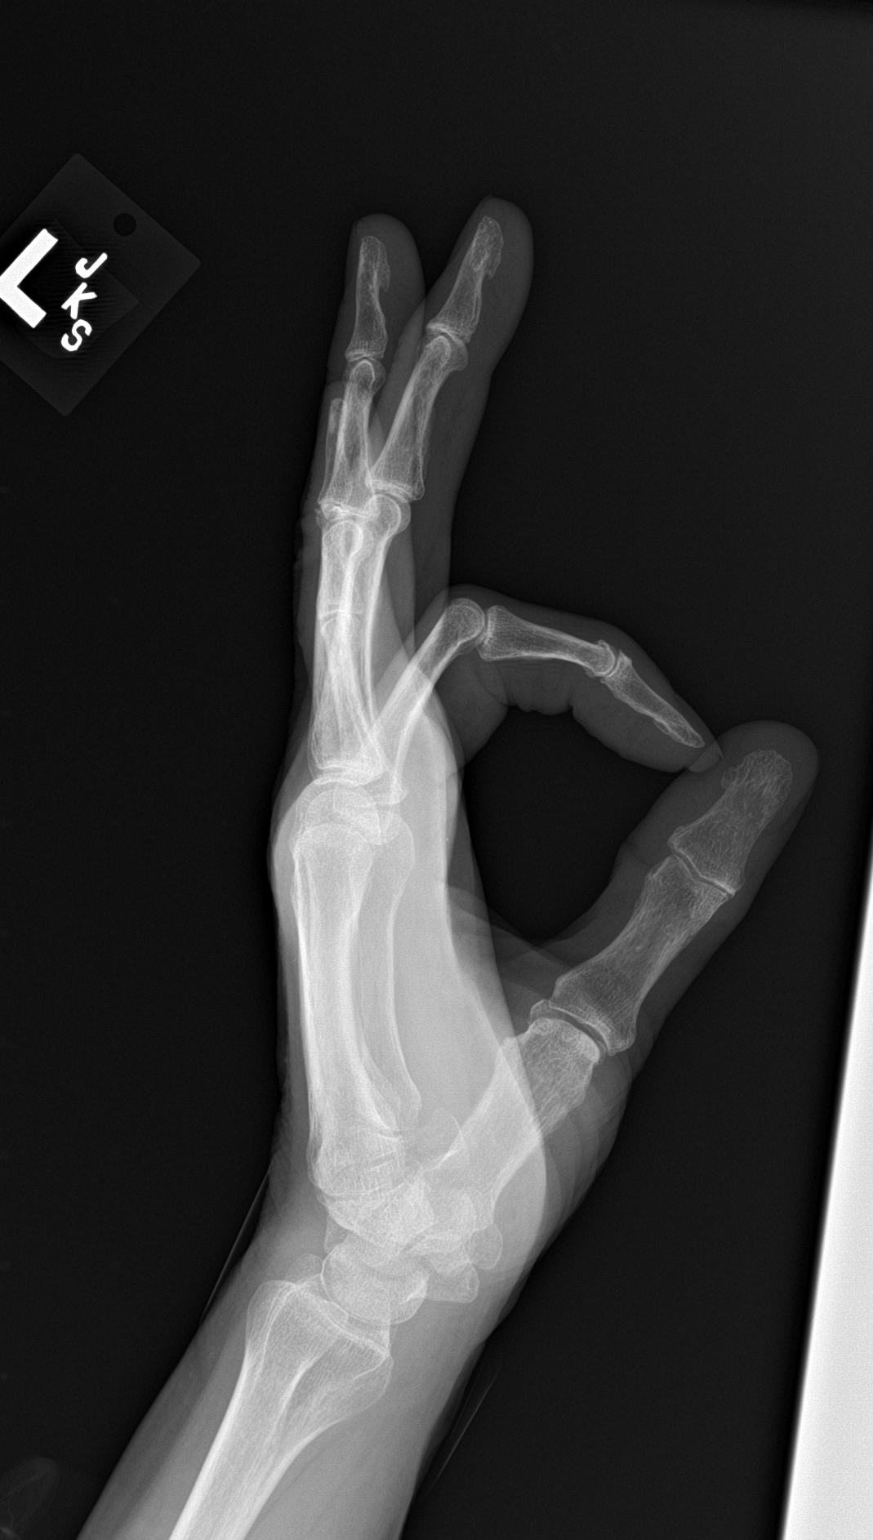

[3 of 3 positions shown; findings below may reference images not displayed]

FINDINGS: Mildly decreased bone mineralization. Mild-to-moderate joint space
narrowing of the second through fifth digit DIP joints. Mild dorsal
thumb interphalangeal joint degenerative spurring. Mild triscaphe
and thumb carpometacarpal joint space narrowing.

No acute fracture or dislocation.  No radiopaque foreign body.
IMPRESSION: No radiopaque foreign body.
